# Patient Record
Sex: Female | Born: 1950 | Race: White | Hispanic: No | Marital: Married | Smoking: Current every day smoker
Health system: Southern US, Community
[De-identification: ages and names within clinical notes are randomized; demographics above are authoritative.]

## PROBLEM LIST (undated history)

## (undated) DIAGNOSIS — K219 Gastro-esophageal reflux disease without esophagitis: Secondary | ICD-10-CM

## (undated) DIAGNOSIS — J45909 Unspecified asthma, uncomplicated: Secondary | ICD-10-CM

## (undated) DIAGNOSIS — IMO0001 Reserved for inherently not codable concepts without codable children: Secondary | ICD-10-CM

## (undated) DIAGNOSIS — F32A Depression, unspecified: Secondary | ICD-10-CM

## (undated) DIAGNOSIS — Z9981 Dependence on supplemental oxygen: Secondary | ICD-10-CM

## (undated) DIAGNOSIS — I509 Heart failure, unspecified: Secondary | ICD-10-CM

## (undated) DIAGNOSIS — G35 Multiple sclerosis: Secondary | ICD-10-CM

## (undated) DIAGNOSIS — I10 Essential (primary) hypertension: Secondary | ICD-10-CM

## (undated) DIAGNOSIS — M549 Dorsalgia, unspecified: Secondary | ICD-10-CM

## (undated) DIAGNOSIS — M542 Cervicalgia: Secondary | ICD-10-CM

## (undated) DIAGNOSIS — F329 Major depressive disorder, single episode, unspecified: Secondary | ICD-10-CM

## (undated) DIAGNOSIS — J449 Chronic obstructive pulmonary disease, unspecified: Secondary | ICD-10-CM

## (undated) DIAGNOSIS — G4730 Sleep apnea, unspecified: Secondary | ICD-10-CM

## (undated) DIAGNOSIS — G8929 Other chronic pain: Secondary | ICD-10-CM

## (undated) DIAGNOSIS — F0390 Unspecified dementia without behavioral disturbance: Secondary | ICD-10-CM

## (undated) DIAGNOSIS — F419 Anxiety disorder, unspecified: Secondary | ICD-10-CM

## (undated) HISTORY — PX: BLADDER SURGERY: SHX569

## (undated) HISTORY — PX: SHOULDER SURGERY: SHX246

## (undated) HISTORY — PX: HEMORRHOID SURGERY: SHX153

## (undated) HISTORY — PX: CARPAL TUNNEL RELEASE: SHX101

## (undated) HISTORY — DX: Unspecified dementia, unspecified severity, without behavioral disturbance, psychotic disturbance, mood disturbance, and anxiety: F03.90

## (undated) HISTORY — PX: BREAST SURGERY: SHX581

## (undated) HISTORY — DX: Multiple sclerosis: G35

## (undated) HISTORY — PX: CHOLECYSTECTOMY: SHX55

## (undated) HISTORY — PX: ABDOMINAL HYSTERECTOMY: SHX81

## (undated) HISTORY — PX: NECK SURGERY: SHX720

---

## 1999-03-23 ENCOUNTER — Ambulatory Visit: Admission: RE | Admit: 1999-03-23 | Discharge: 1999-03-23 | Payer: Self-pay | Admitting: Pulmonary Disease

## 2000-02-29 ENCOUNTER — Ambulatory Visit (HOSPITAL_BASED_OUTPATIENT_CLINIC_OR_DEPARTMENT_OTHER): Admission: RE | Admit: 2000-02-29 | Discharge: 2000-02-29 | Payer: Self-pay | Admitting: Unknown Physician Specialty

## 2000-05-02 ENCOUNTER — Ambulatory Visit (HOSPITAL_BASED_OUTPATIENT_CLINIC_OR_DEPARTMENT_OTHER): Admission: RE | Admit: 2000-05-02 | Discharge: 2000-05-02 | Payer: Self-pay | Admitting: Unknown Physician Specialty

## 2000-05-09 ENCOUNTER — Encounter: Admission: RE | Admit: 2000-05-09 | Discharge: 2000-05-09 | Payer: Self-pay | Admitting: Orthopedic Surgery

## 2000-05-11 ENCOUNTER — Ambulatory Visit (HOSPITAL_BASED_OUTPATIENT_CLINIC_OR_DEPARTMENT_OTHER): Admission: RE | Admit: 2000-05-11 | Discharge: 2000-05-11 | Payer: Self-pay | Admitting: Orthopedic Surgery

## 2000-05-31 ENCOUNTER — Encounter: Admission: RE | Admit: 2000-05-31 | Discharge: 2000-06-08 | Payer: Self-pay | Admitting: Orthopedic Surgery

## 2006-09-21 ENCOUNTER — Ambulatory Visit (HOSPITAL_COMMUNITY): Admission: RE | Admit: 2006-09-21 | Discharge: 2006-09-21 | Payer: Self-pay | Admitting: General Surgery

## 2007-01-25 ENCOUNTER — Ambulatory Visit (HOSPITAL_COMMUNITY): Admission: RE | Admit: 2007-01-25 | Discharge: 2007-01-25 | Payer: Self-pay | Admitting: Family Medicine

## 2007-09-14 HISTORY — PX: ESOPHAGOGASTRODUODENOSCOPY: SHX1529

## 2007-10-04 ENCOUNTER — Ambulatory Visit (HOSPITAL_COMMUNITY): Admission: RE | Admit: 2007-10-04 | Discharge: 2007-10-04 | Payer: Self-pay | Admitting: Family Medicine

## 2008-02-02 ENCOUNTER — Encounter: Admission: RE | Admit: 2008-02-02 | Discharge: 2008-02-02 | Payer: Self-pay | Admitting: Neurosurgery

## 2008-03-12 ENCOUNTER — Ambulatory Visit (HOSPITAL_COMMUNITY): Admission: RE | Admit: 2008-03-12 | Discharge: 2008-03-12 | Payer: Self-pay | Admitting: Orthopedic Surgery

## 2008-04-01 ENCOUNTER — Encounter: Admission: RE | Admit: 2008-04-01 | Discharge: 2008-04-25 | Payer: Self-pay | Admitting: Orthopedic Surgery

## 2008-11-27 ENCOUNTER — Inpatient Hospital Stay (HOSPITAL_COMMUNITY): Admission: RE | Admit: 2008-11-27 | Discharge: 2008-11-27 | Payer: Self-pay | Admitting: Neurosurgery

## 2009-09-16 ENCOUNTER — Emergency Department (HOSPITAL_COMMUNITY): Admission: EM | Admit: 2009-09-16 | Discharge: 2009-09-16 | Payer: Self-pay | Admitting: Emergency Medicine

## 2010-10-04 ENCOUNTER — Encounter: Payer: Self-pay | Admitting: General Surgery

## 2010-11-29 LAB — BASIC METABOLIC PANEL
Chloride: 100 mEq/L (ref 96–112)
Creatinine, Ser: 0.78 mg/dL (ref 0.4–1.2)
GFR calc non Af Amer: >60 mL/min (ref >60)
Potassium: 3.8 mEq/L (ref 3.5–5.1)

## 2010-11-29 LAB — CBC
HCT: 41.7 % (ref 36.0–46.0)
Platelets: 201 10*3/uL (ref 150–400)
RDW: 14.2 % (ref 11.5–15.5)
WBC: 10.2 10*3/uL (ref 4.0–10.5)

## 2010-11-29 LAB — POCT CARDIAC MARKERS
CKMB, poc: <1 ng/mL — ABNORMAL LOW (ref 1.0–8.0)
Myoglobin, poc: 76.1 ng/mL (ref 12–200)
Troponin i, poc: <0.05 ng/mL (ref 0.00–0.09)

## 2010-11-29 LAB — DIFFERENTIAL
Basophils Absolute: 0 10*3/uL (ref 0.0–0.1)
Eosinophils Relative: 0 % (ref 0–5)
Lymphocytes Relative: 11 % — ABNORMAL LOW (ref 12–46)
Lymphs Abs: 1.1 10*3/uL (ref 0.7–4.0)
Neutro Abs: 8.4 10*3/uL — ABNORMAL HIGH (ref 1.7–7.7)

## 2010-12-24 LAB — BASIC METABOLIC PANEL
CO2: 29 mEq/L (ref 19–32)
Calcium: 9.5 mg/dL (ref 8.4–10.5)
Creatinine, Ser: 0.89 mg/dL (ref 0.4–1.2)
GFR calc non Af Amer: >60 mL/min (ref >60)
Sodium: 141 mEq/L (ref 135–145)

## 2010-12-24 LAB — CBC
MCV: 88.6 fL (ref 78.0–100.0)
Platelets: 184 10*3/uL (ref 150–400)
RBC: 4.85 MIL/uL (ref 3.87–5.11)
WBC: 5.3 10*3/uL (ref 4.0–10.5)

## 2011-01-26 NOTE — Op Note (Signed)
Olivia Randall, Olivia Randall                ACCOUNT NO.:  [id number]   MEDICAL RECORD NO.:  [id number]          PATIENT TYPE:  INP   LOCATION:  3533                         FACILITY:  [facility]   PHYSICIAN:  Coletta Memos, M.D.     DATE OF BIRTH:  1951/06/21   DATE OF PROCEDURE:  11/27/2008  DATE OF DISCHARGE:  11/27/2008                               OPERATIVE REPORT   PREOPERATIVE DIAGNOSES:  1. Cervical spondylosis, C5-6 and C6-7.  2. Cervical stenosis, C5-6 and C6-7.  3. Neck pain.   POSTOPERATIVE DIAGNOSES:  1. Cervical spondylosis, C5-6 and C6-7.  2. Cervical stenosis, C5-6 and C6-7.  3. Neck pain.   PROCEDURES:  1. Anterior cervical decompression, C5-6 and C6-7.  2. Arthrodesis, C5-6 and C6-7, 6-mm structural allograft placed at      both levels.  3. Anterior instrumentation, 30-mm Vectra plate placed at C5-C7, 2      screws at C5, 2 screws at C6, 2 screws at C7.   COMPLICATIONS:  None.   SURGEON:  Coletta Memos, MD   ASSISTANT:  Dr. Lovell Sheehan   INDICATIONS:  Olivia Randall presented with significant neck pain.  She  also had shoulder pain.  She has undergone a shoulder surgery and does  have some improvement.  She still has marked degenerative change at the  disk space at 5-6 and 6-7, and I have offered and she has agreed to  undergo operative decompression.   OPERATIVE NOTE:  Olivia Randall was brought to the operating room,  intubated, and placed under a general anesthetic without difficulty.  She was positioned with her head essentially in neutral position on  horseshoe headrest.  Her neck was prepped, and she was draped in a  sterile fashion.  I infiltrated 5 mL of 0.5% lidocaine with 1:200,000  epinephrine into the subcutaneous tissue starting from the midline  extending to the medial border of the left sternocleidomastoid at the  cricothyroid membrane level.  I opened the skin with a #10 blade, and I  took this initial incision down to the platysma.  I dissected rostrally  and  caudally in a plane superior to the platysma.  I divided the  platysma sharply using Metzenbaum scissors.  I then was able to identify  the medial strap muscles and the sternocleidomastoid.  With sharp and  blunt dissection, I created an avascular corridor to the cervical spine.  I placed spinal needle and it was located at the disk space between C5  and C6.  I then reflected the longus colli muscles bilaterally and  placed self-retaining retractors.  I then proceeded by placing  distraction pins, 1 at C6, 1 at C5, and distracted the disk space.  I  then proceeded with my anterior cervical decompression.   I decompressed the spinal canal first by using high-speed drill to  remove what were large in significant osteophytes at both C5 and C6.  I  drilled for quite some time in addition using a hook.  The ligament was  partially calcified redundant and extraordinarily thick.  Once I was  able to get under that  using a Kerrison punch, I removed it to expose  the thecal sac, but I used a drill more to make sure I removed the  uncovertebral hypertrophy and bone overlying the C6 nerve roots  bilaterally.  Once that was done bilaterally, I felt that I had very  good decompression.  Dr. Lovell Sheehan and I then prepared for arthrodesis.  I  used a drill to even the surfaces at C5 and C6.  I sized the space and  felt a 6-mm structural allograft would be appropriate.  I placed the  graft and then proceeded to decompress the C6-7 level.   I removed the distraction pin from C5 and placed it into the vertebral  body of C7.  I had already opened the disk space.  I then distracted the  disk space.  I again using Kerrison punches, pituitary rongeurs, and a  high-speed drill removed osteophytes at C6 and C7 overlying the spinal  canal and nerve roots.  The left side of C6-7 uncovertebral joint was  significantly hypertrophied, and I was able used to drill and completely  remove that freeing up the nerve root  and its egress from the spinal  canal.  I irrigated the wound.  I then prepared for arthrodesis by using  a high-speed drill at C6 and at C7.  I sized the space and felt again a  6-mm graft would be appropriate.  I placed a graft, and then turned to  instrumentation.   A 30-mm Vectra plate was placed, and this was done by the first drilling  holes and using self-tapping screws at C5, C6, and C7.  Postoperative x-  ray showed the plate, plugs, and screws to be at the correct levels and  in apparent good position.  I then irrigated the wound.  I then closed  the wound in layered fashion using Vicryl sutures to reapproximate the  platysma with subcuticular layers.  I used Dermabond for sterile  dressing.  The patient was extubated.           ______________________________  Coletta Memos, M.D.     KC/MEDQ  D:  11/27/2008  T:  11/28/2008  Job:  [id number]

## 2011-01-26 NOTE — Op Note (Signed)
NAMEMIRHA, BRUCATO                ACCOUNT NO.:  [id number]   MEDICAL RECORD NO.:  [id number]          PATIENT TYPE:  AMB   LOCATION:  SDS                          FACILITY:  [facility]   PHYSICIAN:  Mila Homer. Sherlean Foot, M.D. DATE OF BIRTH:  1951/03/20   DATE OF PROCEDURE:  03/12/2008  DATE OF DISCHARGE:  03/12/2008                               OPERATIVE REPORT   SURGEON:  Mila Homer. Sherlean Foot, MD   ASSISTANT:  None.   ANESTHESIA:  General.   PREOPERATIVE DIAGNOSIS:  Right shoulder impingement syndrome.   POSTOPERATIVE DIAGNOSIS:  Right shoulder impingement syndrome.   PROCEDURE:  Right shoulder arthroscopy, subacromial decompression,  distal clavicle resection.   INDICATIONS FOR PROCEDURE:  The patient is 61 year old white female with  failure of conservative measures from impingement of the right shoulder.  Informed consent was obtained.   DESCRIPTION OF PROCEDURE:  The patient was laid supine.  Under general  anesthesia, placed in beach-chair position.  The right shoulder was  prepped and draped in usual sterile fashion.  Anterior and posterior  direct lateral portals were created with #11 blade, blunt trocar, and  cannula.  Diagnostic arthroscopy revealed no chondromalacia in the  shoulder joint, Beaufort complex anteriorly, but no pathologic labral  tearing.  We then went to the subacromial space.  I then performed  anterolateral acromioplasty with a 4.0-mm cylindrical bur.  We performed  distal clavicle resection with a 4.0-mm cylindrical bur.  We debrided  the rotator cuff.  There was some evidence of partial-thickness tearing  only.  CA ligament was released with the ArthroCare debridement wand.  Then, evacuated the subacromial space, closed the portals with 4-0 nylon  sutures.  Dressed with Xeroform dressing, sponges, Hypafix tape, and  simple sling.   COMPLICATIONS:  None.   DRAINS:  None.           ______________________________  Mila Homer. Sherlean Foot, M.D.     SDL/MEDQ  D:  03/12/2008  T:  03/12/2008  Job:  [id number]

## 2011-06-10 LAB — COMPREHENSIVE METABOLIC PANEL
ALT: 17
AST: 19
CO2: 32
Calcium: 9.4
Chloride: 105
Creatinine, Ser: 0.97
GFR calc non Af Amer: 59 — ABNORMAL LOW
Glucose, Bld: 103 — ABNORMAL HIGH
Total Bilirubin: 0.5

## 2011-06-10 LAB — DIFFERENTIAL
Basophils Absolute: 0
Basophils Relative: 0
Eosinophils Relative: 2
Monocytes Absolute: 0.5
Neutro Abs: 5.4

## 2011-06-10 LAB — URINALYSIS, ROUTINE W REFLEX MICROSCOPIC
Bilirubin Urine: NEGATIVE
Glucose, UA: NEGATIVE
Ketones, ur: NEGATIVE
Protein, ur: NEGATIVE
pH: 6

## 2011-06-10 LAB — CBC
HCT: 42.9
MCV: 91.6
Platelets: 198
RBC: 4.68
WBC: 8.2

## 2011-06-10 LAB — PROTIME-INR: Prothrombin Time: 12.1

## 2011-09-10 ENCOUNTER — Other Ambulatory Visit: Payer: Self-pay | Admitting: Neurosurgery

## 2011-09-10 DIAGNOSIS — M5126 Other intervertebral disc displacement, lumbar region: Secondary | ICD-10-CM

## 2011-09-10 DIAGNOSIS — M5410 Radiculopathy, site unspecified: Secondary | ICD-10-CM

## 2011-09-13 ENCOUNTER — Ambulatory Visit
Admission: RE | Admit: 2011-09-13 | Discharge: 2011-09-13 | Disposition: A | Discharge status: Home / Self Care | Payer: Medicare Other | Source: Ambulatory Visit | Attending: Neurosurgery | Admitting: Neurosurgery

## 2011-09-13 DIAGNOSIS — M5126 Other intervertebral disc displacement, lumbar region: Secondary | ICD-10-CM

## 2011-09-13 DIAGNOSIS — M5410 Radiculopathy, site unspecified: Secondary | ICD-10-CM

## 2011-09-13 MED ORDER — DIAZEPAM 5 MG PO TABS
10.0000 mg | ORAL_TABLET | Freq: Once | ORAL | Status: AC
  Administered 2011-09-13: 10 mg via ORAL

## 2011-09-13 MED ORDER — ONDANSETRON HCL 4 MG/2ML IJ SOLN
4.0000 mg | Freq: Once | INTRAMUSCULAR | Status: AC
  Administered 2011-09-13: 4 mg via INTRAMUSCULAR

## 2011-09-13 MED ORDER — MORPHINE SULFATE 4 MG/ML IJ SOLN
4.0000 mg | Freq: Once | INTRAMUSCULAR | Status: AC
  Administered 2011-09-13: 4 mg via INTRAMUSCULAR

## 2011-09-13 MED ORDER — IOHEXOL 180 MG/ML  SOLN
18.0000 mL | Freq: Once | INTRAMUSCULAR | Status: AC | PRN
  Administered 2011-09-13: 18 mL via INTRATHECAL

## 2011-09-13 NOTE — Patient Instructions (Signed)

## 2011-09-13 NOTE — Progress Notes (Signed)
Husband and grand-daughter at bedside after patient medicated for pain.  Patient resting comfortably at present.  jkl

## 2012-02-28 ENCOUNTER — Emergency Department (HOSPITAL_COMMUNITY)
Admission: EM | Admit: 2012-02-28 | Discharge: 2012-02-28 | Disposition: A | Discharge status: Home / Self Care | Payer: Medicare Other | Attending: Emergency Medicine | Admitting: Emergency Medicine

## 2012-02-28 ENCOUNTER — Emergency Department (HOSPITAL_COMMUNITY): Payer: Medicare Other

## 2012-02-28 ENCOUNTER — Encounter (HOSPITAL_COMMUNITY): Payer: Self-pay | Admitting: *Deleted

## 2012-02-28 DIAGNOSIS — F17200 Nicotine dependence, unspecified, uncomplicated: Secondary | ICD-10-CM | POA: Insufficient documentation

## 2012-02-28 DIAGNOSIS — I10 Essential (primary) hypertension: Secondary | ICD-10-CM | POA: Insufficient documentation

## 2012-02-28 DIAGNOSIS — R0602 Shortness of breath: Secondary | ICD-10-CM | POA: Insufficient documentation

## 2012-02-28 DIAGNOSIS — G4730 Sleep apnea, unspecified: Secondary | ICD-10-CM | POA: Insufficient documentation

## 2012-02-28 DIAGNOSIS — J4489 Other specified chronic obstructive pulmonary disease: Secondary | ICD-10-CM | POA: Insufficient documentation

## 2012-02-28 DIAGNOSIS — J449 Chronic obstructive pulmonary disease, unspecified: Secondary | ICD-10-CM

## 2012-02-28 HISTORY — DX: Depression, unspecified: F32.A

## 2012-02-28 HISTORY — DX: Chronic obstructive pulmonary disease, unspecified: J44.9

## 2012-02-28 HISTORY — DX: Sleep apnea, unspecified: G47.30

## 2012-02-28 HISTORY — DX: Major depressive disorder, single episode, unspecified: F32.9

## 2012-02-28 HISTORY — DX: Essential (primary) hypertension: I10

## 2012-02-28 MED ORDER — PREDNISONE 50 MG PO TABS: 50.0000 mg | ORAL_TABLET | Freq: Every day | ORAL | Status: AC

## 2012-02-28 MED ORDER — PREDNISONE 20 MG PO TABS
60.0000 mg | ORAL_TABLET | Freq: Once | ORAL | Status: AC
  Administered 2012-02-28: 60 mg via ORAL
  Filled 2012-02-28: qty 3

## 2012-02-28 MED ORDER — ALBUTEROL SULFATE (5 MG/ML) 0.5% IN NEBU
5.0000 mg | INHALATION_SOLUTION | Freq: Once | RESPIRATORY_TRACT | Status: AC
  Administered 2012-02-28: 5 mg via RESPIRATORY_TRACT
  Filled 2012-02-28: qty 1

## 2012-02-28 MED ORDER — IPRATROPIUM BROMIDE 0.02 % IN SOLN
0.5000 mg | Freq: Once | RESPIRATORY_TRACT | Status: AC
  Administered 2012-02-28: 0.5 mg via RESPIRATORY_TRACT
  Filled 2012-02-28: qty 2.5

## 2012-02-28 NOTE — ED Notes (Signed)
Sob for 3-4 days, worse this am.  0n 02. Cough, ? Fever.

## 2012-02-28 NOTE — Discharge Instructions (Signed)
Chronic Obstructive Pulmonary Disease Chronic obstructive pulmonary disease (COPD) is a lung disease. The lungs become damaged, making it hard to get air in and out of your lungs. The damage to your lungs cannot be changed.  HOME CARE  Stop smoking if you smoke. Avoid secondhand smoke.   Only take medicine as told by your doctor.   Talk to your doctor about using cough syrup or over-the-counter medicines.   Drink enough fluids to keep your pee (urine) clear or pale yellow.   Use a humidifier or vaporizer. This may help loosen the thick spit (mucus).   Talk to your doctor about vaccines that help prevent other lung problems (pneumonia and flu vaccines).   Use home oxygen as told by your doctor.   Stay active and exercise.   Eat healthy foods.  GET HELP RIGHT AWAY IF:   Your heart is beating fast.   You become disturbed, confused, shake, or are dazed.   You have trouble breathing.   You have chest pain.   You have a fever.   You cough up thick spit that is yellowish-white or green.   Your breathing becomes worse when you exercise.   You are running out of the medicine you take for your breathing.  MAKE SURE YOU:   Understand these instructions.   Will watch your condition.   Will get help right away if you are not doing well or get worse.  Document Released: 02/16/2008 Document Revised: 08/19/2011 Document Reviewed: 10/30/2010 [facility] Patient Information 2012 [place], [place]   Stop smoking.  Chest x-ray shows no pneumonia. Continue breathing treatments. Add prednisone for one week.  Followup your primary care doctor

## 2012-02-28 NOTE — ED Notes (Signed)
Lt foot pain

## 2012-02-28 NOTE — ED Provider Notes (Signed)
History   This chart was scribed for Donnetta Hutching, MD by Toya Smothers. The patient was seen in room APA18/APA18. Patient's care was started at 1516.  CSN: [id number]  Arrival date & time 02/28/12  1516   First MD Initiated Contact with Patient 02/28/12 1602      Chief Complaint  Patient presents with  . Shortness of Breath   HPI  Olivia Randall is a 61 y.o. female who presents to the Emergency Department complaining of sudden onset moderate severe constant SOB. Pt states that she has a h/o COPD, sleep apnea, and is a current everyday smoker, although in the last 3 days she has been experiencing increased SOB and cough, even with the use of 2 liter home oxygen and breathing treatment once every 4 hours. Pt denies having fever.  Pt litst PCP as Dr. Prudy Feeler at [facility]  Past Medical History  Diagnosis Date  . COPD (chronic obstructive pulmonary disease)   . Hypertension   . Sleep apnea   . Depression     Past Surgical History  Procedure Date  . Abdominal hysterectomy   . Bladder surgery   . Breast surgery   . Hemorrhoid surgery   . Carpal tunnel release     History reviewed. No pertinent family history.  History  Substance Use Topics  . Smoking status: Current Everyday Smoker  . Smokeless tobacco: Not on file  . Alcohol Use: No   Review of Systems  Constitutional: Negative for fever.  HENT: Negative for rhinorrhea.   Eyes: Negative for pain.  Respiratory: Positive for cough and shortness of breath.   Cardiovascular: Negative for chest pain.  Gastrointestinal: Negative for nausea, vomiting, abdominal pain and diarrhea.  Genitourinary: Negative for dysuria.  Musculoskeletal: Negative for back pain.  Skin: Negative for rash.  Neurological: Negative for weakness and headaches.    Allergies  Review of patient's allergies indicates no known allergies.  Home Medications   Current Outpatient Rx  Name Route Sig Dispense Refill  . ALBUTEROL SULFATE HFA 108 (90  BASE) MCG/ACT IN AERS Inhalation Inhale 2 puffs into the lungs every 6 (six) hours as needed.    . ALBUTEROL SULFATE (2.5 MG/3ML) 0.083% IN NEBU Nebulization Take 2.5 mg by nebulization every 6 (six) hours as needed.    . ALPRAZOLAM 0.5 MG PO TABS Oral Take 0.5 mg by mouth 3 (three) times daily as needed.    . CYCLOBENZAPRINE HCL 10 MG PO TABS Oral Take 10 mg by mouth 3 (three) times daily as needed.    . DONEPEZIL HCL 10 MG PO TABS Oral Take 10 mg by mouth at bedtime.    . FUROSEMIDE 20 MG PO TABS Oral Take 20 mg by mouth daily.    [name] HYDROCODONE-ACETAMINOPHEN 10-325 MG PO TABS Oral Take 1 tablet by mouth every 6 (six) hours as needed.    [name] HYDROXYZINE PAMOATE PO Oral Take 1 tablet by mouth 3 (three) times daily as needed.    [name] LORATADINE 10 MG PO TABS Oral Take 10 mg by mouth daily.    [name] OMEPRAZOLE 20 MG PO CPDR Oral Take 20 mg by mouth daily.    [name] PAROXETINE HCL 40 MG PO TABS Oral Take 40 mg by mouth every morning.    [name] PSEUDOEPH-DOXYLAMINE-DM-APAP 60-7.02-09-999 MG/30ML PO LIQD Oral Take 15 mLs by mouth as needed. For coughing      BP 142/102  Pulse 90  Temp 98.3 F (36.8 C) (Oral)  Resp 24  Ht  5\' 4"  (1.626 m)  Wt 156 lb (70.761 kg)  BMI 26.78 kg/m2  SpO2 98%  Physical Exam  Nursing note and vitals reviewed. Constitutional: She is oriented to person, place, and time. She appears well-developed and well-nourished. No distress.  HENT:  Head: Normocephalic and atraumatic.  Eyes: EOM are normal. Pupils are equal, round, and reactive to light.  Neck: Neck supple. No tracheal deviation present.  Cardiovascular: Normal rate.   Pulmonary/Chest: Effort normal. No respiratory distress. She has no wheezes.  Abdominal: Soft. She exhibits no distension.       Bilateral lower wall tenderness.  Musculoskeletal: Normal range of motion. She exhibits no edema.       Slightly dyskinetic. Mild tenderness of L Lower Left Foot.  Neurological: She is alert and oriented to person, place, and time. No  sensory deficit.  Skin: Skin is warm and dry.  Psychiatric: She has a normal mood and affect. Her behavior is normal.    ED Course  Procedures (including critical care time) DIAGNOSTIC STUDIES: Oxygen Saturation is 98% on [place], normal by my interpretation.    COORDINATION OF CARE: 1627- Ordered L foot x-ray. Ordered lung x-ray.  Labs Reviewed - No data to display Dg Chest 2 View  02/28/2012  *RADIOLOGY REPORT*  Clinical Data: Hypertension and shortness of breath.  COPD.  CHEST - 2 VIEW  Comparison: PA and lateral chest 07/20/2011.  Findings: The lungs are emphysematous with unchanged right basilar scarring.  No pneumothorax or pleural effusion.  Heart size normal.  IMPRESSION: Emphysema and right basilar scar.  No acute abnormality.  Original Report Authenticated By: Bernadene Bell. D'ALESSIO, M.D.   Dg Foot Complete Left  02/28/2012  *RADIOLOGY REPORT*  Clinical Data: Left foot pain.  LEFT FOOT - COMPLETE 3+ VIEW  Comparison: None  Findings: The joint spaces are maintained.  No acute fracture.  No significant degenerative changes.  Moderate pes cavus deformity.  IMPRESSION: No acute bony findings.  Original Report Authenticated By: P. Loralie Champagne, M.D.     No diagnosis found.    MDM  Feeling better after breathing treatment. We'll start prednisone. Patient has COPD and continues to smoke.  She is hemodynamically stable at discharge    I personally performed the services described in this documentation, which was scribed in my presence. The recorded information has been reviewed and considered.    Donnetta Hutching, MD 02/28/12 [phone]

## 2012-12-03 ENCOUNTER — Emergency Department (HOSPITAL_COMMUNITY): Payer: Medicare Other

## 2012-12-03 ENCOUNTER — Encounter (HOSPITAL_COMMUNITY): Payer: Self-pay

## 2012-12-03 ENCOUNTER — Inpatient Hospital Stay (HOSPITAL_COMMUNITY)
Admission: EM | Admit: 2012-12-03 | Discharge: 2012-12-05 | DRG: 191 | Disposition: A | Discharge status: Home / Self Care | Payer: Medicare Other | Attending: Family Medicine | Admitting: Family Medicine

## 2012-12-03 DIAGNOSIS — R42 Dizziness and giddiness: Secondary | ICD-10-CM

## 2012-12-03 DIAGNOSIS — M549 Dorsalgia, unspecified: Secondary | ICD-10-CM | POA: Diagnosis present

## 2012-12-03 DIAGNOSIS — F329 Major depressive disorder, single episode, unspecified: Secondary | ICD-10-CM | POA: Diagnosis present

## 2012-12-03 DIAGNOSIS — G4730 Sleep apnea, unspecified: Secondary | ICD-10-CM | POA: Diagnosis present

## 2012-12-03 DIAGNOSIS — F3289 Other specified depressive episodes: Secondary | ICD-10-CM | POA: Diagnosis present

## 2012-12-03 DIAGNOSIS — E876 Hypokalemia: Secondary | ICD-10-CM | POA: Diagnosis present

## 2012-12-03 DIAGNOSIS — J441 Chronic obstructive pulmonary disease with (acute) exacerbation: Principal | ICD-10-CM | POA: Diagnosis present

## 2012-12-03 DIAGNOSIS — F411 Generalized anxiety disorder: Secondary | ICD-10-CM | POA: Diagnosis present

## 2012-12-03 DIAGNOSIS — Z9981 Dependence on supplemental oxygen: Secondary | ICD-10-CM

## 2012-12-03 DIAGNOSIS — K219 Gastro-esophageal reflux disease without esophagitis: Secondary | ICD-10-CM

## 2012-12-03 DIAGNOSIS — J9610 Chronic respiratory failure, unspecified whether with hypoxia or hypercapnia: Secondary | ICD-10-CM | POA: Diagnosis present

## 2012-12-03 DIAGNOSIS — Z833 Family history of diabetes mellitus: Secondary | ICD-10-CM

## 2012-12-03 DIAGNOSIS — F17200 Nicotine dependence, unspecified, uncomplicated: Secondary | ICD-10-CM | POA: Diagnosis present

## 2012-12-03 DIAGNOSIS — I10 Essential (primary) hypertension: Secondary | ICD-10-CM | POA: Diagnosis present

## 2012-12-03 DIAGNOSIS — G8929 Other chronic pain: Secondary | ICD-10-CM | POA: Diagnosis present

## 2012-12-03 DIAGNOSIS — Z79899 Other long term (current) drug therapy: Secondary | ICD-10-CM

## 2012-12-03 HISTORY — DX: Cervicalgia: M54.2

## 2012-12-03 HISTORY — DX: Anxiety disorder, unspecified: F41.9

## 2012-12-03 HISTORY — DX: Dependence on supplemental oxygen: Z99.81

## 2012-12-03 HISTORY — DX: Gastro-esophageal reflux disease without esophagitis: K21.9

## 2012-12-03 HISTORY — DX: Major depressive disorder, single episode, unspecified: F32.9

## 2012-12-03 HISTORY — DX: Other chronic pain: G89.29

## 2012-12-03 HISTORY — DX: Dorsalgia, unspecified: M54.9

## 2012-12-03 HISTORY — DX: Depression, unspecified: F32.A

## 2012-12-03 LAB — CBC WITH DIFFERENTIAL/PLATELET
Basophils Absolute: 0 10*3/uL (ref 0.0–0.1)
Lymphocytes Relative: 20 % (ref 12–46)
Neutro Abs: 4.7 10*3/uL (ref 1.7–7.7)
Platelets: 232 10*3/uL (ref 150–400)
RBC: 4.45 MIL/uL (ref 3.87–5.11)
RDW: 13.9 % (ref 11.5–15.5)
WBC: 7 10*3/uL (ref 4.0–10.5)

## 2012-12-03 LAB — BASIC METABOLIC PANEL
Calcium: 9.7 mg/dL (ref 8.4–10.5)
Chloride: 98 mEq/L (ref 96–112)
Creatinine, Ser: 0.82 mg/dL (ref 0.50–1.10)
GFR calc Af Amer: 86 mL/min — ABNORMAL LOW (ref >90)
Sodium: 141 mEq/L (ref 135–145)

## 2012-12-03 LAB — TROPONIN I: Troponin I: <0.3 ng/mL (ref <0.30)

## 2012-12-03 MED ORDER — SODIUM CHLORIDE 0.9 % IJ SOLN: 3.0000 mL | INTRAMUSCULAR | Status: DC | PRN

## 2012-12-03 MED ORDER — IPRATROPIUM BROMIDE 0.02 % IN SOLN
1.0000 mg | Freq: Once | RESPIRATORY_TRACT | Status: AC
Start: 2012-12-03 — End: 2012-12-03
  Administered 2012-12-03: 1 mg via RESPIRATORY_TRACT
  Filled 2012-12-03: qty 5

## 2012-12-03 MED ORDER — ONDANSETRON HCL 4 MG/2ML IJ SOLN: 4.0000 mg | Freq: Four times a day (QID) | INTRAMUSCULAR | Status: DC | PRN

## 2012-12-03 MED ORDER — FUROSEMIDE 20 MG PO TABS
20.0000 mg | ORAL_TABLET | Freq: Every day | ORAL | Status: DC
  Administered 2012-12-04 – 2012-12-05 (×2): 20 mg via ORAL
  Filled 2012-12-03 (×2): qty 1

## 2012-12-03 MED ORDER — ALBUTEROL SULFATE (5 MG/ML) 0.5% IN NEBU
2.5000 mg | INHALATION_SOLUTION | RESPIRATORY_TRACT | Status: DC
  Administered 2012-12-03 – 2012-12-04 (×5): 2.5 mg via RESPIRATORY_TRACT
  Filled 2012-12-03 (×5): qty 0.5

## 2012-12-03 MED ORDER — SODIUM CHLORIDE 0.9 % IJ SOLN
3.0000 mL | Freq: Two times a day (BID) | INTRAMUSCULAR | Status: DC
  Administered 2012-12-04 – 2012-12-05 (×2): 3 mL via INTRAVENOUS

## 2012-12-03 MED ORDER — LORATADINE 10 MG PO TABS
10.0000 mg | ORAL_TABLET | Freq: Every morning | ORAL | Status: DC
  Administered 2012-12-04 – 2012-12-05 (×2): 10 mg via ORAL
  Filled 2012-12-03 (×2): qty 1

## 2012-12-03 MED ORDER — ALBUTEROL SULFATE (5 MG/ML) 0.5% IN NEBU
15.0000 mg | INHALATION_SOLUTION | Freq: Once | RESPIRATORY_TRACT | Status: AC
  Administered 2012-12-03: 15 mg via RESPIRATORY_TRACT
  Filled 2012-12-03 (×2): qty 3

## 2012-12-03 MED ORDER — ENOXAPARIN SODIUM 40 MG/0.4ML ~~LOC~~ SOLN
40.0000 mg | SUBCUTANEOUS | Status: DC
  Administered 2012-12-03 – 2012-12-05 (×2): 40 mg via SUBCUTANEOUS
  Filled 2012-12-03 (×2): qty 0.4

## 2012-12-03 MED ORDER — LEVOFLOXACIN IN D5W 750 MG/150ML IV SOLN
750.0000 mg | INTRAVENOUS | Status: DC
  Administered 2012-12-03 – 2012-12-04 (×2): 750 mg via INTRAVENOUS
  Filled 2012-12-03 (×2): qty 150

## 2012-12-03 MED ORDER — ACETAMINOPHEN 325 MG PO TABS
650.0000 mg | ORAL_TABLET | Freq: Four times a day (QID) | ORAL | Status: DC | PRN
  Administered 2012-12-04: 650 mg via ORAL
  Filled 2012-12-03: qty 2

## 2012-12-03 MED ORDER — ALUM & MAG HYDROXIDE-SIMETH 200-200-20 MG/5ML PO SUSP: 30.0000 mL | Freq: Four times a day (QID) | ORAL | Status: DC | PRN

## 2012-12-03 MED ORDER — METHYLPREDNISOLONE SODIUM SUCC 40 MG IJ SOLR
40.0000 mg | Freq: Four times a day (QID) | INTRAMUSCULAR | Status: DC
  Administered 2012-12-03 – 2012-12-05 (×7): 40 mg via INTRAVENOUS
  Filled 2012-12-03 (×7): qty 1

## 2012-12-03 MED ORDER — PAROXETINE HCL 20 MG PO TABS
40.0000 mg | ORAL_TABLET | Freq: Every day | ORAL | Status: DC
Start: 2012-12-04 — End: 2012-12-05
  Administered 2012-12-04 – 2012-12-05 (×2): 40 mg via ORAL
  Filled 2012-12-03 (×2): qty 2

## 2012-12-03 MED ORDER — METHYLPREDNISOLONE SODIUM SUCC 125 MG IJ SOLR
125.0000 mg | Freq: Once | INTRAMUSCULAR | Status: AC
  Administered 2012-12-03: 125 mg via INTRAVENOUS
  Filled 2012-12-03: qty 2

## 2012-12-03 MED ORDER — PANTOPRAZOLE SODIUM 40 MG PO TBEC
40.0000 mg | DELAYED_RELEASE_TABLET | Freq: Every day | ORAL | Status: DC
  Administered 2012-12-04 – 2012-12-05 (×2): 40 mg via ORAL
  Filled 2012-12-03 (×2): qty 1

## 2012-12-03 MED ORDER — POTASSIUM CHLORIDE CRYS ER 20 MEQ PO TBCR
40.0000 meq | EXTENDED_RELEASE_TABLET | Freq: Once | ORAL | Status: AC
  Administered 2012-12-03: 40 meq via ORAL
  Filled 2012-12-03: qty 2

## 2012-12-03 MED ORDER — SODIUM CHLORIDE 0.9 % IV SOLN
INTRAVENOUS | Status: DC
  Administered 2012-12-03: 15:00:00 via INTRAVENOUS

## 2012-12-03 MED ORDER — HYDROCODONE-ACETAMINOPHEN 10-325 MG PO TABS
1.0000 | ORAL_TABLET | Freq: Four times a day (QID) | ORAL | Status: DC | PRN
Start: 2012-12-03 — End: 2012-12-05
  Administered 2012-12-03 – 2012-12-04 (×2): 1 via ORAL
  Filled 2012-12-03 (×2): qty 1

## 2012-12-03 MED ORDER — CLONAZEPAM 0.5 MG PO TABS
0.5000 mg | ORAL_TABLET | Freq: Every day | ORAL | Status: DC
  Administered 2012-12-03 – 2012-12-04 (×2): 0.5 mg via ORAL
  Filled 2012-12-03 (×2): qty 1

## 2012-12-03 MED ORDER — HYDROXYZINE HCL 25 MG PO TABS
25.0000 mg | ORAL_TABLET | Freq: Two times a day (BID) | ORAL | Status: DC
  Administered 2012-12-03 – 2012-12-05 (×4): 25 mg via ORAL
  Filled 2012-12-03 (×4): qty 1

## 2012-12-03 MED ORDER — CYCLOBENZAPRINE HCL 10 MG PO TABS: 10.0000 mg | ORAL_TABLET | Freq: Three times a day (TID) | ORAL | Status: DC | PRN

## 2012-12-03 MED ORDER — IPRATROPIUM BROMIDE 0.02 % IN SOLN
0.5000 mg | RESPIRATORY_TRACT | Status: DC
  Administered 2012-12-03 – 2012-12-04 (×5): 0.5 mg via RESPIRATORY_TRACT
  Filled 2012-12-03 (×5): qty 2.5

## 2012-12-03 MED ORDER — ONDANSETRON HCL 4 MG PO TABS: 4.0000 mg | ORAL_TABLET | Freq: Four times a day (QID) | ORAL | Status: DC | PRN

## 2012-12-03 MED ORDER — SODIUM CHLORIDE 0.9 % IV SOLN: 250.0000 mL | INTRAVENOUS | Status: DC | PRN

## 2012-12-03 MED ORDER — ACETAMINOPHEN 650 MG RE SUPP: 650.0000 mg | Freq: Four times a day (QID) | RECTAL | Status: DC | PRN

## 2012-12-03 NOTE — ED Notes (Signed)
Patient given water to drink per RN approval.

## 2012-12-03 NOTE — H&P (Signed)
History and Physical  Olivia Randall:[id number] DOB: Feb 19, 1951 DOA: 12/03/2012  Referring physician: Samuel Jester, MD PCP: Remus Loffler, PA-C   Chief Complaint: Short of breath  HPI:  62 year old woman presented to the emergency department with increasing shortness of breath. Initial evaluation in the emergency department suggested COPD exacerbation.  Patient with PMH chronic respiratory failure on 2 L per minute nasal cannula 24/7 secondary to COPD. Over the last 8 days she has had increasing cough and shortness of breath. She saw her primary care provider in the office 3/18 and was given a steroid injection, moxifloxacin. Despite this treatment her condition is continued to worsen with increasing shortness of breath. Cough continues. Nebulizers have not helped.  In ED  Afebrile, VSS  BMP, CBC unremarkable  CXR no acute disease  EKG NSR, no acute changes  Treated with Solu-Medrol, nebulizers, Levaquin.  Admitted for further treatment for COPD exacerbation.  Review of Systems:  Negative for fever, visual changes, rash, new muscle aches, chest pain, dysuria, bleeding, n/v/abdominal pain.   Positive for fever at home, sore throat from coughing, acute on chronic bilateral hip pain and l back pain.    Past Medical History  Diagnosis Date  . COPD (chronic obstructive pulmonary disease)   . Hypertension   . Sleep apnea   . Depression   . On home O2     2L N/C  . Chronic back pain   . Anxiety and depression   . GERD (gastroesophageal reflux disease)   . Chronic neck pain     Past Surgical History  Procedure Laterality Date  . Abdominal hysterectomy    . Bladder surgery    . Breast surgery    . Hemorrhoid surgery    . Carpal tunnel release    . Neck surgery    . Shoulder surgery Right     Social History:  reports that she has been smoking.  She does not have any smokeless tobacco history on file. She reports that she does not drink alcohol or use illicit  drugs.  No Known Allergies  Family History  Problem Relation Age of Onset  . Diabetes Mother   . Diabetes Father   . Diabetes Brother      Prior to Admission medications   Medication Sig Start Date End Date Taking? Authorizing Provider  albuterol (PROAIR HFA) 108 (90 BASE) MCG/ACT inhaler Inhale 2 puffs into the lungs every 6 (six) hours as needed.   Yes Historical Provider, MD  albuterol (PROVENTIL) (2.5 MG/3ML) 0.083% nebulizer solution Take 2.5 mg by nebulization every 6 (six) hours as needed.   Yes Historical Provider, MD  ALPRAZolam Prudy Feeler) 0.5 MG tablet Take 0.5 mg by mouth 3 (three) times daily as needed for sleep or anxiety.    Yes Historical Provider, MD  clonazePAM (KLONOPIN) 0.5 MG tablet Take 0.5 mg by mouth at bedtime.   Yes Historical Provider, MD  cyclobenzaprine (FLEXERIL) 10 MG tablet Take 10 mg by mouth 3 (three) times daily as needed for muscle spasms.    Yes Historical Provider, MD  furosemide (LASIX) 20 MG tablet Take 20 mg by mouth daily.   Yes Historical Provider, MD  HYDROcodone-acetaminophen (NORCO) 10-325 MG per tablet Take 1 tablet by mouth every 6 (six) hours as needed for pain.    Yes Historical Provider, MD  hydrOXYzine (ATARAX/VISTARIL) 25 MG tablet Take 25 mg by mouth 2 (two) times daily.   Yes Historical Provider, MD  ipratropium (ATROVENT) 0.02 % nebulizer solution  Take 500 mcg by nebulization every 6 (six) hours as needed for wheezing (*To be mixed with Albuterol for treatments via nebulization*).   Yes Historical Provider, MD  loratadine (CLARITIN) 10 MG tablet Take 10 mg by mouth every morning.    Yes Historical Provider, MD  omeprazole (PRILOSEC) 20 MG capsule Take 20 mg by mouth 2 (two) times daily.    Yes Historical Provider, MD  PARoxetine (PAXIL) 40 MG tablet Take 40 mg by mouth every morning.   Yes Historical Provider, MD   Physical Exam: Filed Vitals:   12/03/12 1419 12/03/12 1426 12/03/12 1500 12/03/12 1600  BP:  116/66 132/58 129/64  Pulse:   92 88 90  Temp:      TempSrc:      Resp:  20 22 28   Height:      Weight:      SpO2: 94% 100% 100% 95%     General:  Examined in the emergency department. Appears calm and comfortable Eyes: PERRL, normal lids, irises  ENT: grossly normal hearing, lips Neck: no LAD, masses or thyromegaly Cardiovascular: RRR, no m/r/g. No LE edema. Respiratory: Decreased breath sounds bilaterally, poor air movement, few wheezes. No rhonchi or rales. Moderate increased respiratory effort. Abdomen: soft, ntnd Skin: no rash or induration seen  Musculoskeletal: grossly normal tone BUE/BLE Psychiatric: grossly normal mood and affect, speech fluent and appropriate Neurologic: grossly non-focal.  Wt Readings from Last 3 Encounters:  12/03/12 72.122 kg (159 lb)  02/28/12 70.761 kg (156 lb)  09/13/11 65.772 kg (145 lb)    Labs on Admission:  Basic Metabolic Panel:  Recent Labs Lab 12/03/12 1350  NA 141  K 3.3*  CL 98  CO2 35*  GLUCOSE 104*  BUN 6  CREATININE 0.82  CALCIUM 9.7   CBC:  Recent Labs Lab 12/03/12 1350  WBC 7.0  NEUTROABS 4.7  HGB 13.0  HCT 40.1  MCV 90.1  PLT 232    Cardiac Enzymes:  Recent Labs Lab 12/03/12 1350  TROPONINI <0.30    Radiological Exams on Admission: Dg Chest 2 View  12/03/2012  *RADIOLOGY REPORT*  Clinical Data: Cough, dizziness.  CHEST - 2 VIEW  Comparison: 02/28/2012  Findings: Lungs are hyperinflated with somewhat attenuated bronchovascular markings.  No focal infiltrate or overt edema. Chronic blunting of the right lateral costophrenic angle.  Heart size normal.  Atheromatous aorta.  Cervical fixation hardware is noted.  No effusion.  Spondylitic changes in the mid thoracic spine.  IMPRESSION:  1.  Hyperinflation without acute or superimposed abnormality.   Original Report Authenticated By: D. Andria Rhein, MD     EKG: Independently reviewed. As above.    Principal Problem:   COPD with acute exacerbation Active Problems:   Chronic  respiratory failure   Hypokalemia   Chronic back pain   GERD (gastroesophageal reflux disease)   Assessment/Plan 1. Acute COPD exacerbation: Appears stable to admit to medical floor. Steroids, nebulizers, oxygen, antibiotics. 2. Chronic respiratory failure: Oxygenation stable on 2 L per minute (home use) 3. Hypokalemia: Replete. 4. HTN: Stable. 5. Chronic back pain: Stable. Continue Lortab. 6. GERD: Stable. Continue PPI.   Code Status: Full code Family Communication: Discussed with husband at bedside Disposition Plan/Anticipated LOS: Admit, 2-3 days.  Time spent:  50 minutes  Brendia Sacks, MD  [facility] Pager [phone] 12/03/2012, 4:37 PM

## 2012-12-03 NOTE — ED Notes (Signed)
Pt reports woke up yesterday feeling "swimmy headed", achy, and coughing more than usual.  Reports cough is nonproductive.  Pt on continuous home 02 at 2liters.  Also reports is more SOB than usual.

## 2012-12-03 NOTE — Progress Notes (Signed)
ANTIBIOTIC CONSULT NOTE - INITIAL  Pharmacy Consult for Levaquin  Indication: Exacerbation COPD  No Known Allergies  Patient Measurements: Height: 5\' 6"  (167.6 cm) Weight: 159 lb (72.122 kg) IBW/kg (Calculated) : 59.3   Vital Signs: Temp: 98.1 F (36.7 C) (03/23 1322) Temp src: Oral (03/23 1322) BP: 129/64 mmHg (03/23 1600) Pulse Rate: 90 (03/23 1600) Intake/Output from previous day:   Intake/Output from this shift:    Labs:  Recent Labs  12/03/12 1350  WBC 7.0  HGB 13.0  PLT 232  CREATININE 0.82   Estimated Creatinine Clearance: 71.4 ml/min (by C-G formula based on Cr of 0.82). No results found for this basename: VANCOTROUGH, VANCOPEAK, VANCORANDOM, GENTTROUGH, GENTPEAK, GENTRANDOM, TOBRATROUGH, TOBRAPEAK, TOBRARND, AMIKACINPEAK, AMIKACINTROU, AMIKACIN,  in the last 72 hours   Microbiology: No results found for this or any previous visit (from the past 720 hour(s)).  Medical History: Past Medical History  Diagnosis Date  . COPD (chronic obstructive pulmonary disease)   . Hypertension   . Sleep apnea   . Depression   . On home O2     2L N/C  . Chronic back pain   . Anxiety and depression   . GERD (gastroesophageal reflux disease)   . Chronic neck pain     Medications:  Scheduled:  . [COMPLETED] albuterol  15 mg Nebulization Once  . [COMPLETED] ipratropium  1 mg Nebulization Once  . [COMPLETED] methylPREDNISolone (SOLU-MEDROL) injection  125 mg Intravenous Once   Assessment: CrCL 71.4 ml/min Patient weight 72.1 kg  Goal of Therapy:  Eradicate infection  Plan:  Levaquin 750 mg IV every 24 hours Monitor renal function Labs per protocol  Olivia Randall, Olivia Randall 12/03/2012,4:51 PM

## 2012-12-03 NOTE — ED Provider Notes (Signed)
History     CSN: [id number]  Arrival date & time 12/03/12  1317   First MD Initiated Contact with Patient 12/03/12 1332      Chief Complaint  Patient presents with  . Dizziness  . Cough  . generalized body aches      HPI Pt was seen at 1335.   Per pt, c/o gradual onset and worsening of persistent cough, wheezing and SOB for the past 1 week, worse over the past 2 to 3 days.  Describes her symptoms as "my COPD is acting up."  Has been using home MDI and nebs without relief. Describes the cough as non-productive. Has been associated with feeling generalized weakness/fatigue, "chills," and lightheadedness that occurs when changing body positions (lay to stand). Denies CP/palpitations, no back pain, no abd pain, no N/V/D, no rash.  Pt states the coughing as caused gradual onset and persistence of constant acute flair of her chronic right sided low back "pain" for the past several days.  Denies any change in her usual chronic pain pattern.  Pain worsens with palpation of the area and body position changes. Denies incont/retention of bowel or bladder, no saddle anesthesia, no focal motor weakness, no tingling/numbness in extremities, no injury, no abd pain.       Past Medical History  Diagnosis Date  . COPD (chronic obstructive pulmonary disease)   . Hypertension   . Sleep apnea   . Depression   . On home O2     2L N/C  . Chronic back pain   . Anxiety and depression   . GERD (gastroesophageal reflux disease)   . Chronic neck pain     Past Surgical History  Procedure Laterality Date  . Abdominal hysterectomy    . Bladder surgery    . Breast surgery    . Hemorrhoid surgery    . Carpal tunnel release    . Neck surgery    . Shoulder surgery Right     History  Substance Use Topics  . Smoking status: Current Every Day Smoker  . Smokeless tobacco: Not on file  . Alcohol Use: No    Review of Systems ROS: Statement: All systems negative except as marked or noted in the HPI;  Constitutional: Negative for fever and +chills, generalized weakness/fatigue. ; ; Eyes: Negative for eye pain, redness and discharge. ; ; ENMT: Negative for ear pain, hoarseness, nasal congestion, sinus pressure and sore throat. ; ; Cardiovascular: Negative for chest pain, palpitations, diaphoresis, and peripheral edema. ; ; Respiratory: +cough, wheezing, SOB. Negative for stridor. ; ; Gastrointestinal: Negative for nausea, vomiting, diarrhea, abdominal pain, blood in stool, hematemesis, jaundice and rectal bleeding. . ; ; Genitourinary: Negative for dysuria, flank pain and hematuria. ; ; Musculoskeletal: +LBP. Negative for neck pain. Negative for swelling and trauma.; ; Skin: Negative for pruritus, rash, abrasions, blisters, bruising and skin lesion.; ; Neuro: +lightheadedness. Negative for headache and neck stiffness. Negative for weakness, altered level of consciousness , altered mental status, extremity weakness, paresthesias, involuntary movement, seizure and syncope.     Allergies  Review of patient's allergies indicates no known allergies.  Home Medications   Current Outpatient Rx  Name  Route  Sig  Dispense  Refill  . albuterol (PROAIR HFA) 108 (90 BASE) MCG/ACT inhaler   Inhalation   Inhale 2 puffs into the lungs every 6 (six) hours as needed.         [name] albuterol (PROVENTIL) (2.5 MG/3ML) 0.083% nebulizer solution   Nebulization  Take 2.5 mg by nebulization every 6 (six) hours as needed.         . ALPRAZolam (XANAX) 0.5 MG tablet   Oral   Take 0.5 mg by mouth 3 (three) times daily as needed.         . cyclobenzaprine (FLEXERIL) 10 MG tablet   Oral   Take 10 mg by mouth 3 (three) times daily as needed.         . donepezil (ARICEPT) 10 MG tablet   Oral   Take 10 mg by mouth at bedtime.         . furosemide (LASIX) 20 MG tablet   Oral   Take 20 mg by mouth daily.         [name] HYDROcodone-acetaminophen (NORCO) 10-325 MG per tablet   Oral   Take 1 tablet by mouth  every 6 (six) hours as needed.         [name] HYDROXYZINE PAMOATE PO   Oral   Take 1 tablet by mouth 3 (three) times daily as needed.         . loratadine (CLARITIN) 10 MG tablet   Oral   Take 10 mg by mouth daily.         [name] omeprazole (PRILOSEC) 20 MG capsule   Oral   Take 20 mg by mouth daily.         [name] PARoxetine (PAXIL) 40 MG tablet   Oral   Take 40 mg by mouth every morning.         . Pseudoeph-Doxylamine-DM-APAP (NYQUIL) 60-7.02-09-999 MG/30ML LIQD   Oral   Take 15 mLs by mouth as needed. For coughing           BP 96/49  Pulse 96  Temp(Src) 98.1 F (36.7 C) (Oral)  Resp 16  Ht 5\' 6"  (1.676 m)  Wt 159 lb (72.122 kg)  BMI 25.68 kg/m2  SpO2 96%  Physical Exam 1340: Physical examination:  Nursing notes reviewed; Vital signs and O2 SAT reviewed;  Constitutional: Well developed, Well nourished, Uncomfortable appearing; Head:  Normocephalic, atraumatic; Eyes: EOMI, PERRL, No scleral icterus; ENMT: Mouth and pharynx normal, Mucous membranes dry; Neck: Supple, Full range of motion, No lymphadenopathy; Cardiovascular: Regular rate and rhythm, No gallop; Respiratory: Breath sounds diminished & equal bilaterally, insp/exp wheezes bilat with audible wheezing. Speaking short sentences, Sitting upright, +tachypneic, +access mm use; Chest: Nontender, Movement normal; Abdomen: Soft, Nontender, Nondistended, Normal bowel sounds; Genitourinary: No CVA tenderness; Spine:  No midline CS, TS, LS tenderness.  +TTP right lumbar paraspinal muscles.;; Extremities: Pulses normal, No tenderness, No edema, No calf edema or asymmetry.; Neuro: AA&Ox3, Major CN grossly intact. No facial droop. Speech clear. No gross focal motor or sensory deficits in extremities.; Skin: Color normal, Warm, Dry.   ED Course  Procedures   1345:  Pt with wheezing on arrival, appears SOB and uncomfortable.  Will dose IV steroid and start continuous neb.  +orthostatic on VS, will dose judicious IVF.   1600:   Continuous neb completed.  Pt continues to tachypneic, +access mm use, lungs diminished with exp wheezes bilat.  No further audible wheezing.  Sats remain 93-96% on her usual O2 2L N/C.  Dx and testing d/w pt and family.  Questions answered.  Verb understanding, agreeable to observation admit.  1640:  T/C to [facility]. Irene Limbo, case discussed, including:  HPI, pertinent PM/SHx, VS/PE, dx testing, ED course and treatment:  Agreeable to admit, requests to write temporary orders, obtain medical bed  to team 2.   MDM  MDM Reviewed: previous chart, nursing note and vitals Reviewed previous: labs and ECG Interpretation: labs, x-ray and ECG Total time providing critical care: 30-74 minutes. This excludes time spent performing separately reportable procedures and services. Consults: admitting MD   CRITICAL CARE Performed by: Laray Anger Total critical care time: 40 Critical care time was exclusive of separately billable procedures and treating other patients. Critical care was necessary to treat or prevent imminent or life-threatening deterioration. Critical care was time spent personally by me on the following activities: development of treatment plan with patient and/or surrogate as well as nursing, discussions with consultants, evaluation of patient's response to treatment, examination of patient, obtaining history from patient or surrogate, ordering and performing treatments and interventions, ordering and review of laboratory studies, ordering and review of radiographic studies, pulse oximetry and re-evaluation of patient's condition.    Date: 12/03/2012  Rate: 86  Rhythm: normal sinus rhythm  QRS Axis: normal  Intervals: normal  ST/T Wave abnormalities: normal  Conduction Disutrbances:none  Narrative Interpretation:   Old EKG Reviewed: unchanged; no significant changes from previous EKG dated 09/16/2009.  Results for orders placed during the hospital encounter of 12/03/12  BASIC  METABOLIC PANEL      Result Value Range   Sodium 141  135 - 145 mEq/L   Potassium 3.3 (*) 3.5 - 5.1 mEq/L   Chloride 98  96 - 112 mEq/L   CO2 35 (*) 19 - 32 mEq/L   Glucose, Bld 104 (*) 70 - 99 mg/dL   BUN 6  6 - 23 mg/dL   Creatinine, Ser 1.61  0.50 - 1.10 mg/dL   Calcium 9.7  8.4 - 09.6 mg/dL   GFR calc non Af Amer 75 (*) >90 mL/min   GFR calc Af Amer 86 (*) >90 mL/min  CBC WITH DIFFERENTIAL      Result Value Range   WBC 7.0  4.0 - 10.5 K/uL   RBC 4.45  3.87 - 5.11 MIL/uL   Hemoglobin 13.0  12.0 - 15.0 g/dL   HCT 04.5  40.9 - 81.1 %   MCV 90.1  78.0 - 100.0 fL   MCH 29.2  26.0 - 34.0 pg   MCHC 32.4  30.0 - 36.0 g/dL   RDW 91.4  78.2 - 95.6 %   Platelets 232  150 - 400 K/uL   Neutrophils Relative 68  43 - 77 %   Neutro Abs 4.7  1.7 - 7.7 K/uL   Lymphocytes Relative 20  12 - 46 %   Lymphs Abs 1.4  0.7 - 4.0 K/uL   Monocytes Relative 8  3 - 12 %   Monocytes Absolute 0.5  0.1 - 1.0 K/uL   Eosinophils Relative 4  0 - 5 %   Eosinophils Absolute 0.3  0.0 - 0.7 K/uL   Basophils Relative 0  0 - 1 %   Basophils Absolute 0.0  0.0 - 0.1 K/uL  TROPONIN I      Result Value Range   Troponin I <0.30  <0.30 ng/mL   Dg Chest 2 View 12/03/2012  *RADIOLOGY REPORT*  Clinical Data: Cough, dizziness.  CHEST - 2 VIEW  Comparison: 02/28/2012  Findings: Lungs are hyperinflated with somewhat attenuated bronchovascular markings.  No focal infiltrate or overt edema. Chronic blunting of the right lateral costophrenic angle.  Heart size normal.  Atheromatous aorta.  Cervical fixation hardware is noted.  No effusion.  Spondylitic changes in the mid thoracic spine.  IMPRESSION:  1.  Hyperinflation without acute or superimposed abnormality.   Original Report Authenticated By: D. Andria Rhein, MD               Laray Anger, DO 12/04/12 1346

## 2012-12-03 NOTE — ED Notes (Signed)
Patient complained of being dizzy and "feeling funny" with position changes

## 2012-12-04 DIAGNOSIS — R42 Dizziness and giddiness: Secondary | ICD-10-CM | POA: Diagnosis present

## 2012-12-04 DIAGNOSIS — J441 Chronic obstructive pulmonary disease with (acute) exacerbation: Secondary | ICD-10-CM

## 2012-12-04 LAB — BASIC METABOLIC PANEL
Calcium: 9.9 mg/dL (ref 8.4–10.5)
GFR calc Af Amer: >90 mL/min (ref >90)
GFR calc non Af Amer: 89 mL/min — ABNORMAL LOW (ref >90)
Sodium: 136 mEq/L (ref 135–145)

## 2012-12-04 MED ORDER — IPRATROPIUM BROMIDE 0.02 % IN SOLN
0.5000 mg | Freq: Four times a day (QID) | RESPIRATORY_TRACT | Status: DC
  Administered 2012-12-04 – 2012-12-05 (×3): 0.5 mg via RESPIRATORY_TRACT
  Filled 2012-12-04 (×3): qty 2.5

## 2012-12-04 MED ORDER — MECLIZINE HCL 12.5 MG PO TABS
25.0000 mg | ORAL_TABLET | Freq: Every day | ORAL | Status: DC
  Administered 2012-12-04 – 2012-12-05 (×2): 25 mg via ORAL
  Filled 2012-12-04 (×2): qty 2

## 2012-12-04 MED ORDER — IPRATROPIUM BROMIDE 0.02 % IN SOLN: 0.5000 mg | Freq: Four times a day (QID) | RESPIRATORY_TRACT | Status: DC

## 2012-12-04 MED ORDER — POTASSIUM CHLORIDE CRYS ER 20 MEQ PO TBCR
40.0000 meq | EXTENDED_RELEASE_TABLET | Freq: Once | ORAL | Status: AC
  Administered 2012-12-04: 40 meq via ORAL
  Filled 2012-12-04: qty 2

## 2012-12-04 MED ORDER — ALBUTEROL SULFATE (5 MG/ML) 0.5% IN NEBU
2.5000 mg | INHALATION_SOLUTION | Freq: Four times a day (QID) | RESPIRATORY_TRACT | Status: DC
  Administered 2012-12-04 – 2012-12-05 (×3): 2.5 mg via RESPIRATORY_TRACT
  Filled 2012-12-04 (×3): qty 0.5

## 2012-12-04 MED ORDER — ALBUTEROL SULFATE (5 MG/ML) 0.5% IN NEBU: 2.5000 mg | INHALATION_SOLUTION | RESPIRATORY_TRACT | Status: DC

## 2012-12-04 MED ORDER — POTASSIUM CHLORIDE CRYS ER 10 MEQ PO TBCR
10.0000 meq | EXTENDED_RELEASE_TABLET | Freq: Every day | ORAL | Status: DC
  Administered 2012-12-05: 10 meq via ORAL
  Filled 2012-12-04: qty 1

## 2012-12-04 MED ORDER — ALBUTEROL SULFATE (5 MG/ML) 0.5% IN NEBU: 2.5000 mg | INHALATION_SOLUTION | RESPIRATORY_TRACT | Status: DC | PRN

## 2012-12-04 NOTE — Progress Notes (Signed)
UR Chart Review Completed  

## 2012-12-04 NOTE — Progress Notes (Signed)
Patient ambulated in hall, o2 sats 93% on O2 at 2lpm via nasal cannula. Resident complained of mild shortness of breath.

## 2012-12-04 NOTE — Progress Notes (Signed)
Pt wore CPAP for awhile had off by 0340 am

## 2012-12-04 NOTE — Progress Notes (Signed)
Patient seen, independently examined and chart reviewed. I agree with exam, assessment and plan discussed with Toya Smothers, NP.  Subjective: Feeling much better. Still coughing.  Objective: Appears better today. Nontoxic. Improved air movement but still poor overall, decreased respiratory effort.  Acute issues:  COPD exacerbation improving.  Chronic respiratory failure stable.  Plan:  Continue present management. Hopeful discharge 3/25.  Brendia Sacks, MD [facility] [phone]

## 2012-12-04 NOTE — Progress Notes (Signed)
[facility] PROGRESS NOTE  Olivia Randall EAV:[id number] DOB: [birth year] DOA: 12/03/2012 PCP: Remus Loffler, PA-C  Assessment/Plan: 1. Acute COPD exacerbation: Improved. Wear oxygen at home. Faint wheezes on exam with decreased air flow. Will continue solumedrol for few more doses before switching to po. Continue nebulizers, oxygen, and levaquin day #2. 2. Chronic respiratory failure: Oxygenation stable on 2 L per minute (home use). See #1.  3. Hypokalemia: likely from lasix. Will replete and recheck.  4. HTN: controlled 5. Chronic back pain: Stable at baseline. Continue Lortab. 6. GERD: at baseline.  Continue PPI. 7. Dizziness: reports "head swimming". Hx of inner ear/vertigo in past. Will initiate meclizine. Will ambulate. Monitor. If no improvement consider PT evaluation  8. Sleep apnea: CPAP per respiratory     Code Status: full Family Communication: husband at bedside Disposition Plan: Home hopefully tomorrow.    Consultants:  none  Procedures:  none  Antibiotics:  Levaquin 12/03/12>>  HPI/Subjective: Reports breathing better but feels no better due to "dizziness"  Objective: Filed Vitals:   12/03/12 2347 12/04/12 0359 12/04/12 0500 12/04/12 0710  BP:   113/50   Pulse: 83  85   Temp:   97.8 F (36.6 C)   TempSrc:   Oral   Resp: 20  16   Height:      Weight:      SpO2: 96% 95% 100% 97%   No intake or output data in the 24 hours ending 12/04/12 1054 Filed Weights   12/03/12 1322 12/03/12 1745  Weight: 72.122 kg (159 lb) 72.122 kg (159 lb)    Exam:   General:  Well nourished, NAD  Cardiovascular: RRR No MGR No LEE PPP bilaterally  Respiratory: normal effort at rest.  BS diminished throughout, poor air flow, faint wheeze. No rhonchi no crackles.   Abdomen: obese soft +BS non-tender to palpation  Musculoskeletal: joints without swelling/erythema, full rom. No clubbing/cyanosis   Data Reviewed: Basic Metabolic Panel:  Recent Labs Lab  12/03/12 1350  NA 141  K 3.3*  CL 98  CO2 35*  GLUCOSE 104*  BUN 6  CREATININE 0.82  CALCIUM 9.7   Liver Function Tests: No results found for this basename: AST, ALT, ALKPHOS, BILITOT, PROT, ALBUMIN,  in the last 168 hours No results found for this basename: LIPASE, AMYLASE,  in the last 168 hours No results found for this basename: AMMONIA,  in the last 168 hours CBC:  Recent Labs Lab 12/03/12 1350  WBC 7.0  NEUTROABS 4.7  HGB 13.0  HCT 40.1  MCV 90.1  PLT 232   Cardiac Enzymes:  Recent Labs Lab 12/03/12 1350  TROPONINI <0.30   BNP (last 3 results) No results found for this basename: PROBNP,  in the last 8760 hours CBG: No results found for this basename: GLUCAP,  in the last 168 hours  No results found for this or any previous visit (from the past 240 hour(s)).   Studies: Dg Chest 2 View  12/03/2012  *RADIOLOGY REPORT*  Clinical Data: Cough, dizziness.  CHEST - 2 VIEW  Comparison: 02/28/2012  Findings: Lungs are hyperinflated with somewhat attenuated bronchovascular markings.  No focal infiltrate or overt edema. Chronic blunting of the right lateral costophrenic angle.  Heart size normal.  Atheromatous aorta.  Cervical fixation hardware is noted.  No effusion.  Spondylitic changes in the mid thoracic spine.  IMPRESSION:  1.  Hyperinflation without acute or superimposed abnormality.   Original Report Authenticated By: D. Andria Rhein, MD  Scheduled Meds: . ipratropium  0.5 mg Nebulization Q4H   And  . albuterol  2.5 mg Nebulization Q4H  . clonazePAM  0.5 mg Oral QHS  . enoxaparin (LOVENOX) injection  40 mg Subcutaneous Q24H  . furosemide  20 mg Oral Daily  . hydrOXYzine  25 mg Oral BID  . levofloxacin (LEVAQUIN) IV  750 mg Intravenous Q24H  . loratadine  10 mg Oral q morning - 10a  . methylPREDNISolone (SOLU-MEDROL) injection  40 mg Intravenous Q6H  . pantoprazole  40 mg Oral Daily  . PARoxetine  40 mg Oral Daily  . sodium chloride  3 mL Intravenous Q12H    Continuous Infusions:   Principal Problem:   COPD with acute exacerbation Active Problems:   Chronic respiratory failure   Hypokalemia   Chronic back pain   GERD (gastroesophageal reflux disease)   Dizzy    Time spent: 40 minutes    [facility] M  [facility]  If 7PM-7AM, please contact night-coverage at [email or web address], password [facility] 12/04/2012, 10:54 AM  LOS: 1 day

## 2012-12-04 NOTE — Care Management Note (Signed)
    Page 1 of 1   12/05/2012     2:51:09 PM   CARE MANAGEMENT NOTE 12/05/2012  Patient:  Olivia Randall, Olivia Randall   Account Number:  [id number]  Date Initiated:  12/04/2012  Documentation initiated by:  Rosemary Holms  Subjective/Objective Assessment:   Pt admitted from home with COPD exascerbation. On O2 at home on 2L. States she has an appt on Weds at [facility] to have a MS workup. Pt receptive to [facility] RN at DC.     Action/Plan:   Anticipated DC Date:  12/05/2012   Anticipated DC Plan:  HOME/SELF CARE      DC Planning Services  CM consult      Choice offered to / List presented to:             Status of service:  Completed, signed off Medicare Important Message given?  NA - LOS <3 / Initial given by admissions (If response is "NO", the following Medicare IM given date fields will be blank) Date Medicare IM given:   Date Additional Medicare IM given:    Discharge Disposition:  HOME/SELF CARE  Per UR Regulation:    If discussed at Long Length of Stay Meetings, dates discussed:    Comments:  12/05/12 Rosemary Holms RN BSN CM Pt declined HH at Discharge.,  12/04/12 Rosemary Holms RN BSN CM

## 2012-12-05 MED ORDER — MOMETASONE FURO-FORMOTEROL FUM 100-5 MCG/ACT IN AERO
2.0000 | INHALATION_SPRAY | Freq: Two times a day (BID) | RESPIRATORY_TRACT | Status: DC
  Administered 2012-12-05: 2 via RESPIRATORY_TRACT
  Filled 2012-12-05: qty 8.8

## 2012-12-05 MED ORDER — ALBUTEROL SULFATE HFA 108 (90 BASE) MCG/ACT IN AERS
2.0000 | INHALATION_SPRAY | Freq: Four times a day (QID) | RESPIRATORY_TRACT | Status: DC | PRN
  Administered 2012-12-05: 2 via RESPIRATORY_TRACT
  Filled 2012-12-05: qty 6.7

## 2012-12-05 MED ORDER — MOMETASONE FURO-FORMOTEROL FUM 100-5 MCG/ACT IN AERO: 2.0000 | INHALATION_SPRAY | Freq: Two times a day (BID) | RESPIRATORY_TRACT | Status: DC

## 2012-12-05 MED ORDER — PREDNISONE 10 MG PO TABS: ORAL_TABLET | ORAL | Status: DC

## 2012-12-05 MED ORDER — LEVOFLOXACIN 750 MG PO TABS
750.0000 mg | ORAL_TABLET | Freq: Every day | ORAL | Status: DC
  Administered 2012-12-05: 750 mg via ORAL
  Filled 2012-12-05: qty 1

## 2012-12-05 MED ORDER — ALUM & MAG HYDROXIDE-SIMETH 200-200-20 MG/5ML PO SUSP: 30.0000 mL | Freq: Four times a day (QID) | ORAL | Status: DC | PRN

## 2012-12-05 MED ORDER — MECLIZINE HCL 25 MG PO TABS: 25.0000 mg | ORAL_TABLET | Freq: Every day | ORAL | Status: DC

## 2012-12-05 MED ORDER — POTASSIUM CHLORIDE CRYS ER 10 MEQ PO TBCR: 10.0000 meq | EXTENDED_RELEASE_TABLET | Freq: Every day | ORAL | Status: DC

## 2012-12-05 NOTE — Discharge Summary (Signed)
Patient seen, independently examined and chart reviewed. I agree with exam, assessment, plan and discharge. I  discussed with Toya Smothers, NP.   Subjective: Able to ambulate. Overall feeling better. Breathing better. Would like to go home.  Objective:  Appears better. Decreased breath sounds but can speak in full sentences. Nontoxic.  Acute issues:  COPD exacerbation resolved. Chronic respiratory failure stable.  She was treated with antibiotics prior to admission and therefore has completed a short course while in inpatient.  Plan:  Home today.   Brendia Sacks, MD  [facility]  [phone]

## 2012-12-05 NOTE — Progress Notes (Signed)
ANTIBIOTIC CONSULT NOTE  Pharmacy Consult for Levaquin  Indication: Exacerbation COPD  No Known Allergies  Patient Measurements: Height:  (5'7 ') Weight: 159 lb (72.122 kg) IBW/kg (Calculated) : 59.3  Vital Signs: Temp: 98.3 F (36.8 C) (03/25 0515) Temp src: Oral (03/25 0515) BP: 110/66 mmHg (03/25 0515) Pulse Rate: 76 (03/25 0515) Intake/Output from previous day: 03/24 0701 - 03/25 0700 In: 720 [P.O.:720] Out: -  Intake/Output from this shift:    Labs:  Recent Labs  12/03/12 1350 12/04/12 1129  WBC 7.0  --   HGB 13.0  --   PLT 232  --   CREATININE 0.82 0.73   Estimated Creatinine Clearance: 73.2 ml/min (by C-G formula based on Cr of 0.73). No results found for this basename: VANCOTROUGH, VANCOPEAK, VANCORANDOM, GENTTROUGH, GENTPEAK, GENTRANDOM, TOBRATROUGH, TOBRAPEAK, TOBRARND, AMIKACINPEAK, AMIKACINTROU, AMIKACIN,  in the last 72 hours   Microbiology: No results found for this or any previous visit (from the past 720 hour(s)).  Medical History: Past Medical History  Diagnosis Date  . COPD (chronic obstructive pulmonary disease)   . Hypertension   . Sleep apnea   . Depression   . On home O2     2L N/C  . Chronic back pain   . Anxiety and depression   . GERD (gastroesophageal reflux disease)   . Chronic neck pain    Medications:  Scheduled:  . albuterol  2.5 mg Nebulization QID   And  . ipratropium  0.5 mg Nebulization QID  . clonazePAM  0.5 mg Oral QHS  . enoxaparin (LOVENOX) injection  40 mg Subcutaneous Q24H  . furosemide  20 mg Oral Daily  . hydrOXYzine  25 mg Oral BID  . levofloxacin (LEVAQUIN) IV  750 mg Intravenous Q24H  . loratadine  10 mg Oral q morning - 10a  . meclizine  25 mg Oral Daily  . methylPREDNISolone (SOLU-MEDROL) injection  40 mg Intravenous Q6H  . pantoprazole  40 mg Oral Daily  . PARoxetine  40 mg Oral Daily  . potassium chloride  10 mEq Oral Daily  . [COMPLETED] potassium chloride  40 mEq Oral Once  . sodium chloride   3 mL Intravenous Q12H  . [DISCONTINUED] albuterol  2.5 mg Nebulization Q4H  . [DISCONTINUED] albuterol  2.5 mg Nebulization Q4H  . [DISCONTINUED] ipratropium  0.5 mg Nebulization Q4H  . [DISCONTINUED] ipratropium  0.5 mg Nebulization QID   Assessment: Afebrile.  Clinically improving.  Estimated Creatinine Clearance: 73.2 ml/min (by C-G formula based on Cr of 0.73).  Goal of Therapy:  Eradicate infection  Plan:  Levaquin 750 mg PO every 24 hours  PHARMACIST - PHYSICIAN COMMUNICATION  CONCERNING: Antibiotic IV to Oral Route Change Policy  RECOMMENDATION: This patient is receiving Levaquin by the intravenous route.  Based on criteria approved by the Pharmacy and Therapeutics Committee, the antibiotic(s) is/are being converted to the equivalent oral dose form(s).  DESCRIPTION: These criteria include:  Patient being treated for a respiratory tract infection, urinary tract infection, or cellulitis  The patient is not neutropenic and does not exhibit a GI malabsorption state  The patient is eating (either orally or via tube) and/or has been taking other orally administered medications for a least 24 hours  The patient is improving clinically and has a Tmax < 100.5  If you have questions about this conversion, please contact the Pharmacy Department  [x]   [phone] )  [facility] []   [phone] )  [facility]  []   [phone] )  [facility] []   [phone] )  [facility], Maurio Baize A 12/05/2012,8:50 AM

## 2012-12-05 NOTE — Plan of Care (Signed)
Problem: Discharge Progression Outcomes Goal: Able to self administer respiratory meds Outcome: Adequate for Discharge Respiratory therapist educated pt on inhaler w/ spacer.  Pt demonstrated use and verbalized understanding of instructions.

## 2012-12-05 NOTE — Plan of Care (Signed)
Problem: Discharge Progression Outcomes Goal: Activity appropriate for discharge plan Outcome: Adequate for Discharge D/c instructions given to pt and husband, both verbalized understanding of instructions

## 2012-12-05 NOTE — Progress Notes (Signed)
Talked with patient early part of night about trying to wear cpap again tonight pt refuse to try and wear.

## 2012-12-05 NOTE — Discharge Summary (Signed)
Physician Discharge Summary  Olivia Randall ZOX:[id number] DOB: [birth year] DOA: 12/03/2012  PCP: Remus Loffler, PA-C  Admit date: 12/03/2012 Discharge date: 12/05/2012  Time spent: 40 minutes  Recommendations for Outpatient Follow-up:  1. Follow up with PCP 1 week. Recommend bmet to trend potassium level.   Discharge Diagnoses:  Principal Problem:   COPD with acute exacerbation Active Problems:   Chronic respiratory failure   Hypokalemia   Chronic back pain   GERD (gastroesophageal reflux disease)   Dizzy   Discharge Condition: stable  Diet recommendation: regular  Filed Weights   12/03/12 1322 12/03/12 1745  Weight: 72.122 kg (159 lb) 72.122 kg (159 lb)    History of present illness:   62 year old woman presented to the emergency department on 12/03/12  with increasing shortness of breath. Initial evaluation in the emergency department suggested COPD exacerbation. Patient with PMH chronic respiratory failure on 2 L per minute nasal cannula 24/7 secondary to COPD. Over the previous 8 days she had increasing cough and shortness of breath. She saw her primary care provider in the office 3/18 and was given a steroid injection as well as  moxifloxacin. Despite this treatment her condition continued to worsen with increasing shortness of breath. Cough continued. Nebulizers did not help. [facility] asked to admit.        Hospital Course:  Acute COPD exacerbation: pt admitted to floor. Chest xray hyperinflation without acute or superimposed abnormality. Given solumedrol, nebulizers and levaquin IV . Pt quickly improved. Levaquin changed to po 12/04/12. On day of discharge pt completed antibiotic therapy of 3 days. At discharge pt at baseline. Maintaining sats on 2L with exertion.  Discharged with inhalers and prednisone taper. Recommend follow up with PCP 1 week.   Chronic respiratory failure: Oxygenation remained stable on 2 L per minute (home use). See #1.   Hypokalemia: likely from  lasix. Was repleted. At discharge potassium 4.5.    HTN: controlled. Continued on home regimen  Chronic back pain: remained stable at baseline. Continued home pain regimen  GERD:  Remained at baseline during hospitalization.   Dizziness: reported "head swimming". Hx of inner ear/vertigo in past. Initiated meclizine. Improved at discharge.   Sleep apnea: CPAP per respiratory. Pt willing to wear only intermittently.  Procedures:  None  Consultations:  none  Discharge Exam: Filed Vitals:   12/04/12 1934 12/04/12 2157 12/05/12 0515 12/05/12 0708  BP:  112/57 110/66   Pulse:  104 76   Temp:  98.4 F (36.9 C) 98.3 F (36.8 C)   TempSrc:  Oral Oral   Resp:  18 18   Height:      Weight:      SpO2: 94% 95% 96% 94%    General: awake alert  Cardiovascular: RRR No MGR No LE edema Respiratory: normal effort. BS somewhat distant but good air flow. Bilateral wheeze improved. No crackles  Discharge Instructions  Discharge Orders   Future Orders Complete By Expires     Call MD for:  difficulty breathing, headache or visual disturbances  As directed     Call MD for:  persistant dizziness or light-headedness  As directed     Diet - low sodium heart healthy  As directed     Increase activity slowly  As directed         Medication List    TAKE these medications       ALPRAZolam 0.5 MG tablet  Commonly known as:  XANAX  Take 0.5 mg by mouth 3 (three)  times daily as needed for sleep or anxiety.     alum & mag hydroxide-simeth 200-200-20 MG/5ML suspension  Commonly known as:  MAALOX/MYLANTA  Take 30 mLs by mouth every 6 (six) hours as needed.     clonazePAM 0.5 MG tablet  Commonly known as:  KLONOPIN  Take 0.5 mg by mouth at bedtime.     cyclobenzaprine 10 MG tablet  Commonly known as:  FLEXERIL  Take 10 mg by mouth 3 (three) times daily as needed for muscle spasms.     furosemide 20 MG tablet  Commonly known as:  LASIX  Take 20 mg by mouth daily.      HYDROcodone-acetaminophen 10-325 MG per tablet  Commonly known as:  NORCO  Take 1 tablet by mouth every 6 (six) hours as needed for pain.     hydrOXYzine 25 MG tablet  Commonly known as:  ATARAX/VISTARIL  Take 25 mg by mouth 2 (two) times daily.     ipratropium 0.02 % nebulizer solution  Commonly known as:  ATROVENT  Take 500 mcg by nebulization every 6 (six) hours as needed for wheezing (*To be mixed with Albuterol for treatments via nebulization*).     loratadine 10 MG tablet  Commonly known as:  CLARITIN  Take 10 mg by mouth every morning.     meclizine 25 MG tablet  Commonly known as:  ANTIVERT  Take 1 tablet (25 mg total) by mouth daily.     mometasone-formoterol 100-5 MCG/ACT Aero  Commonly known as:  DULERA  Inhale 2 puffs into the lungs 2 (two) times daily.     omeprazole 20 MG capsule  Commonly known as:  PRILOSEC  Take 20 mg by mouth 2 (two) times daily.     PARoxetine 40 MG tablet  Commonly known as:  PAXIL  Take 40 mg by mouth every morning.     potassium chloride 10 MEQ tablet  Commonly known as:  K-DUR,KLOR-CON  Take 1 tablet (10 mEq total) by mouth daily.     predniSONE 10 MG tablet  Commonly known as:  DELTASONE  Take 4 tabs for 3 days, take 2 tabs for 3 days, take 1 tab for 3 days then stop.     PROAIR HFA 108 (90 BASE) MCG/ACT inhaler  Generic drug:  albuterol  Inhale 2 puffs into the lungs every 6 (six) hours as needed.     albuterol (2.5 MG/3ML) 0.083% nebulizer solution  Commonly known as:  PROVENTIL  Take 2.5 mg by nebulization every 6 (six) hours as needed.           Follow-up Information   Follow up with Remus Loffler, PA-C. Schedule an appointment as soon as possible for a visit in 1 week. (recommend bmet to trend potassium level.)    Contact information:   [address] [place] [address] [phone]        The results of significant diagnostics from this hospitalization (including imaging, microbiology, ancillary and  laboratory) are listed below for reference.    Significant Diagnostic Studies: Dg Chest 2 View  12/03/2012  *RADIOLOGY REPORT*  Clinical Data: Cough, dizziness.  CHEST - 2 VIEW  Comparison: 02/28/2012  Findings: Lungs are hyperinflated with somewhat attenuated bronchovascular markings.  No focal infiltrate or overt edema. Chronic blunting of the right lateral costophrenic angle.  Heart size normal.  Atheromatous aorta.  Cervical fixation hardware is noted.  No effusion.  Spondylitic changes in the mid thoracic spine.  IMPRESSION:  1.  Hyperinflation without  acute or superimposed abnormality.   Original Report Authenticated By: D. Andria Rhein, MD     Microbiology: No results found for this or any previous visit (from the past 240 hour(s)).   Labs: Basic Metabolic Panel:  Recent Labs Lab 12/03/12 1350 12/04/12 1129  NA 141 136  K 3.3* 4.5  CL 98 97  CO2 35* 33*  GLUCOSE 104* 132*  BUN 6 9  CREATININE 0.82 0.73  CALCIUM 9.7 9.9   Liver Function Tests: No results found for this basename: AST, ALT, ALKPHOS, BILITOT, PROT, ALBUMIN,  in the last 168 hours No results found for this basename: LIPASE, AMYLASE,  in the last 168 hours No results found for this basename: AMMONIA,  in the last 168 hours CBC:  Recent Labs Lab 12/03/12 1350  WBC 7.0  NEUTROABS 4.7  HGB 13.0  HCT 40.1  MCV 90.1  PLT 232   Cardiac Enzymes:  Recent Labs Lab 12/03/12 1350  TROPONINI <0.30   BNP: BNP (last 3 results) No results found for this basename: PROBNP,  in the last 8760 hours CBG: No results found for this basename: GLUCAP,  in the last 168 hours     Signed:  Gwenyth Bender  [facility] 12/05/2012, 2:23 PM

## 2013-01-17 ENCOUNTER — Emergency Department (HOSPITAL_COMMUNITY)
Admission: EM | Admit: 2013-01-17 | Discharge: 2013-01-18 | Disposition: A | Discharge status: Home / Self Care | Payer: Medicare Other | Attending: Emergency Medicine | Admitting: Emergency Medicine

## 2013-01-17 ENCOUNTER — Encounter (HOSPITAL_COMMUNITY): Payer: Self-pay | Admitting: Emergency Medicine

## 2013-01-17 ENCOUNTER — Emergency Department (HOSPITAL_COMMUNITY): Payer: Medicare Other

## 2013-01-17 DIAGNOSIS — J441 Chronic obstructive pulmonary disease with (acute) exacerbation: Secondary | ICD-10-CM | POA: Insufficient documentation

## 2013-01-17 DIAGNOSIS — Z87442 Personal history of urinary calculi: Secondary | ICD-10-CM | POA: Insufficient documentation

## 2013-01-17 DIAGNOSIS — G8929 Other chronic pain: Secondary | ICD-10-CM | POA: Insufficient documentation

## 2013-01-17 DIAGNOSIS — R109 Unspecified abdominal pain: Secondary | ICD-10-CM

## 2013-01-17 DIAGNOSIS — Z79899 Other long term (current) drug therapy: Secondary | ICD-10-CM | POA: Insufficient documentation

## 2013-01-17 DIAGNOSIS — Z90710 Acquired absence of both cervix and uterus: Secondary | ICD-10-CM | POA: Insufficient documentation

## 2013-01-17 DIAGNOSIS — F17200 Nicotine dependence, unspecified, uncomplicated: Secondary | ICD-10-CM | POA: Insufficient documentation

## 2013-01-17 DIAGNOSIS — M549 Dorsalgia, unspecified: Secondary | ICD-10-CM | POA: Insufficient documentation

## 2013-01-17 DIAGNOSIS — F341 Dysthymic disorder: Secondary | ICD-10-CM | POA: Insufficient documentation

## 2013-01-17 DIAGNOSIS — K219 Gastro-esophageal reflux disease without esophagitis: Secondary | ICD-10-CM | POA: Insufficient documentation

## 2013-01-17 DIAGNOSIS — M542 Cervicalgia: Secondary | ICD-10-CM | POA: Insufficient documentation

## 2013-01-17 DIAGNOSIS — Z9981 Dependence on supplemental oxygen: Secondary | ICD-10-CM | POA: Insufficient documentation

## 2013-01-17 DIAGNOSIS — I10 Essential (primary) hypertension: Secondary | ICD-10-CM | POA: Insufficient documentation

## 2013-01-17 DIAGNOSIS — G4730 Sleep apnea, unspecified: Secondary | ICD-10-CM | POA: Insufficient documentation

## 2013-01-17 LAB — CBC WITH DIFFERENTIAL/PLATELET
Basophils Relative: 0 % (ref 0–1)
Eosinophils Absolute: 0.1 10*3/uL (ref 0.0–0.7)
Hemoglobin: 12.8 g/dL (ref 12.0–15.0)
Lymphs Abs: 0.9 10*3/uL (ref 0.7–4.0)
Monocytes Relative: 4 % (ref 3–12)
Neutro Abs: 5.6 10*3/uL (ref 1.7–7.7)
Neutrophils Relative %: 81 % — ABNORMAL HIGH (ref 43–77)
Platelets: 222 10*3/uL (ref 150–400)
RBC: 4.48 MIL/uL (ref 3.87–5.11)

## 2013-01-17 LAB — BASIC METABOLIC PANEL
BUN: 5 mg/dL — ABNORMAL LOW (ref 6–23)
Chloride: 92 mEq/L — ABNORMAL LOW (ref 96–112)
GFR calc Af Amer: 83 mL/min — ABNORMAL LOW (ref >90)
GFR calc non Af Amer: 71 mL/min — ABNORMAL LOW (ref >90)
Glucose, Bld: 130 mg/dL — ABNORMAL HIGH (ref 70–99)
Potassium: 3.5 mEq/L (ref 3.5–5.1)
Sodium: 134 mEq/L — ABNORMAL LOW (ref 135–145)

## 2013-01-17 MED ORDER — ONDANSETRON HCL 4 MG/2ML IJ SOLN
4.0000 mg | Freq: Once | INTRAMUSCULAR | Status: AC
  Administered 2013-01-17: 4 mg via INTRAVENOUS
  Filled 2013-01-17: qty 2

## 2013-01-17 MED ORDER — HYDROMORPHONE HCL PF 1 MG/ML IJ SOLN
0.5000 mg | Freq: Once | INTRAMUSCULAR | Status: AC
  Administered 2013-01-17: 22:00:00 via INTRAVENOUS
  Filled 2013-01-17: qty 1

## 2013-01-17 NOTE — ED Provider Notes (Signed)
History  This chart was scribed for Olivia Lennert, MD by Bennett Scrape, ED Scribe. This patient was seen in room [facility]14[facility]14 and the patient's care was started at 9:27 PM.  CSN: [id number]  Arrival date & time 01/17/13  2016   First MD Initiated Contact with Patient 01/17/13 2119      Chief Complaint  Patient presents with  . Flank Pain  . Back Pain    Patient is a 62 y.o. female presenting with flank pain. The history is provided by the patient. No language interpreter was used.  Flank Pain This is a new problem. The current episode started 12 to 24 hours ago. The problem occurs constantly. The problem has been gradually worsening. Pertinent negatives include no chest pain and no headaches. Nothing aggravates the symptoms. Nothing relieves the symptoms. She has tried nothing for the symptoms.    HPI Comments: Olivia Randall is a 62 y.o. female who presents to the Emergency Department complaining of gradual onset, gradually worsening, constant right flank pain that radiates around to the RLQ that started this morning. Pt denies having any modifying factors. She denies having a h/o kidney stones or prior episodes of the same. She has a h/o COPD, HTN and GERD. She is on 2L O2 at home continuously. She is a current everyday smoker but denies alcohol use.  Past Medical History  Diagnosis Date  . COPD (chronic obstructive pulmonary disease)   . Hypertension   . Sleep apnea   . Depression   . On home O2     2L N/C  . Chronic back pain   . Anxiety and depression   . GERD (gastroesophageal reflux disease)   . Chronic neck pain     Past Surgical History  Procedure Laterality Date  . Abdominal hysterectomy    . Bladder surgery    . Breast surgery    . Hemorrhoid surgery    . Carpal tunnel release    . Neck surgery    . Shoulder surgery Right     Family History  Problem Relation Age of Onset  . Diabetes Mother   . Diabetes Father   . Diabetes Brother     History   Substance Use Topics  . Smoking status: Current Every Day Smoker    Types: Cigarettes  . Smokeless tobacco: Not on file  . Alcohol Use: No    No OB history provided.  Review of Systems  Constitutional: Negative for appetite change and fatigue.  HENT: Negative for congestion, sinus pressure and ear discharge.   Eyes: Negative for discharge.  Respiratory: Negative for cough.   Cardiovascular: Negative for chest pain.  Gastrointestinal: Negative for nausea, vomiting and diarrhea.  Genitourinary: Positive for flank pain. Negative for frequency and hematuria.  Musculoskeletal: Negative for back pain.  Skin: Negative for rash.  Neurological: Negative for seizures and headaches.  Psychiatric/Behavioral: Negative for hallucinations.    Allergies  Review of patient's allergies indicates no known allergies.  Home Medications   Current Outpatient Rx  Name  Route  Sig  Dispense  Refill  . albuterol (PROAIR HFA) 108 (90 BASE) MCG/ACT inhaler   Inhalation   Inhale 2 puffs into the lungs every 6 (six) hours as needed.         [name] albuterol (PROVENTIL) (2.5 MG/3ML) 0.083% nebulizer solution   Nebulization   Take 2.5 mg by nebulization every 6 (six) hours as needed.         . ALPRAZolam (XANAX) 0.5  MG tablet   Oral   Take 0.5 mg by mouth 3 (three) times daily as needed for sleep or anxiety.          [name] alum & mag hydroxide-simeth (MAALOX/MYLANTA) 200-200-20 MG/5ML suspension   Oral   Take 30 mLs by mouth every 6 (six) hours as needed.   355 mL      . clonazePAM (KLONOPIN) 0.5 MG tablet   Oral   Take 0.5 mg by mouth at bedtime.         . cyclobenzaprine (FLEXERIL) 10 MG tablet   Oral   Take 10 mg by mouth 3 (three) times daily as needed for muscle spasms.          . furosemide (LASIX) 20 MG tablet   Oral   Take 20 mg by mouth daily.         [name] HYDROcodone-acetaminophen (NORCO) 10-325 MG per tablet   Oral   Take 1 tablet by mouth every 6 (six) hours as needed for  pain.          . hydrOXYzine (ATARAX/VISTARIL) 25 MG tablet   Oral   Take 25 mg by mouth 2 (two) times daily.         [name] ipratropium (ATROVENT) 0.02 % nebulizer solution   Nebulization   Take 500 mcg by nebulization every 6 (six) hours as needed for wheezing (*To be mixed with Albuterol for treatments via nebulization*).         [name] loratadine (CLARITIN) 10 MG tablet   Oral   Take 10 mg by mouth every morning.          . meclizine (ANTIVERT) 25 MG tablet   Oral   Take 1 tablet (25 mg total) by mouth daily.   30 tablet   0   . mometasone-formoterol (DULERA) 100-5 MCG/ACT AERO   Inhalation   Inhale 2 puffs into the lungs 2 (two) times daily.   1 Inhaler   0   . omeprazole (PRILOSEC) 20 MG capsule   Oral   Take 20 mg by mouth 2 (two) times daily.          [name] PARoxetine (PAXIL) 40 MG tablet   Oral   Take 40 mg by mouth every morning.         . potassium chloride (K-DUR,KLOR-CON) 10 MEQ tablet   Oral   Take 1 tablet (10 mEq total) by mouth daily.   30 tablet   0   . predniSONE (DELTASONE) 10 MG tablet      Take 4 tabs for 3 days, take 2 tabs for 3 days, take 1 tab for 3 days then stop.   21 tablet   0     Triage Vitals: BP 150/75  Pulse 84  Temp(Src) 97.8 F (36.6 C) (Oral)  Resp 22  Ht 5\' 4"  (1.626 m)  Wt 156 lb (70.761 kg)  BMI 26.76 kg/m2  SpO2 100%  Physical Exam  Nursing note and vitals reviewed. Constitutional: She is oriented to person, place, and time. She appears well-developed and well-nourished.  HENT:  Head: Normocephalic and atraumatic.  Eyes: Conjunctivae and EOM are normal. No scleral icterus.  Neck: Neck supple. No thyromegaly present.  Cardiovascular: Normal rate and regular rhythm.  Exam reveals no gallop and no friction rub.   No murmur heard. Pulmonary/Chest: Effort normal. No stridor. She has wheezes (minimal wheezing bilaterally). She has no rales. She exhibits no tenderness.  Abdominal: She exhibits no distension. There is  tenderness (minimal RLQ tenderness). There is no rebound.  Musculoskeletal: Normal range of motion. She exhibits no edema.  Moderate right flank tenderness  Lymphadenopathy:    She has no cervical adenopathy.  Neurological: She is alert and oriented to person, place, and time. Coordination normal.  Skin: No rash noted. No erythema.  Psychiatric: She has a normal mood and affect. Her behavior is normal.    ED Course  Procedures (including critical care time)  Medications  ondansetron (ZOFRAN) injection 4 mg (not administered)  HYDROmorphone (DILAUDID) injection 0.5 mg (not administered)   DIAGNOSTIC STUDIES: Oxygen Saturation is 100% on room air, normal by my interpretation.    COORDINATION OF CARE: 9:33 PM-Discussed treatment plan which includes medications, CT of abdomen, CBC panel and BMP with pt at bedside and pt agreed to plan.   Labs Reviewed - No data to display No results found.   No diagnosis found.    MDM      The chart was scribed for me under my direct supervision.  I personally performed the history, physical, and medical decision making and all procedures in the evaluation of this patient.Olivia Lennert, MD 01/19/13 [phone]

## 2013-01-17 NOTE — ED Notes (Signed)
Pt c/o rt low abd pain and back pain since this am.

## 2013-01-18 LAB — URINE MICROSCOPIC-ADD ON

## 2013-01-18 LAB — URINALYSIS, ROUTINE W REFLEX MICROSCOPIC
Bilirubin Urine: NEGATIVE
Leukocytes, UA: NEGATIVE
Nitrite: NEGATIVE
Specific Gravity, Urine: 1.01 (ref 1.005–1.030)
Urobilinogen, UA: 0.2 mg/dL (ref 0.0–1.0)
pH: 5.5 (ref 5.0–8.0)

## 2013-01-18 MED ORDER — HYDROCODONE-ACETAMINOPHEN 5-325 MG PO TABS: 1.0000 | ORAL_TABLET | Freq: Four times a day (QID) | ORAL | Status: DC | PRN

## 2013-01-18 MED ORDER — ONDANSETRON HCL 4 MG/2ML IJ SOLN
4.0000 mg | Freq: Once | INTRAMUSCULAR | Status: AC
  Administered 2013-01-18: 4 mg via INTRAVENOUS
  Filled 2013-01-18: qty 2

## 2013-01-18 MED ORDER — HYDROMORPHONE HCL PF 1 MG/ML IJ SOLN
0.5000 mg | Freq: Once | INTRAMUSCULAR | Status: AC
  Administered 2013-01-18: via INTRAVENOUS
  Filled 2013-01-18: qty 1

## 2013-01-18 NOTE — Discharge Instructions (Signed)
Follow up with your md as needed °

## 2013-05-20 ENCOUNTER — Encounter (HOSPITAL_COMMUNITY): Payer: Self-pay

## 2013-05-20 ENCOUNTER — Inpatient Hospital Stay (HOSPITAL_COMMUNITY)
Admission: EM | Admit: 2013-05-20 | Discharge: 2013-05-22 | DRG: 190 | Disposition: A | Discharge status: Home / Self Care | Payer: Medicare Other | Attending: Internal Medicine | Admitting: Internal Medicine

## 2013-05-20 ENCOUNTER — Emergency Department (HOSPITAL_COMMUNITY): Payer: Medicare Other

## 2013-05-20 DIAGNOSIS — F3289 Other specified depressive episodes: Secondary | ICD-10-CM | POA: Diagnosis present

## 2013-05-20 DIAGNOSIS — F17200 Nicotine dependence, unspecified, uncomplicated: Secondary | ICD-10-CM

## 2013-05-20 DIAGNOSIS — G8929 Other chronic pain: Secondary | ICD-10-CM

## 2013-05-20 DIAGNOSIS — Z9981 Dependence on supplemental oxygen: Secondary | ICD-10-CM

## 2013-05-20 DIAGNOSIS — J9610 Chronic respiratory failure, unspecified whether with hypoxia or hypercapnia: Secondary | ICD-10-CM

## 2013-05-20 DIAGNOSIS — F411 Generalized anxiety disorder: Secondary | ICD-10-CM | POA: Diagnosis present

## 2013-05-20 DIAGNOSIS — Z79899 Other long term (current) drug therapy: Secondary | ICD-10-CM

## 2013-05-20 DIAGNOSIS — J441 Chronic obstructive pulmonary disease with (acute) exacerbation: Principal | ICD-10-CM

## 2013-05-20 DIAGNOSIS — I10 Essential (primary) hypertension: Secondary | ICD-10-CM | POA: Diagnosis present

## 2013-05-20 DIAGNOSIS — E669 Obesity, unspecified: Secondary | ICD-10-CM | POA: Diagnosis present

## 2013-05-20 DIAGNOSIS — E876 Hypokalemia: Secondary | ICD-10-CM

## 2013-05-20 DIAGNOSIS — G4733 Obstructive sleep apnea (adult) (pediatric): Secondary | ICD-10-CM | POA: Diagnosis present

## 2013-05-20 DIAGNOSIS — Z6825 Body mass index (BMI) 25.0-25.9, adult: Secondary | ICD-10-CM

## 2013-05-20 DIAGNOSIS — F329 Major depressive disorder, single episode, unspecified: Secondary | ICD-10-CM | POA: Diagnosis present

## 2013-05-20 DIAGNOSIS — Z7902 Long term (current) use of antithrombotics/antiplatelets: Secondary | ICD-10-CM

## 2013-05-20 DIAGNOSIS — J9620 Acute and chronic respiratory failure, unspecified whether with hypoxia or hypercapnia: Secondary | ICD-10-CM | POA: Diagnosis present

## 2013-05-20 DIAGNOSIS — M549 Dorsalgia, unspecified: Secondary | ICD-10-CM | POA: Diagnosis present

## 2013-05-20 DIAGNOSIS — M542 Cervicalgia: Secondary | ICD-10-CM | POA: Diagnosis present

## 2013-05-20 DIAGNOSIS — K219 Gastro-esophageal reflux disease without esophagitis: Secondary | ICD-10-CM

## 2013-05-20 DIAGNOSIS — R42 Dizziness and giddiness: Secondary | ICD-10-CM

## 2013-05-20 LAB — CBC WITH DIFFERENTIAL/PLATELET
Basophils Absolute: 0 10*3/uL (ref 0.0–0.1)
Basophils Relative: 0 % (ref 0–1)
Eosinophils Absolute: 0 10*3/uL (ref 0.0–0.7)
Eosinophils Relative: 0 % (ref 0–5)
HCT: 37.5 % (ref 36.0–46.0)
Lymphocytes Relative: 6 % — ABNORMAL LOW (ref 12–46)
MCH: 28.2 pg (ref 26.0–34.0)
MCHC: 32 g/dL (ref 30.0–36.0)
MCV: 88.2 fL (ref 78.0–100.0)
Monocytes Absolute: 0.7 10*3/uL (ref 0.1–1.0)
Platelets: 210 10*3/uL (ref 150–400)
RDW: 14 % (ref 11.5–15.5)

## 2013-05-20 LAB — BASIC METABOLIC PANEL
CO2: 34 mEq/L — ABNORMAL HIGH (ref 19–32)
Calcium: 10 mg/dL (ref 8.4–10.5)
Creatinine, Ser: 0.74 mg/dL (ref 0.50–1.10)
GFR calc non Af Amer: 89 mL/min — ABNORMAL LOW (ref >90)
Sodium: 138 mEq/L (ref 135–145)

## 2013-05-20 LAB — LACTIC ACID, PLASMA: Lactic Acid, Venous: 0.9 mmol/L (ref 0.5–2.2)

## 2013-05-20 LAB — TROPONIN I: Troponin I: <0.3 ng/mL (ref <0.30)

## 2013-05-20 MED ORDER — LEVOFLOXACIN IN D5W 500 MG/100ML IV SOLN
INTRAVENOUS | Status: AC
  Filled 2013-05-20: qty 100

## 2013-05-20 MED ORDER — IPRATROPIUM BROMIDE 0.02 % IN SOLN
0.5000 mg | RESPIRATORY_TRACT | Status: DC
  Administered 2013-05-21 – 2013-05-22 (×9): 0.5 mg via RESPIRATORY_TRACT
  Filled 2013-05-20 (×9): qty 2.5

## 2013-05-20 MED ORDER — MELOXICAM 7.5 MG PO TABS
7.5000 mg | ORAL_TABLET | Freq: Every morning | ORAL | Status: DC
  Administered 2013-05-21 – 2013-05-22 (×2): 7.5 mg via ORAL
  Filled 2013-05-20 (×4): qty 1

## 2013-05-20 MED ORDER — IPRATROPIUM BROMIDE 0.02 % IN SOLN
RESPIRATORY_TRACT | Status: AC
  Administered 2013-05-20: 0.5 mg
  Filled 2013-05-20: qty 2.5

## 2013-05-20 MED ORDER — ALBUTEROL SULFATE (5 MG/ML) 0.5% IN NEBU: 2.5000 mg | INHALATION_SOLUTION | RESPIRATORY_TRACT | Status: DC | PRN

## 2013-05-20 MED ORDER — LEVOFLOXACIN IN D5W 500 MG/100ML IV SOLN
500.0000 mg | INTRAVENOUS | Status: DC
  Administered 2013-05-20 – 2013-05-21 (×2): 500 mg via INTRAVENOUS
  Filled 2013-05-20 (×2): qty 100

## 2013-05-20 MED ORDER — CYCLOBENZAPRINE HCL 10 MG PO TABS
10.0000 mg | ORAL_TABLET | Freq: Three times a day (TID) | ORAL | Status: DC | PRN
  Filled 2013-05-20: qty 1

## 2013-05-20 MED ORDER — ALBUTEROL SULFATE (5 MG/ML) 0.5% IN NEBU: 2.5000 mg | INHALATION_SOLUTION | RESPIRATORY_TRACT | Status: DC

## 2013-05-20 MED ORDER — SODIUM CHLORIDE 0.9 % IV SOLN: INTRAVENOUS | Status: DC

## 2013-05-20 MED ORDER — DONEPEZIL HCL 5 MG PO TABS
5.0000 mg | ORAL_TABLET | Freq: Every morning | ORAL | Status: DC
  Administered 2013-05-21 – 2013-05-22 (×2): 5 mg via ORAL
  Filled 2013-05-20 (×2): qty 1

## 2013-05-20 MED ORDER — PAROXETINE HCL 20 MG PO TABS
40.0000 mg | ORAL_TABLET | Freq: Every day | ORAL | Status: DC
  Administered 2013-05-21 – 2013-05-22 (×2): 40 mg via ORAL
  Filled 2013-05-20 (×2): qty 2

## 2013-05-20 MED ORDER — LISINOPRIL 10 MG PO TABS
10.0000 mg | ORAL_TABLET | Freq: Every day | ORAL | Status: DC
Start: 2013-05-21 — End: 2013-05-22
  Administered 2013-05-21 – 2013-05-22 (×2): 10 mg via ORAL
  Filled 2013-05-20 (×2): qty 1

## 2013-05-20 MED ORDER — ENOXAPARIN SODIUM 40 MG/0.4ML ~~LOC~~ SOLN
40.0000 mg | SUBCUTANEOUS | Status: DC
  Administered 2013-05-20 – 2013-05-21 (×2): 40 mg via SUBCUTANEOUS
  Filled 2013-05-20 (×2): qty 0.4

## 2013-05-20 MED ORDER — OXYCODONE HCL 5 MG PO TABS
5.0000 mg | ORAL_TABLET | ORAL | Status: DC | PRN
  Administered 2013-05-21 – 2013-05-22 (×3): 5 mg via ORAL
  Filled 2013-05-20 (×3): qty 1

## 2013-05-20 MED ORDER — ONDANSETRON HCL 4 MG/2ML IJ SOLN: 4.0000 mg | INTRAMUSCULAR | Status: DC | PRN

## 2013-05-20 MED ORDER — ALBUTEROL SULFATE (5 MG/ML) 0.5% IN NEBU
2.5000 mg | INHALATION_SOLUTION | RESPIRATORY_TRACT | Status: DC
  Administered 2013-05-21 – 2013-05-22 (×9): 2.5 mg via RESPIRATORY_TRACT
  Filled 2013-05-20 (×9): qty 0.5

## 2013-05-20 MED ORDER — IPRATROPIUM BROMIDE 0.02 % IN SOLN
1.0000 mg | Freq: Once | RESPIRATORY_TRACT | Status: AC
  Administered 2013-05-20: 1 mg via RESPIRATORY_TRACT
  Filled 2013-05-20: qty 2.5

## 2013-05-20 MED ORDER — TRAZODONE HCL 50 MG PO TABS
50.0000 mg | ORAL_TABLET | Freq: Every evening | ORAL | Status: DC | PRN
  Administered 2013-05-21: 50 mg via ORAL
  Filled 2013-05-20: qty 1

## 2013-05-20 MED ORDER — NICOTINE 21 MG/24HR TD PT24
21.0000 mg | MEDICATED_PATCH | Freq: Every day | TRANSDERMAL | Status: DC
  Administered 2013-05-20 – 2013-05-22 (×3): 21 mg via TRANSDERMAL
  Filled 2013-05-20 (×3): qty 1

## 2013-05-20 MED ORDER — LORATADINE 10 MG PO TABS
10.0000 mg | ORAL_TABLET | Freq: Every morning | ORAL | Status: DC
  Administered 2013-05-21 – 2013-05-22 (×2): 10 mg via ORAL
  Filled 2013-05-20 (×2): qty 1

## 2013-05-20 MED ORDER — CLONAZEPAM 0.5 MG PO TABS
0.5000 mg | ORAL_TABLET | Freq: Three times a day (TID) | ORAL | Status: DC | PRN
  Administered 2013-05-21: 0.5 mg via ORAL
  Filled 2013-05-20 (×2): qty 1

## 2013-05-20 MED ORDER — IPRATROPIUM BROMIDE 0.02 % IN SOLN: 0.5000 mg | RESPIRATORY_TRACT | Status: DC

## 2013-05-20 MED ORDER — METHYLPREDNISOLONE SODIUM SUCC 125 MG IJ SOLR
125.0000 mg | Freq: Once | INTRAMUSCULAR | Status: AC
  Administered 2013-05-20: 125 mg via INTRAVENOUS
  Filled 2013-05-20: qty 2

## 2013-05-20 MED ORDER — METHYLPREDNISOLONE SODIUM SUCC 125 MG IJ SOLR
125.0000 mg | Freq: Four times a day (QID) | INTRAMUSCULAR | Status: DC
  Administered 2013-05-21 – 2013-05-22 (×6): 125 mg via INTRAVENOUS
  Filled 2013-05-20 (×6): qty 2

## 2013-05-20 MED ORDER — POTASSIUM CHLORIDE IN NACL 20-0.9 MEQ/L-% IV SOLN
INTRAVENOUS | Status: DC
  Administered 2013-05-20 – 2013-05-21 (×3): via INTRAVENOUS

## 2013-05-20 MED ORDER — ACETAMINOPHEN 325 MG PO TABS
650.0000 mg | ORAL_TABLET | ORAL | Status: DC | PRN
  Administered 2013-05-21 (×2): 650 mg via ORAL
  Filled 2013-05-20 (×2): qty 2

## 2013-05-20 MED ORDER — CLOPIDOGREL BISULFATE 75 MG PO TABS
75.0000 mg | ORAL_TABLET | Freq: Every day | ORAL | Status: DC
  Administered 2013-05-20 – 2013-05-21 (×2): 75 mg via ORAL
  Filled 2013-05-20 (×2): qty 1

## 2013-05-20 MED ORDER — PANTOPRAZOLE SODIUM 40 MG PO TBEC
40.0000 mg | DELAYED_RELEASE_TABLET | Freq: Two times a day (BID) | ORAL | Status: DC
  Administered 2013-05-20 – 2013-05-22 (×4): 40 mg via ORAL
  Filled 2013-05-20 (×4): qty 1

## 2013-05-20 MED ORDER — ALBUTEROL (5 MG/ML) CONTINUOUS INHALATION SOLN
10.0000 mg/h | INHALATION_SOLUTION | Freq: Once | RESPIRATORY_TRACT | Status: AC
  Administered 2013-05-20: 10 mg/h via RESPIRATORY_TRACT
  Filled 2013-05-20: qty 20

## 2013-05-20 MED ORDER — ASPIRIN 325 MG PO TABS
325.0000 mg | ORAL_TABLET | Freq: Every morning | ORAL | Status: DC
  Administered 2013-05-21 – 2013-05-22 (×2): 325 mg via ORAL
  Filled 2013-05-20 (×2): qty 1

## 2013-05-20 MED ORDER — ALBUTEROL SULFATE (5 MG/ML) 0.5% IN NEBU
2.5000 mg | INHALATION_SOLUTION | RESPIRATORY_TRACT | Status: DC
  Administered 2013-05-20: 2.5 mg via RESPIRATORY_TRACT
  Filled 2013-05-20: qty 0.5

## 2013-05-20 MED ORDER — SODIUM CHLORIDE 0.9 % IV SOLN
INTRAVENOUS | Status: DC
  Administered 2013-05-20: 17:00:00 via INTRAVENOUS

## 2013-05-20 MED ORDER — GUAIFENESIN ER 600 MG PO TB12
1200.0000 mg | ORAL_TABLET | Freq: Two times a day (BID) | ORAL | Status: DC
Start: 2013-05-20 — End: 2013-05-22
  Administered 2013-05-20 – 2013-05-22 (×4): 1200 mg via ORAL
  Filled 2013-05-20: qty 1
  Filled 2013-05-20 (×3): qty 2
  Filled 2013-05-20: qty 1
  Filled 2013-05-20: qty 2

## 2013-05-20 NOTE — ED Notes (Signed)
Pt presents via EMS secondary to SOB x 24 hrs. Pt reports taking 2 albuterol treatments at home, without success. EMS administered another albuterol treatment and 125 mg solumedrol while in route. Pt reports having increased cough past 2 days with green sputum. None seen at this time. Pt sitting up straight breathing with pursed lips.  Pt also reports fever at home or feeling hot. RTT called to bedside.

## 2013-05-20 NOTE — ED Provider Notes (Signed)
CSN: [id number]     Arrival date & time 05/20/13  1639 History   First MD Initiated Contact with Patient 05/20/13 1640     Chief Complaint  Patient presents with  . Shortness of Breath    HPI Pt was seen at 1640.  Per pt, c/o gradual onset and worsening of persistent cough, wheezing and SOB for the past 2 days, worse today. Has been associated with subjective home fevers/chills and generalized chest "tightness." Describes her symptoms as "my COPD is acting up."  Has been using home MDI and nebs without relief. Describes the cough as productive of "green" sputum. Pt has significant hx of COPD, on home O2 N/C, and continues to smoke cigarettes. Denies CP/palpitations, no back pain, no abd pain, no N/V/D, no fevers, no rash.     Past Medical History  Diagnosis Date  . COPD (chronic obstructive pulmonary disease)   . Hypertension   . Sleep apnea   . Depression   . On home O2     2L N/C  . Chronic back pain   . Anxiety and depression   . GERD (gastroesophageal reflux disease)   . Chronic neck pain    Past Surgical History  Procedure Laterality Date  . Abdominal hysterectomy    . Bladder surgery    . Breast surgery    . Hemorrhoid surgery    . Carpal tunnel release    . Neck surgery    . Shoulder surgery Right    Family History  Problem Relation Age of Onset  . Diabetes Mother   . Diabetes Father   . Diabetes Brother    History  Substance Use Topics  . Smoking status: Current Every Day Smoker    Types: Cigarettes  . Smokeless tobacco: Not on file  . Alcohol Use: No    Review of Systems ROS: Statement: All systems negative except as marked or noted in the HPI; Constitutional: +subjective home fevers and chills. ; ; Eyes: Negative for eye pain, redness and discharge. ; ; ENMT: Negative for ear pain, hoarseness, nasal congestion, sinus pressure and sore throat. ; ; Cardiovascular: +CP. Negative for palpitations, diaphoresis, peripheral edema. ; ; Respiratory: +SOB, wheezing,  cough. Negative for stridor. ; ; Gastrointestinal: Negative for nausea, vomiting, diarrhea, abdominal pain, blood in stool, hematemesis, jaundice and rectal bleeding. . ; ; Genitourinary: Negative for dysuria, flank pain and hematuria. ; ; Musculoskeletal: Negative for back pain and neck pain. Negative for swelling and trauma.; ; Skin: Negative for pruritus, rash, abrasions, blisters, bruising and skin lesion.; ; Neuro: Negative for headache, lightheadedness and neck stiffness. Negative for weakness, altered level of consciousness , altered mental status, extremity weakness, paresthesias, involuntary movement, seizure and syncope.       Allergies  Review of patient's allergies indicates no known allergies.  Home Medications   Current Outpatient Rx  Name  Route  Sig  Dispense  Refill  . albuterol (PROAIR HFA) 108 (90 BASE) MCG/ACT inhaler   Inhalation   Inhale 2 puffs into the lungs every 6 (six) hours as needed for shortness of breath.          [name] albuterol (PROVENTIL) (2.5 MG/3ML) 0.083% nebulizer solution   Nebulization   Take 2.5 mg by nebulization every 6 (six) hours as needed for shortness of breath.          . ALPRAZolam (XANAX) 0.5 MG tablet   Oral   Take 0.5 mg by mouth 3 (three) times daily as needed for  sleep or anxiety.          . clonazePAM (KLONOPIN) 0.5 MG tablet   Oral   Take 0.5 mg by mouth at bedtime.         . cyclobenzaprine (FLEXERIL) 10 MG tablet   Oral   Take 10 mg by mouth 3 (three) times daily as needed for muscle spasms.          . furosemide (LASIX) 20 MG tablet   Oral   Take 20 mg by mouth daily.         [name] HYDROcodone-acetaminophen (NORCO) 10-325 MG per tablet   Oral   Take 1 tablet by mouth every 6 (six) hours as needed for pain.          [name] HYDROcodone-acetaminophen (NORCO/VICODIN) 5-325 MG per tablet   Oral   Take 1 tablet by mouth every 6 (six) hours as needed for pain.   30 tablet   0   . hydrOXYzine (ATARAX/VISTARIL) 25 MG  tablet   Oral   Take 25 mg by mouth 2 (two) times daily.         [name] ipratropium (ATROVENT) 0.02 % nebulizer solution   Nebulization   Take 500 mcg by nebulization every 6 (six) hours as needed for wheezing (*To be mixed with Albuterol for treatments via nebulization*).         [name] loratadine (CLARITIN) 10 MG tablet   Oral   Take 10 mg by mouth every morning.          . meclizine (ANTIVERT) 25 MG tablet   Oral   Take 1 tablet (25 mg total) by mouth daily.   30 tablet   0   . omeprazole (PRILOSEC) 20 MG capsule   Oral   Take 20 mg by mouth 2 (two) times daily.          [name] PARoxetine (PAXIL) 40 MG tablet   Oral   Take 40 mg by mouth every morning.          SpO2 89% Physical Exam 1645: Physical examination:  Nursing notes reviewed; Vital signs and O2 SAT reviewed;  Constitutional: Well developed, Well nourished, Uncomfortable appearing; Head:  Normocephalic, atraumatic; Eyes: EOMI, PERRL, No scleral icterus; ENMT: Mouth and pharynx normal, Mucous membranes dry;; Neck: Supple, Full range of motion, No lymphadenopathy; Cardiovascular: Tachycardic rate and rhythm, No gallop; Respiratory: Breath sounds diminished & equal bilaterally, faint wheezes. No audible wheezing. Sitting upright. Speaking in phrases. Tachypneic; Chest: Nontender, Movement normal; Abdomen: Soft, Nontender, Nondistended, Normal bowel sounds; Genitourinary: No CVA tenderness; Extremities: Pulses normal, No tenderness, No edema, No calf edema or asymmetry.; Neuro: AA&Ox3, Major CN grossly intact.  Speech clear. No gross focal motor or sensory deficits in extremities.; Skin: Color normal, Warm, Dry.   ED Course  Procedures   1650:  Pt with Sats 89% R/A on arrival. Tachypneic, tachycardic, lungs diminished bilat. IV solumedrol and continuous neb ordered.  1735:  Sats 95% on neb. Lungs continue diminished bilat. Will admit for exacerbation COPD. Dx and testing d/w pt.  Questions answered.  Verb understanding,  agreeable to admit.  1810:  Sats 91-92% on O2 3L N/C. T/C to [facility]. Karilyn Cota, case discussed, including:  HPI, pertinent PM/SHx, VS/PE, dx testing, ED course and treatment:  Agreeable to admit, requests to write temporary orders, obtain medical bed to team 2.    MDM  MDM Reviewed: previous chart, nursing note and vitals Reviewed previous: labs and ECG Interpretation: labs, ECG and x-ray Total  time providing critical care: 30-74 minutes. This excludes time spent performing separately reportable procedures and services. Consults: admitting MD   CRITICAL CARE Performed by: Laray Anger Total critical care time: 40 Critical care time was exclusive of separately billable procedures and treating other patients. Critical care was necessary to treat or prevent imminent or life-threatening deterioration. Critical care was time spent personally by me on the following activities: development of treatment plan with patient and/or surrogate as well as nursing, discussions with consultants, evaluation of patient's response to treatment, examination of patient, obtaining history from patient or surrogate, ordering and performing treatments and interventions, ordering and review of laboratory studies, ordering and review of radiographic studies, pulse oximetry and re-evaluation of patient's condition.    Date: 05/20/2013  Rate: 103  Rhythm: sinus tachycardia  QRS Axis: normal  Intervals: normal  ST/T Wave abnormalities: nonspecific T wave changes, artifact  Conduction Disutrbances:left posterior fascicular block  Narrative Interpretation:   Old EKG Reviewed: changes noted; new LPFB new since previous EKG dated 12/03/2012.  Results for orders placed during the hospital encounter of 05/20/13  CBC WITH DIFFERENTIAL      Result Value Range   WBC 11.4 (*) 4.0 - 10.5 K/uL   RBC 4.25  3.87 - 5.11 MIL/uL   Hemoglobin 12.0  12.0 - 15.0 g/dL   HCT 40.9  81.1 - 91.4 %   MCV 88.2  78.0 - 100.0 fL    MCH 28.2  26.0 - 34.0 pg   MCHC 32.0  30.0 - 36.0 g/dL   RDW 78.2  95.6 - 21.3 %   Platelets 210  150 - 400 K/uL   Neutrophils Relative % 87 (*) 43 - 77 %   Neutro Abs 10.0 (*) 1.7 - 7.7 K/uL   Lymphocytes Relative 6 (*) 12 - 46 %   Lymphs Abs 0.7  0.7 - 4.0 K/uL   Monocytes Relative 6  3 - 12 %   Monocytes Absolute 0.7  0.1 - 1.0 K/uL   Eosinophils Relative 0  0 - 5 %   Eosinophils Absolute 0.0  0.0 - 0.7 K/uL   Basophils Relative 0  0 - 1 %   Basophils Absolute 0.0  0.0 - 0.1 K/uL  BASIC METABOLIC PANEL      Result Value Range   Sodium 138  135 - 145 mEq/L   Potassium 3.4 (*) 3.5 - 5.1 mEq/L   Chloride 97  96 - 112 mEq/L   CO2 34 (*) 19 - 32 mEq/L   Glucose, Bld 124 (*) 70 - 99 mg/dL   BUN 6  6 - 23 mg/dL   Creatinine, Ser 0.86  0.50 - 1.10 mg/dL   Calcium 57.8  8.4 - 46.9 mg/dL   GFR calc non Af Amer 89 (*) >90 mL/min   GFR calc Af Amer >90  >90 mL/min  TROPONIN I      Result Value Range   Troponin I <0.30  <0.30 ng/mL  LACTIC ACID, PLASMA      Result Value Range   Lactic Acid, Venous 0.9  0.5 - 2.2 mmol/L  PRO B NATRIURETIC PEPTIDE      Result Value Range   Pro B Natriuretic peptide (BNP) 165.1 (*) 0 - 125 pg/mL   Dg Chest Portable 1 View 05/20/2013   *RADIOLOGY REPORT*  Clinical Data: Shortness of breath  PORTABLE CHEST - 1 VIEW  Comparison: 12/03/2012  Findings: Heart size is normal.  There are small pleural effusions identified.  Mild interstitial  edema is present.  No airspace consolidation.  There is flattening of the diaphragms and coarsened interstitial changes of COPD.  IMPRESSION:  1.  Findings compatible with CHF.   Original Report Authenticated By: Signa Kell, M.D.          Laray Anger, DO 05/22/13 2143

## 2013-05-20 NOTE — ED Notes (Signed)
Pt reports worsening sob since last night, normally is on 02 at 2l, green sputum and fever at home. Cp at times. Harder to breath today.

## 2013-05-20 NOTE — H&P (Signed)
[facility] History and Physical  Olivia Randall  AOZ:[id number]  DOB: 1950-11-17   DOA: 05/20/2013   PCP:   Remus Loffler, PA-C   Chief Complaint:  difficulty breathing since yesterday  HPI: Olivia Randall is a 62 y.o. female.   Caucasian lady with a history of COPD, continues to smoke half pack a day, has had persistent shortness of breath and cough productive of green sputum since yesterday despite continued use of her bronchodilators. Eventually she came to the emergency room but did not improve sufficiently with initial treatment on the hospitalist service was called.  She reports fever chills and upper respiratory symptoms, but no sick contacts  She reports progressive swelling of the lower extremities since for the past 2 weeks.  She has sleep apnea and is prescribed CPAP but the machine is broken so she hasn't been using Rewiew of Systems:   All systems negative except as marked bold or noted in the HPI;  Constitutional:    malaise, fever and chills. ;  Eyes:   eye pain, redness and discharge. ;  ENMT:   ear pain, hoarseness, nasal congestion, sinus pressure and sore throat. ;  Cardiovascular:    chest pain, palpitations, diaphoresis, dyspnea and peripheral edema.  Respiratory:   cough, hemoptysis, wheezing and stridor. ;  Gastrointestinal:  nausea, vomiting, diarrhea x 2 weeks, constipation, abdominal pain, melena, blood in stool, hematemesis, jaundice and rectal bleeding. unusual weight loss..   Genitourinary:    frequency, dysuria x 3 months, incontinence,flank pain and hematuria; Musculoskeletal:   back pain and neck pain, hip pain.  swelling and trauma.;  Skin: .  pruritus, rash, abrasions, bruising and skin lesion.; ulcerations Neuro:    headache, lightheadedness and neck stiffness.  weakness, altered level of consciousness, altered mental status, extremity weakness, burning feet, involuntary movement, seizure and syncope.  Psych:    anxiety, depression, insomnia,  tearfulness, panic attacks, hallucinations, paranoia, suicidal or homicidal ideation    Past Medical History  Diagnosis Date  . COPD (chronic obstructive pulmonary disease)   . Hypertension   . Sleep apnea   . Depression   . On home O2     2L N/C  . Chronic back pain   . Anxiety and depression   . GERD (gastroesophageal reflux disease)   . Chronic neck pain     Past Surgical History  Procedure Laterality Date  . Abdominal hysterectomy    . Bladder surgery    . Breast surgery    . Hemorrhoid surgery    . Carpal tunnel release    . Neck surgery    . Shoulder surgery Right     Medications:  HOME MEDS: Prior to Admission medications   Medication Sig Start Date End Date Taking? Authorizing Provider  albuterol (PROAIR HFA) 108 (90 BASE) MCG/ACT inhaler Inhale 2 puffs into the lungs every 6 (six) hours as needed for shortness of breath.    Yes Historical Provider, MD  albuterol (PROVENTIL) (2.5 MG/3ML) 0.083% nebulizer solution Take 2.5 mg by nebulization every 6 (six) hours as needed for shortness of breath (Mixed with Ipratropium).    Yes Historical Provider, MD  ALPRAZolam Prudy Feeler) 0.5 MG tablet Take 0.5 mg by mouth 3 (three) times daily as needed for sleep or anxiety.    Yes Historical Provider, MD  aspirin 325 MG tablet Take 325 mg by mouth every morning.   Yes Historical Provider, MD  clopidogrel (PLAVIX) 75 MG tablet Take 75 mg by mouth at bedtime.  Yes Historical Provider, MD  Cyanocobalamin (B-12) 50 MCG TABS Take 1 tablet by mouth daily.   Yes Historical Provider, MD  cyclobenzaprine (FLEXERIL) 10 MG tablet Take 10 mg by mouth 3 (three) times daily as needed for muscle spasms.    Yes Historical Provider, MD  donepezil (ARICEPT) 5 MG tablet Take 5 mg by mouth every morning.   Yes Historical Provider, MD  furosemide (LASIX) 20 MG tablet Take 20 mg by mouth daily.   Yes Historical Provider, MD  Chilton Si Tea, Camillia sinensis, (GREEN TEA PO) Take by mouth daily.   Yes  Historical Provider, MD  HYDROcodone-acetaminophen (NORCO) 10-325 MG per tablet Take 1 tablet by mouth 3 (three) times daily.    Yes Historical Provider, MD  hydrOXYzine (ATARAX/VISTARIL) 25 MG tablet Take 25 mg by mouth 2 (two) times daily.   Yes Historical Provider, MD  ipratropium (ATROVENT) 0.02 % nebulizer solution Take 500 mcg by nebulization every 6 (six) hours as needed for wheezing (*To be mixed with Albuterol for treatments via nebulization*).   Yes Historical Provider, MD  lisinopril (PRINIVIL,ZESTRIL) 10 MG tablet Take 10 mg by mouth daily.   Yes Historical Provider, MD  loperamide (ANTI-DIARRHEAL) 2 MG capsule Take 2 mg by mouth as needed for diarrhea or loose stools.   Yes Historical Provider, MD  loratadine (CLARITIN) 10 MG tablet Take 10 mg by mouth every morning.    Yes Historical Provider, MD  meloxicam (MOBIC) 7.5 MG tablet Take 7.5 mg by mouth every morning.   Yes Historical Provider, MD  omeprazole (PRILOSEC) 20 MG capsule Take 20 mg by mouth 2 (two) times daily.    Yes Historical Provider, MD  PARoxetine (PAXIL) 40 MG tablet Take 40 mg by mouth every morning.   Yes Historical Provider, MD     Allergies:  No Known Allergies  Social History:   reports that she has been smoking Cigarettes.  She has been smoking about 0.00 packs per day. She does not have any smokeless tobacco history on file. She reports that she does not drink alcohol or use illicit drugs.  Family History: Family History  Problem Relation Age of Onset  . Diabetes Mother   . Diabetes Father   . Diabetes Brother      Physical Exam: Filed Vitals:   05/20/13 1637 05/20/13 1700 05/20/13 1713 05/20/13 1900  BP:  141/95  116/61  Pulse:  106  102  Resp:  20  26  SpO2: 89% 95% 92% 94%   Blood pressure 116/61, pulse 102, resp. rate 26, SpO2 94.00%. There is no weight on file to calculate BMI.   GEN:  Pleasant middle-aged Caucasian lady lying bed in moderate respiratory  distress; cooperative with  exam PSYCH:  alert and oriented x4;  somewhat anxious; affect is appropriate. HEENT: Mucous membranes pink dry and anicteric; PERRLA; EOM intact; no cervical lymphadenopathy nor thyromegaly or carotid bruit;  Breasts:: Not examined CHEST WALL: No tenderness CHEST: Tachypneic; diffuse bilateral rhonchi; fine right basilar crackles HEART: Regular rate and rhythm; 1/6 systolic murmur BACK: No kyphosis no scoliosis; no CVA tenderness ABDOMEN: Obese, soft non-tender; no masses, no organomegaly, normal abdominal bowel sounds; Rectal Exam: Not done EXTREMITIES:  age-appropriate arthropathy of the hands and knees; no edema; no ulcerations. Genitalia: not examined PULSES: 2+ and symmetric SKIN: Normal hydration no rash or ulceration CNS: Cranial nerves 2-12 grossly intact no focal lateralizing neurologic deficit   Labs on Admission:  Basic Metabolic Panel:  Recent Labs Lab 05/20/13 1709  NA 138  K 3.4*  CL 97  CO2 34*  GLUCOSE 124*  BUN 6  CREATININE 0.74  CALCIUM 10.0   Liver Function Tests: No results found for this basename: AST, ALT, ALKPHOS, BILITOT, PROT, ALBUMIN,  in the last 168 hours No results found for this basename: LIPASE, AMYLASE,  in the last 168 hours No results found for this basename: AMMONIA,  in the last 168 hours CBC:  Recent Labs Lab 05/20/13 1709  WBC 11.4*  NEUTROABS 10.0*  HGB 12.0  HCT 37.5  MCV 88.2  PLT 210   Cardiac Enzymes:  Recent Labs Lab 05/20/13 1709  TROPONINI <0.30   BNP: No components found with this basename: POCBNP,  D-dimer: No components found with this basename: D-DIMER,  CBG: No results found for this basename: GLUCAP,  in the last 168 hours  Radiological Exams on Admission: Dg Chest Portable 1 View  05/20/2013   *RADIOLOGY REPORT*  Clinical Data: Shortness of breath  PORTABLE CHEST - 1 VIEW  Comparison: 12/03/2012  Findings: Heart size is normal.  There are small pleural effusions identified.  Mild interstitial edema is  present.  No airspace consolidation.  There is flattening of the diaphragms and coarsened interstitial changes of COPD.  IMPRESSION:  1.  Findings compatible with CHF.   Original Report Authenticated By: Signa Kell, M.D.     Assessment/Plan   Active Problems:   COPD with acute exacerbation   Chronic respiratory failure   Hypokalemia   Chronic back pain   GERD (gastroesophageal reflux disease)   Dizzy   OSA (obstructive sleep apnea)   Tobacco use disorder    PLAN: Admit for continued nebulization, intravenous steroid, and antibiotics Cautious IV fluid hydration Potassium replacement Proton pump inhibitor Nicotine cessation counseling and nicotine replacement Continue antianxiety and antidepressant medication Consult case manager for assistance with repairing or getting a new CPAP  Other plans as per orders.  Code Status: Full code Family Communication:  Plans discuss with patient and husband at bedside Disposition Plan: Home in a few days unless shows dramatic improvement   Raider Valbuena Nocturnist [facility] Pager [phone]  05/20/2013, 8:43 PM

## 2013-05-21 DIAGNOSIS — F17200 Nicotine dependence, unspecified, uncomplicated: Secondary | ICD-10-CM

## 2013-05-21 LAB — BASIC METABOLIC PANEL
CO2: 34 mEq/L — ABNORMAL HIGH (ref 19–32)
Calcium: 9.9 mg/dL (ref 8.4–10.5)
Chloride: 103 mEq/L (ref 96–112)
Creatinine, Ser: 0.7 mg/dL (ref 0.50–1.10)
GFR calc Af Amer: >90 mL/min (ref >90)
Sodium: 143 mEq/L (ref 135–145)

## 2013-05-21 LAB — HEMOGLOBIN A1C
Hgb A1c MFr Bld: 5.2 % (ref <5.7)
Mean Plasma Glucose: 103 mg/dL (ref <117)

## 2013-05-21 LAB — CBC
Platelets: 203 10*3/uL (ref 150–400)
RBC: 4.07 MIL/uL (ref 3.87–5.11)
RDW: 14.2 % (ref 11.5–15.5)
WBC: 11.5 10*3/uL — ABNORMAL HIGH (ref 4.0–10.5)

## 2013-05-21 MED ORDER — FUROSEMIDE 20 MG PO TABS
20.0000 mg | ORAL_TABLET | Freq: Every day | ORAL | Status: DC
  Administered 2013-05-21 – 2013-05-22 (×2): 20 mg via ORAL
  Filled 2013-05-21 (×3): qty 1

## 2013-05-21 NOTE — Progress Notes (Signed)
[facility] PROGRESS NOTE  SABRYNA LAHM WJX:[id number] DOB: 08-01-1951 DOA: 05/20/2013 PCP: Remus Loffler, PA-C  Assessment/Plan: COPD with acute exacerbation: minimal improvement this am. Will continue nebs scheduled and prn and with IV steroids. levaquin day #2. sats >90% on 2 L. Monitor closely    Acute on chronic respiratory failure: see #1.   Hypokalemia: likely related to daily lasix. Resolved this am. Will monitor.    Chronic back pain: at baseline. Continue home med.    GERD (gastroesophageal reflux disease): at baseline will continue home meds.    Dizzy: likely related to #1. Will check orthostatic vital signs.    OSA (obstructive sleep apnea): will request respiratory to provide. Will request case management to evaluate for resource for fixing home cpap machine.    Tobacco use disorder: counseled to stop.    Code Status: full Family Communication: husband at bedside Disposition Plan: home when ready hopefully tomorrow.   Consultants:  none  Procedures:  none  Antibiotics:  levaquin 05/20/13>>>  HPI/Subjective: Sitting up in bed. Denies pain discomfort  Objective: Filed Vitals:   05/21/13 1004  BP: 126/76  Pulse:   Temp:   Resp:     Intake/Output Summary (Last 24 hours) at 05/21/13 1005 Last data filed at 05/21/13 0100  Gross per 24 hour  Intake    280 ml  Output      0 ml  Net    280 ml   Filed Weights   05/20/13 2219 05/21/13 0727  Weight: 69.3 kg (152 lb 12.5 oz) 70.3 kg (154 lb 15.7 oz)    Exam:   General:  Well nourished NAD  Cardiovascular: RRR distant HS trace LE edema  Respiratory: moderate to severe increased work of breathing. BS very diminished particularly bilateral bases. Inspiratory and expiratory wheeze diffusely.   Abdomen: obese soft +BS non-tender to palpation  Musculoskeletal: no clubbing no cyanosis.    Data Reviewed: Basic Metabolic Panel:  Recent Labs Lab 05/20/13 1709 05/21/13 0538  NA 138 143  K  3.4* 4.2  CL 97 103  CO2 34* 34*  GLUCOSE 124* 156*  BUN 6 8  CREATININE 0.74 0.70  CALCIUM 10.0 9.9   Liver Function Tests: No results found for this basename: AST, ALT, ALKPHOS, BILITOT, PROT, ALBUMIN,  in the last 168 hours No results found for this basename: LIPASE, AMYLASE,  in the last 168 hours No results found for this basename: AMMONIA,  in the last 168 hours CBC:  Recent Labs Lab 05/20/13 1709 05/21/13 0538  WBC 11.4* 11.5*  NEUTROABS 10.0*  --   HGB 12.0 11.3*  HCT 37.5 36.4  MCV 88.2 89.4  PLT 210 203   Cardiac Enzymes:  Recent Labs Lab 05/20/13 1709  TROPONINI <0.30   BNP (last 3 results)  Recent Labs  05/20/13 1709  PROBNP 165.1*   CBG: No results found for this basename: GLUCAP,  in the last 168 hours  No results found for this or any previous visit (from the past 240 hour(s)).   Studies: Dg Chest Portable 1 View  05/20/2013   *RADIOLOGY REPORT*  Clinical Data: Shortness of breath  PORTABLE CHEST - 1 VIEW  Comparison: 12/03/2012  Findings: Heart size is normal.  There are small pleural effusions identified.  Mild interstitial edema is present.  No airspace consolidation.  There is flattening of the diaphragms and coarsened interstitial changes of COPD.  IMPRESSION:  1.  Findings compatible with CHF.   Original Report Authenticated By: Ladona Ridgel  Bradly Chris, M.D.    Scheduled Meds: . albuterol  2.5 mg Nebulization Q4H WA  . aspirin  325 mg Oral q morning - 10a  . clopidogrel  75 mg Oral QHS  . donepezil  5 mg Oral q morning - 10a  . enoxaparin (LOVENOX) injection  40 mg Subcutaneous Q24H  . furosemide  20 mg Oral Daily  . guaiFENesin  1,200 mg Oral BID  . ipratropium  0.5 mg Nebulization Q4H WA  . levofloxacin (LEVAQUIN) IV  500 mg Intravenous Q24H  . lisinopril  10 mg Oral Daily  . loratadine  10 mg Oral q morning - 10a  . meloxicam  7.5 mg Oral q morning - 10a  . methylPREDNISolone (SOLU-MEDROL) injection  125 mg Intravenous Q6H  . nicotine  21 mg  Transdermal Daily  . pantoprazole  40 mg Oral BID  . PARoxetine  40 mg Oral Daily   Continuous Infusions: . 0.9 % NaCl with KCl 20 mEq / L 20 mL/hr at 05/21/13 1003    Principal Problem:   COPD with acute exacerbation Active Problems:   Chronic respiratory failure   Hypokalemia   Chronic back pain   GERD (gastroesophageal reflux disease)   Dizzy   OSA (obstructive sleep apnea)   Tobacco use disorder    Time spent: 30 minutes    [facility] M  [facility] Pager [phone]. If 7PM-7AM, please contact night-coverage at [email or web address], password [facility] 05/21/2013, 10:05 AM  LOS: 1 day   Attending: Patient seen and examined. Agree with above management. Continue with IV steroids and antibiotics. She does not clinically have congestive heart failure as reported on her chest x-ray. I have counseled her regarding her tobacco abuse.

## 2013-05-21 NOTE — Progress Notes (Signed)
Utilization Review Complete  

## 2013-05-22 LAB — BASIC METABOLIC PANEL
CO2: 32 mEq/L (ref 19–32)
Chloride: 103 mEq/L (ref 96–112)
GFR calc Af Amer: >90 mL/min (ref >90)
Potassium: 4.4 mEq/L (ref 3.5–5.1)
Sodium: 141 mEq/L (ref 135–145)

## 2013-05-22 MED ORDER — GUAIFENESIN ER 600 MG PO TB12: 1200.0000 mg | ORAL_TABLET | Freq: Two times a day (BID) | ORAL | Status: DC

## 2013-05-22 MED ORDER — NICOTINE 21 MG/24HR TD PT24: 1.0000 | MEDICATED_PATCH | Freq: Every day | TRANSDERMAL | Status: DC

## 2013-05-22 MED ORDER — PREDNISONE 10 MG PO TABS: ORAL_TABLET | ORAL | Status: DC

## 2013-05-22 MED ORDER — LEVOFLOXACIN 500 MG PO TABS: 500.0000 mg | ORAL_TABLET | Freq: Every day | ORAL | Status: DC

## 2013-05-22 MED ORDER — OXYCODONE HCL 5 MG PO TABS: 5.0000 mg | ORAL_TABLET | Freq: Four times a day (QID) | ORAL | Status: DC | PRN

## 2013-05-22 MED ORDER — DOCUSATE SODIUM 100 MG PO CAPS
100.0000 mg | ORAL_CAPSULE | Freq: Two times a day (BID) | ORAL | Status: DC
  Administered 2013-05-22: 100 mg via ORAL
  Filled 2013-05-22: qty 1

## 2013-05-22 MED ORDER — LEVOFLOXACIN 500 MG PO TABS
500.0000 mg | ORAL_TABLET | Freq: Every day | ORAL | Status: DC
  Administered 2013-05-22: 500 mg via ORAL
  Filled 2013-05-22: qty 1

## 2013-05-22 MED ORDER — PREDNISONE 20 MG PO TABS: 60.0000 mg | ORAL_TABLET | Freq: Every day | ORAL | Status: DC

## 2013-05-22 MED ORDER — DSS 100 MG PO CAPS: 100.0000 mg | ORAL_CAPSULE | Freq: Two times a day (BID) | ORAL | Status: DC

## 2013-05-22 NOTE — Care Management Note (Signed)
    Page 1 of 1   05/22/2013     2:29:44 PM   CARE MANAGEMENT NOTE 05/22/2013  Patient:  Olivia Randall, Olivia Randall   Account Number:  [id number]  Date Initiated:  05/22/2013  Documentation initiated by:  Rosemary Holms  Subjective/Objective Assessment:   Pt lives at home with spouse. Declines HH RN stating her spouse assists her. States her CPAP is broken, Battery on her motorized WC is dead and she wants to change O2 vendors.     Action/Plan:   Emma with [facility] spoke with pt/spouse. She will obtain the sleep study from previous CPAP qualification and supply pt with a new one. Pt instructed to go back to original supplier of [facility] for a new battery. [facility] is current O2 supplier and has...   Anticipated DC Date:  05/22/2013   Anticipated DC Plan:        DC Planning Services  CM consult      Choice offered to / List presented to:             Status of service:  Completed, signed off Medicare Important Message given?   (If response is "NO", the following Medicare IM given date fields will be blank) Date Medicare IM given:   Date Additional Medicare IM given:    Discharge Disposition:  HOME/SELF CARE  Per UR Regulation:    If discussed at Long Length of Stay Meetings, dates discussed:    Comments:  05/22/13 Rosemary Holms RN BSN CM cont...since 06/27/12 per [facility]. Emma with [facility] working with pt to tranfer service from [place] to [facility]. [facility] to deliver O2 canister for DC home.

## 2013-05-22 NOTE — Progress Notes (Signed)
Patient seen and examined. Agree with management.

## 2013-05-22 NOTE — Progress Notes (Signed)
[facility] PROGRESS NOTE  Olivia Randall ZOX:[id number] DOB: [birth year] DOA: 05/20/2013 PCP: Remus Loffler, PA-C  Assessment/Plan: COPD with acute exacerbation: Much improved this am.  Will continue nebs scheduled and prn. Will transition steroids to po. Continue Levaquin day #3. sats >90% on 2 L. Will ambulate and document sats.  Monitor closely   Acute on chronic respiratory failure: see #1.   Hypokalemia: likely related to daily lasix. Resolved this am. Will monitor.   Chronic back pain: at baseline. Continue home med.   GERD (gastroesophageal reflux disease): at baseline will continue home meds.   Dizzy: likely related to #1. Will check orthostatic vital signs.   OSA (obstructive sleep apnea): cpap provided. Tolerated well last night.    Tobacco use disorder: counseled to stop   Code Status: full Family Communication: husband at bedside Disposition Plan: home likely tomorrow   Consultants:  none  Procedures:  none  Antibiotics:  Levaquin 05/20/13>>>  HPI/Subjective: Sitting up eating breakfast. Smiling. Reports feeling much better this am.  Objective: Filed Vitals:   05/22/13 0614  BP: 147/84  Pulse: 87  Temp: 98.3 F (36.8 C)  Resp: 19    Intake/Output Summary (Last 24 hours) at 05/22/13 0903 Last data filed at 05/22/13 0600  Gross per 24 hour  Intake 1732.75 ml  Output      0 ml  Net 1732.75 ml   Filed Weights   05/20/13 2219 05/21/13 0727 05/22/13 0500  Weight: 69.3 kg (152 lb 12.5 oz) 70.3 kg (154 lb 15.7 oz) 70.5 kg (155 lb 6.8 oz)    Exam:   General: well nourished NAD  Cardiovascular: RRR no MGR No LE edema  Respiratory: less increased work of breathing, air flow remains diminished but somewhat improved. Continues with diffuse wheezes  Abdomen: soft +BS non-tender to palpation  Musculoskeletal: no clubbing no cyanosis   Data Reviewed: Basic Metabolic Panel:  Recent Labs Lab 05/20/13 1709 05/21/13 0538 05/22/13 0505   NA 138 143 141  K 3.4* 4.2 4.4  CL 97 103 103  CO2 34* 34* 32  GLUCOSE 124* 156* 140*  BUN 6 8 13   CREATININE 0.74 0.70 0.78  CALCIUM 10.0 9.9 9.8   Liver Function Tests: No results found for this basename: AST, ALT, ALKPHOS, BILITOT, PROT, ALBUMIN,  in the last 168 hours No results found for this basename: LIPASE, AMYLASE,  in the last 168 hours No results found for this basename: AMMONIA,  in the last 168 hours CBC:  Recent Labs Lab 05/20/13 1709 05/21/13 0538  WBC 11.4* 11.5*  NEUTROABS 10.0*  --   HGB 12.0 11.3*  HCT 37.5 36.4  MCV 88.2 89.4  PLT 210 203   Cardiac Enzymes:  Recent Labs Lab 05/20/13 1709  TROPONINI <0.30   BNP (last 3 results)  Recent Labs  05/20/13 1709  PROBNP 165.1*   CBG: No results found for this basename: GLUCAP,  in the last 168 hours  No results found for this or any previous visit (from the past 240 hour(s)).   Studies: Dg Chest Portable 1 View  05/20/2013   *RADIOLOGY REPORT*  Clinical Data: Shortness of breath  PORTABLE CHEST - 1 VIEW  Comparison: 12/03/2012  Findings: Heart size is normal.  There are small pleural effusions identified.  Mild interstitial edema is present.  No airspace consolidation.  There is flattening of the diaphragms and coarsened interstitial changes of COPD.  IMPRESSION:  1.  Findings compatible with CHF.   Original Report Authenticated  By: Signa Kell, M.D.    Scheduled Meds: . albuterol  2.5 mg Nebulization Q4H WA  . aspirin  325 mg Oral q morning - 10a  . clopidogrel  75 mg Oral QHS  . donepezil  5 mg Oral q morning - 10a  . enoxaparin (LOVENOX) injection  40 mg Subcutaneous Q24H  . furosemide  20 mg Oral Daily  . guaiFENesin  1,200 mg Oral BID  . ipratropium  0.5 mg Nebulization Q4H WA  . levofloxacin (LEVAQUIN) IV  500 mg Intravenous Q24H  . lisinopril  10 mg Oral Daily  . loratadine  10 mg Oral q morning - 10a  . meloxicam  7.5 mg Oral q morning - 10a  . methylPREDNISolone (SOLU-MEDROL)  injection  125 mg Intravenous Q6H  . nicotine  21 mg Transdermal Daily  . pantoprazole  40 mg Oral BID  . PARoxetine  40 mg Oral Daily   Continuous Infusions: . 0.9 % NaCl with KCl 20 mEq / L 20 mL/hr at 05/21/13 2049    Principal Problem:   COPD with acute exacerbation Active Problems:   Chronic respiratory failure   Hypokalemia   Chronic back pain   GERD (gastroesophageal reflux disease)   Dizzy   OSA (obstructive sleep apnea)   Tobacco use disorder    Time spent: 35 minutes    [facility]  [facility] Pager [phone]. If 7PM-7AM, please contact night-coverage at [email or web address], password [facility] 05/22/2013, 9:03 AM  LOS: 2 days

## 2013-05-22 NOTE — Plan of Care (Signed)
Problem: Phase I Progression Outcomes Goal: O2 sats > or equal 90% or at baseline Outcome: Completed/Met Date Met:  05/22/13 05/22/13 1624 Patient has home O2 at 2 lpm. [facility] notified per case management of need for O2 tank for discharge home. O2 delivered for home use prior to discharge. Earnstine Regal, RN  Problem: Discharge Progression Outcomes Goal: Flu vaccine received if indicated Outcome: Not Applicable Date Met:  05/22/13 05/22/13 1626 Patient declined flu vaccine. Earnstine Regal ,RN

## 2013-05-22 NOTE — Discharge Summary (Signed)
Physician Discharge Summary  Olivia Randall [id number] DOB: 1950-11-03 DOA: 05/20/2013  PCP: Remus Loffler, PA-C  Admit date: 05/20/2013 Discharge date: 05/22/2013  Time spent: 40 minutes  Recommendations for Outpatient Follow-up:  1. Follow up with PCP in 1 week for evaluation of symptoms  Discharge Diagnoses:  Principal Problem:   COPD with acute exacerbation Active Problems:   Chronic respiratory failure   Hypokalemia   Chronic back pain   GERD (gastroesophageal reflux disease)   Dizzy   OSA (obstructive sleep apnea)   Tobacco use disorder   Discharge Condition: stable  Diet recommendation: heart healthy  Filed Weights   05/20/13 2219 05/21/13 0727 05/22/13 0500  Weight: 69.3 kg (152 lb 12.5 oz) 70.3 kg (154 lb 15.7 oz) 70.5 kg (155 lb 6.8 oz)    History of present illness:  Olivia Randall is a 62 y.o. female. Caucasian lady with a history of COPD, continues to smoke half pack a day, had persistent shortness of breath and cough productive of green sputum since for 2 days despite continued use of her bronchodilators. Eventually she came to the emergency on 05/20/13 but did not improve sufficiently with initial treatment on the hospitalist service was called. She reported fever chills and upper respiratory symptoms, but no sick contacts. She reported progressive swelling of the lower extremities since for the past 2 weeks. She has sleep apnea and is prescribed CPAP but the machine is broken so she hasn't been using. Chest xray consistent with CHF per report.    Hospital Course:  COPD with acute exacerbation: admitted to hospital. Provided with nebs, solumedrol and levaquin. She slowly improved. At discharge her work of breathing much improved. Able to complete sentences and no use of accessory muscles. sats >90% on 2L. Will discharge with prednisone taper and 7 more days of levaquin.    Acute on chronic respiratory failure: see #1.   Hypokalemia: likely related to daily lasix.  Repleted and resolved. Recommend OP follow up 1 week to track potassium level.   Chronic back pain:  Remained at baseline.    GERD (gastroesophageal reflux disease): remained at baseline.   Dizzy: likely related to #1. No orthostasis   OSA (obstructive sleep apnea): continue cpap   Tobacco use disorder: counseled to stop. Provided with patch   Procedures:  none  Consultations:  none  Discharge Exam: Filed Vitals:   05/22/13 0614  BP: 147/84  Pulse: 87  Temp: 98.3 F (36.8 C)  Resp: 19    General: well nourished NAD Cardiovascular: RRR No MGR No LE edema Respiratory: normal effort BS somewhat diminished with fair air flow. Diffuse wheeze no crackles  Discharge Instructions     Medication List         ALPRAZolam 0.5 MG tablet  Commonly known as:  XANAX  Take 0.5 mg by mouth 3 (three) times daily as needed for sleep or anxiety.     ANTI-DIARRHEAL 2 MG capsule  Generic drug:  loperamide  Take 2 mg by mouth as needed for diarrhea or loose stools.     aspirin 325 MG tablet  Take 325 mg by mouth every morning.     B-12 50 MCG Tabs  Take 1 tablet by mouth daily.     clopidogrel 75 MG tablet  Commonly known as:  PLAVIX  Take 75 mg by mouth at bedtime.     cyclobenzaprine 10 MG tablet  Commonly known as:  FLEXERIL  Take 10 mg by mouth 3 (three) times  daily as needed for muscle spasms.     donepezil 5 MG tablet  Commonly known as:  ARICEPT  Take 5 mg by mouth every morning.     DSS 100 MG Caps  Take 100 mg by mouth 2 (two) times daily.     furosemide 20 MG tablet  Commonly known as:  LASIX  Take 20 mg by mouth daily.     GREEN TEA PO  Take by mouth daily.     guaiFENesin 600 MG 12 hr tablet  Commonly known as:  MUCINEX  Take 2 tablets (1,200 mg total) by mouth 2 (two) times daily.     HYDROcodone-acetaminophen 10-325 MG per tablet  Commonly known as:  NORCO  Take 1 tablet by mouth 3 (three) times daily.     hydrOXYzine 25 MG tablet  Commonly  known as:  ATARAX/VISTARIL  Take 25 mg by mouth 2 (two) times daily.     ipratropium 0.02 % nebulizer solution  Commonly known as:  ATROVENT  Take 500 mcg by nebulization every 6 (six) hours as needed for wheezing (*To be mixed with Albuterol for treatments via nebulization*).     levofloxacin 500 MG tablet  Commonly known as:  LEVAQUIN  Take 1 tablet (500 mg total) by mouth daily.     lisinopril 10 MG tablet  Commonly known as:  PRINIVIL,ZESTRIL  Take 10 mg by mouth daily.     loratadine 10 MG tablet  Commonly known as:  CLARITIN  Take 10 mg by mouth every morning.     meloxicam 7.5 MG tablet  Commonly known as:  MOBIC  Take 7.5 mg by mouth every morning.     nicotine 21 mg/24hr patch  Commonly known as:  NICODERM CQ - dosed in mg/24 hours  Place 1 patch onto the skin daily.     omeprazole 20 MG capsule  Commonly known as:  PRILOSEC  Take 20 mg by mouth 2 (two) times daily.     oxyCODONE 5 MG immediate release tablet  Commonly known as:  Oxy IR/ROXICODONE  Take 1 tablet (5 mg total) by mouth every 6 (six) hours as needed.     PARoxetine 40 MG tablet  Commonly known as:  PAXIL  Take 40 mg by mouth every morning.     predniSONE 10 MG tablet  Commonly known as:  DELTASONE  Take 4 tabs 9/10, 9/11 and 9/12, take 2 tabs 9/13, 9/14 and 9/15, take 1 tab 9/16, 9/17 and 9/18 then stop.     PROAIR HFA 108 (90 BASE) MCG/ACT inhaler  Generic drug:  albuterol  Inhale 2 puffs into the lungs every 6 (six) hours as needed for shortness of breath.     albuterol (2.5 MG/3ML) 0.083% nebulizer solution  Commonly known as:  PROVENTIL  Take 2.5 mg by nebulization every 6 (six) hours as needed for shortness of breath (Mixed with Ipratropium).       No Known Allergies     Follow-up Information   Follow up with Remus Loffler, PA-C. Schedule an appointment as soon as possible for a visit in 1 week. (evaluate symptoms)    Specialty:  General Practice   Contact information:   [address] [place] [address] [phone]        The results of significant diagnostics from this hospitalization (including imaging, microbiology, ancillary and laboratory) are listed below for reference.    Significant Diagnostic Studies: Dg Chest Portable 1 View  05/20/2013   *RADIOLOGY REPORT*  Clinical Data: Shortness of breath  PORTABLE CHEST - 1 VIEW  Comparison: 12/03/2012  Findings: Heart size is normal.  There are small pleural effusions identified.  Mild interstitial edema is present.  No airspace consolidation.  There is flattening of the diaphragms and coarsened interstitial changes of COPD.  IMPRESSION:  1.  Findings compatible with CHF.   Original Report Authenticated By: Signa Kell, M.D.    Microbiology: No results found for this or any previous visit (from the past 240 hour(s)).   Labs: Basic Metabolic Panel:  Recent Labs Lab 05/20/13 1709 05/21/13 0538 05/22/13 0505  NA 138 143 141  K 3.4* 4.2 4.4  CL 97 103 103  CO2 34* 34* 32  GLUCOSE 124* 156* 140*  BUN 6 8 13   CREATININE 0.74 0.70 0.78  CALCIUM 10.0 9.9 9.8   Liver Function Tests: No results found for this basename: AST, ALT, ALKPHOS, BILITOT, PROT, ALBUMIN,  in the last 168 hours No results found for this basename: LIPASE, AMYLASE,  in the last 168 hours No results found for this basename: AMMONIA,  in the last 168 hours CBC:  Recent Labs Lab 05/20/13 1709 05/21/13 0538  WBC 11.4* 11.5*  NEUTROABS 10.0*  --   HGB 12.0 11.3*  HCT 37.5 36.4  MCV 88.2 89.4  PLT 210 203   Cardiac Enzymes:  Recent Labs Lab 05/20/13 1709  TROPONINI <0.30   BNP: BNP (last 3 results)  Recent Labs  05/20/13 1709  PROBNP 165.1*   CBG: No results found for this basename: GLUCAP,  in the last 168 hours     Signed:  Gwenyth Bender  [facility] 05/22/2013, 11:56 AM Attending: Patient seen and examined. She is medically stable for discharge. She will have close followup with her primary  care physician. She will need tapering course of steroids and antibiotics.

## 2013-12-02 ENCOUNTER — Encounter (HOSPITAL_COMMUNITY): Payer: Self-pay | Admitting: Emergency Medicine

## 2013-12-02 ENCOUNTER — Emergency Department (HOSPITAL_COMMUNITY)
Admission: EM | Admit: 2013-12-02 | Discharge: 2013-12-02 | Disposition: A | Discharge status: Home / Self Care | Payer: Medicare HMO | Attending: Emergency Medicine | Admitting: Emergency Medicine

## 2013-12-02 ENCOUNTER — Emergency Department (HOSPITAL_COMMUNITY): Payer: Medicare HMO

## 2013-12-02 DIAGNOSIS — J209 Acute bronchitis, unspecified: Secondary | ICD-10-CM | POA: Insufficient documentation

## 2013-12-02 DIAGNOSIS — E8729 Other acidosis: Secondary | ICD-10-CM

## 2013-12-02 DIAGNOSIS — J449 Chronic obstructive pulmonary disease, unspecified: Secondary | ICD-10-CM

## 2013-12-02 DIAGNOSIS — F411 Generalized anxiety disorder: Secondary | ICD-10-CM | POA: Insufficient documentation

## 2013-12-02 DIAGNOSIS — F329 Major depressive disorder, single episode, unspecified: Secondary | ICD-10-CM | POA: Insufficient documentation

## 2013-12-02 DIAGNOSIS — E872 Acidosis: Secondary | ICD-10-CM

## 2013-12-02 DIAGNOSIS — Z792 Long term (current) use of antibiotics: Secondary | ICD-10-CM | POA: Insufficient documentation

## 2013-12-02 DIAGNOSIS — Z7902 Long term (current) use of antithrombotics/antiplatelets: Secondary | ICD-10-CM | POA: Insufficient documentation

## 2013-12-02 DIAGNOSIS — R5383 Other fatigue: Secondary | ICD-10-CM

## 2013-12-02 DIAGNOSIS — G8929 Other chronic pain: Secondary | ICD-10-CM | POA: Insufficient documentation

## 2013-12-02 DIAGNOSIS — F3289 Other specified depressive episodes: Secondary | ICD-10-CM | POA: Insufficient documentation

## 2013-12-02 DIAGNOSIS — Z9981 Dependence on supplemental oxygen: Secondary | ICD-10-CM | POA: Insufficient documentation

## 2013-12-02 DIAGNOSIS — I10 Essential (primary) hypertension: Secondary | ICD-10-CM | POA: Insufficient documentation

## 2013-12-02 DIAGNOSIS — R5381 Other malaise: Secondary | ICD-10-CM | POA: Insufficient documentation

## 2013-12-02 DIAGNOSIS — J440 Chronic obstructive pulmonary disease with acute lower respiratory infection: Secondary | ICD-10-CM | POA: Insufficient documentation

## 2013-12-02 DIAGNOSIS — F17200 Nicotine dependence, unspecified, uncomplicated: Secondary | ICD-10-CM | POA: Insufficient documentation

## 2013-12-02 DIAGNOSIS — IMO0002 Reserved for concepts with insufficient information to code with codable children: Secondary | ICD-10-CM | POA: Insufficient documentation

## 2013-12-02 DIAGNOSIS — K219 Gastro-esophageal reflux disease without esophagitis: Secondary | ICD-10-CM | POA: Insufficient documentation

## 2013-12-02 DIAGNOSIS — Z79899 Other long term (current) drug therapy: Secondary | ICD-10-CM | POA: Insufficient documentation

## 2013-12-02 LAB — CBC WITH DIFFERENTIAL/PLATELET
BASOS ABS: 0 10*3/uL (ref 0.0–0.1)
Basophils Relative: 0 % (ref 0–1)
Eosinophils Absolute: 0.3 10*3/uL (ref 0.0–0.7)
Eosinophils Relative: 6 % — ABNORMAL HIGH (ref 0–5)
HEMATOCRIT: 38.5 % (ref 36.0–46.0)
Hemoglobin: 11.7 g/dL — ABNORMAL LOW (ref 12.0–15.0)
LYMPHS PCT: 24 % (ref 12–46)
Lymphs Abs: 1.4 10*3/uL (ref 0.7–4.0)
MCH: 26.9 pg (ref 26.0–34.0)
MCHC: 30.4 g/dL (ref 30.0–36.0)
MCV: 88.5 fL (ref 78.0–100.0)
Monocytes Absolute: 0.5 10*3/uL (ref 0.1–1.0)
Monocytes Relative: 8 % (ref 3–12)
NEUTROS ABS: 3.6 10*3/uL (ref 1.7–7.7)
Neutrophils Relative %: 62 % (ref 43–77)
PLATELETS: 173 10*3/uL (ref 150–400)
RBC: 4.35 MIL/uL (ref 3.87–5.11)
RDW: 14.3 % (ref 11.5–15.5)
WBC: 5.8 10*3/uL (ref 4.0–10.5)

## 2013-12-02 LAB — BLOOD GAS, ARTERIAL
Acid-Base Excess: 11.6 mmol/L — ABNORMAL HIGH (ref 0.0–2.0)
Bicarbonate: 36.8 mEq/L — ABNORMAL HIGH (ref 20.0–24.0)
O2 CONTENT: 2 L/min
O2 Saturation: 91.9 %
PCO2 ART: 59.5 mmHg — AB (ref 35.0–45.0)
PH ART: 7.407 (ref 7.350–7.450)
Patient temperature: 37
TCO2: 33.3 mmol/L (ref 0–100)
pO2, Arterial: 67.4 mmHg — ABNORMAL LOW (ref 80.0–100.0)

## 2013-12-02 LAB — BASIC METABOLIC PANEL
BUN: 11 mg/dL (ref 6–23)
CHLORIDE: 98 meq/L (ref 96–112)
CO2: 41 meq/L — AB (ref 19–32)
Calcium: 9.6 mg/dL (ref 8.4–10.5)
Creatinine, Ser: 0.75 mg/dL (ref 0.50–1.10)
GFR calc Af Amer: >90 mL/min (ref >90)
GFR calc non Af Amer: 88 mL/min — ABNORMAL LOW (ref >90)
Glucose, Bld: 116 mg/dL — ABNORMAL HIGH (ref 70–99)
Potassium: 4.1 mEq/L (ref 3.7–5.3)
SODIUM: 141 meq/L (ref 137–147)

## 2013-12-02 LAB — TROPONIN I

## 2013-12-02 LAB — TSH: TSH: 0.237 u[IU]/mL — AB (ref 0.350–4.500)

## 2013-12-02 MED ORDER — IPRATROPIUM-ALBUTEROL 0.5-2.5 (3) MG/3ML IN SOLN
3.0000 mL | Freq: Once | RESPIRATORY_TRACT | Status: AC
  Administered 2013-12-02: 3 mL via RESPIRATORY_TRACT
  Filled 2013-12-02: qty 3

## 2013-12-02 MED ORDER — PREDNISONE 20 MG PO TABS
60.0000 mg | ORAL_TABLET | Freq: Every day | ORAL | Status: DC
Start: 2013-12-02 — End: 2014-06-26

## 2013-12-02 MED ORDER — LEVOFLOXACIN 500 MG PO TABS: 500.0000 mg | ORAL_TABLET | Freq: Every day | ORAL | Status: DC

## 2013-12-02 MED ORDER — PREDNISONE 50 MG PO TABS
60.0000 mg | ORAL_TABLET | Freq: Once | ORAL | Status: AC
  Administered 2013-12-02: 60 mg via ORAL
  Filled 2013-12-02 (×2): qty 1

## 2013-12-02 MED ORDER — ASPIRIN 81 MG PO CHEW
324.0000 mg | CHEWABLE_TABLET | Freq: Once | ORAL | Status: AC
  Administered 2013-12-02: 324 mg via ORAL
  Filled 2013-12-02: qty 4

## 2013-12-02 NOTE — Discharge Instructions (Signed)
Chronic Obstructive Pulmonary Disease  Chronic obstructive pulmonary disease (COPD) is a common lung condition in which airflow from the lungs is limited. COPD is a general term that can be used to describe many different lung problems that limit airflow, including both chronic bronchitis and emphysema.  If you have COPD, your lung function will probably never return to normal, but there are measures you can take to improve lung function and make yourself feel better.   CAUSES   · Smoking (common).    · Exposure to secondhand smoke.    · Genetic problems.  · Chronic inflammatory lung diseases or recurrent infections.  SYMPTOMS   · Shortness of breath, especially with physical activity.    · Deep, persistent (chronic) cough with a large amount of thick mucus.    · Wheezing.    · Rapid breaths (tachypnea).    · Gray or bluish discoloration (cyanosis) of the skin, especially in fingers, toes, or lips.    · Fatigue.    · Weight loss.    · Frequent infections or episodes when breathing symptoms become much worse (exacerbations).    · Chest tightness.  DIAGNOSIS   Your healthcare provider will take a medical history and perform a physical examination to make the initial diagnosis.  Additional tests for COPD may include:   · Lung (pulmonary) function tests.  · Chest X-ray.  · CT scan.  · Blood tests.  TREATMENT   Treatment available to help you feel better when you have COPD include:   · Inhaler and nebulizer medicines. These help manage the symptoms of COPD and make your breathing more comfortable  · Supplemental oxygen. Supplemental oxygen is only helpful if you have a low oxygen level in your blood.    · Exercise and physical activity. These are beneficial for nearly all people with COPD. Some people may also benefit from a pulmonary rehabilitation program.  HOME CARE INSTRUCTIONS   · Take all medicines (inhaled or pills) as directed by your health care provider.  · Only take over-the-counter or prescription medicines  for pain, fever, or discomfort as directed by your health care provider.    · Avoid over-the-counter medicines or cough syrups that dry up your airway (such as antihistamines) and slow down the elimination of secretions unless instructed otherwise by your healthcare provider.    · If you are a smoker, the most important thing that you can do is stop smoking. Continuing to smoke will cause further lung damage and breathing trouble. Ask your health care provider for help with quitting smoking. He or she can direct you to community resources or hospitals that provide support.  · Avoid exposure to irritants such as smoke, chemicals, and fumes that aggravate your breathing.  · Use oxygen therapy and pulmonary rehabilitation if directed by your health care provider. If you require home oxygen therapy, ask your healthcare provider whether you should purchase a pulse oximeter to measure your oxygen level at home.    · Avoid contact with individuals who have a contagious illness.  · Avoid extreme temperature and humidity changes.  · Eat healthy foods. Eating smaller, more frequent meals and resting before meals may help you maintain your strength.  · Stay active, but balance activity with periods of rest. Exercise and physical activity will help you maintain your ability to do things you want to do.  · Preventing infection and hospitalization is very important when you have COPD. Make sure to receive all the vaccines your health care provider recommends, especially the pneumococcal and influenza vaccines. Ask your healthcare provider whether you   need a pneumonia vaccine.  · Learn and use relaxation techniques to manage stress.  · Learn and use controlled breathing techniques as directed by your health care provider. Controlled breathing techniques include:    · Pursed lip breathing. Start by breathing in (inhaling) through your nose for 1 second. Then, purse your lips as if you were going to whistle and breathe out (exhale)  through the pursed lips for 2 seconds.    · Diaphragmatic breathing. Start by putting one hand on your abdomen just above your waist. Inhale slowly through your nose. The hand on your abdomen should move out. Then purse your lips and exhale slowly. You should be able to feel the hand on your abdomen moving in as you exhale.    · Learn and use controlled coughing to clear mucus from your lungs. Controlled coughing is a series of short, progressive coughs. The steps of controlled coughing are:    1. Lean your head slightly forward.    2. Breathe in deeply using diaphragmatic breathing.    3. Try to hold your breath for 3 seconds.    4. Keep your mouth slightly open while coughing twice.    5. Spit any mucus out into a tissue.    6. Rest and repeat the steps once or twice as needed.  SEEK MEDICAL CARE IF:   · You are coughing up more mucus than usual.    · There is a change in the color or thickness of your mucus.    · Your breathing is more labored than usual.    · Your breathing is faster than usual.    SEEK IMMEDIATE MEDICAL CARE IF:   · You have shortness of breath while you are resting.    · You have shortness of breath that prevents you from:  · Being able to talk.    · Performing your usual physical activities.    · You have chest pain lasting longer than 5 minutes.    · Your skin color is more cyanotic than usual.  · You measure low oxygen saturations for longer than 5 minutes with a pulse oximeter.  MAKE SURE YOU:   · Understand these instructions.  · Will watch your condition.  · Will get help right away if you are not doing well or get worse.  Document Released: 06/09/2005 Document Revised: 06/20/2013 Document Reviewed: 04/26/2013  [employer]® Patient Information ©2014 [employer].

## 2013-12-02 NOTE — ED Notes (Signed)
resp therapy paged for breathing tx,

## 2013-12-02 NOTE — ED Notes (Signed)
Pt c/o sob and nonproductive cough that started a week ago, requires home oxygen but feels that the sob has become worse, Dr Dina Rich in prior to RN, see edp assessment for further,

## 2013-12-02 NOTE — ED Notes (Signed)
CRITICAL VALUE ALERT  Critical value received:  CO2 41  Date of notification:  12/02/13  Time of notification:  1708  Critical value read back:yes  Nurse who received alert:  GM  MD notified (1st page):  [place]  Time of first page:  1708  MD notified (2nd page):  Time of second page:  Responding MD:  [facility]  Time MD responded:  [phone]

## 2013-12-02 NOTE — ED Provider Notes (Signed)
CSN: [id number]     Arrival date & time 12/02/13  1515 History  This chart was scribed for Merryl Hacker, MD by Marcha Dutton, ED Scribe. This patient was seen in room APA19/APA19 and the patient's care was started at 3:44 PM.    Chief Complaint  Patient presents with  . Shortness of Breath      HPI HPI Comments: Olivia Randall is a 63 y.o. female  With a h/o HTN and COPD who presents to the Emergency Department complaining of SOB that began a week ago. Pt states she has been tired and sleeping most of the time feeling generally weak. She also reports associated cough and intermittent chest tightness that has worsened over the past 3 hours prompting her to come to the ED. Pt confirms she is ambulatory and eating. She denies weakness on a single side. Pt states she has had a 20 or so pound weight gain over the past 3 months. Pt denies h/o CAD, or MIs. She also states she saw a Doctor on the 5th of March who gave her a "shot" for her breathing.       H/o gout Past Medical History  Diagnosis Date  . COPD (chronic obstructive pulmonary disease)   . Hypertension   . Sleep apnea   . Depression   . On home O2     2L N/C  . Chronic back pain   . Anxiety and depression   . GERD (gastroesophageal reflux disease)   . Chronic neck pain     Past Surgical History  Procedure Laterality Date  . Abdominal hysterectomy    . Bladder surgery    . Breast surgery    . Hemorrhoid surgery    . Carpal tunnel release    . Neck surgery    . Shoulder surgery Right     Family History  Problem Relation Age of Onset  . Diabetes Mother   . Diabetes Father   . Diabetes Brother     History  Substance Use Topics  . Smoking status: Current Every Day Smoker    Types: Cigarettes  . Smokeless tobacco: Not on file  . Alcohol Use: No    OB History   Grav Para Term Preterm Abortions TAB SAB Ect Mult Living                  Review of Systems  Constitutional: Positive for fatigue.  Negative for fever and chills.  Respiratory: Positive for cough, chest tightness and shortness of breath.   Cardiovascular: Negative for chest pain and leg swelling.  Gastrointestinal: Negative for nausea, vomiting and abdominal pain.  Genitourinary: Negative for dysuria.  Musculoskeletal: Negative for back pain.  Skin: Negative for rash.  Neurological: Negative for headaches.  Psychiatric/Behavioral: Negative for confusion.  All other systems reviewed and are negative.      Allergies  Review of patient's allergies indicates no known allergies.  Home Medications   Current Outpatient Rx  Name  Route  Sig  Dispense  Refill  . albuterol (PROAIR HFA) 108 (90 BASE) MCG/ACT inhaler   Inhalation   Inhale 2 puffs into the lungs every 6 (six) hours as needed for shortness of breath.          [name] albuterol (PROVENTIL) (2.5 MG/3ML) 0.083% nebulizer solution   Nebulization   Take 2.5 mg by nebulization every 6 (six) hours as needed for shortness of breath (Mixed with Ipratropium).          [name]  ALPRAZolam (XANAX) 1 MG tablet   Oral   Take 1 mg by mouth 2 (two) times daily.         . clopidogrel (PLAVIX) 75 MG tablet   Oral   Take 75 mg by mouth at bedtime.         . cyclobenzaprine (FLEXERIL) 10 MG tablet   Oral   Take 10 mg by mouth 3 (three) times daily as needed for muscle spasms.          [name] donepezil (ARICEPT) 5 MG tablet   Oral   Take 5 mg by mouth every morning.         . furosemide (LASIX) 20 MG tablet   Oral   Take 20 mg by mouth every morning.          [name] guaiFENesin (MUCINEX) 600 MG 12 hr tablet   Oral   Take 600 mg by mouth 2 (two) times daily.         . hydrOXYzine (VISTARIL) 25 MG capsule   Oral   Take 25 mg by mouth 2 (two) times daily.         [name] ipratropium (ATROVENT) 0.02 % nebulizer solution   Nebulization   Take 500 mcg by nebulization every 6 (six) hours as needed for wheezing (*To be mixed with Albuterol for treatments via  nebulization*).         [name] lisinopril (PRINIVIL,ZESTRIL) 10 MG tablet   Oral   Take 10 mg by mouth daily.         [name] loratadine (CLARITIN) 10 MG tablet   Oral   Take 10 mg by mouth every morning.          . meloxicam (MOBIC) 7.5 MG tablet   Oral   Take 7.5 mg by mouth every morning.         [name] omeprazole (PRILOSEC) 20 MG capsule   Oral   Take 20 mg by mouth 2 (two) times daily.          [name] oxyCODONE (OXY IR/ROXICODONE) 5 MG immediate release tablet   Oral   Take 1 tablet (5 mg total) by mouth every 6 (six) hours as needed.   12 tablet   0   . PARoxetine (PAXIL) 40 MG tablet   Oral   Take 40 mg by mouth every morning.         [name] levofloxacin (LEVAQUIN) 500 MG tablet   Oral   Take 1 tablet (500 mg total) by mouth daily.   7 tablet   0   . predniSONE (DELTASONE) 20 MG tablet   Oral   Take 3 tablets (60 mg total) by mouth daily with breakfast.   12 tablet   0    Triage Vitals: BP 125/68  Pulse 88  Temp(Src) 98 F (36.7 C) (Oral)  Resp 28  Ht 5\' 6"  (1.676 m)  Wt 168 lb (76.204 kg)  BMI 27.13 kg/m2  SpO2 92%  Physical Exam  Nursing note and vitals reviewed. Constitutional: She is oriented to person, place, and time. No distress.  Elderly, chronically ill-appearing, nontoxic  HENT:  Head: Normocephalic and atraumatic.  Mouth/Throat: Oropharynx is clear and moist.  Eyes: Pupils are equal, round, and reactive to light.  Neck: Neck supple.  Cardiovascular: Normal rate, regular rhythm and normal heart sounds.   No murmur heard. Pulmonary/Chest: Effort normal. No respiratory distress. She has wheezes.  Diffuse expiratory wheezing, nasal cannula in place with home O2  Abdominal: Soft. Bowel  sounds are normal. There is no tenderness. There is no rebound.  Musculoskeletal: She exhibits no edema.  Neurological: She is alert and oriented to person, place, and time.  Skin: Skin is warm and dry.  Psychiatric: She has a normal mood and affect.    ED Course   Procedures (including critical care time) DIAGNOSTIC STUDIES: Oxygen Saturation is 92% on [place], low by my interpretation.    COORDINATION OF CARE: 3:49 PM- Pt advised of plan for treatment and pt agrees.    Labs Review Labs Reviewed  CBC WITH DIFFERENTIAL - Abnormal; Notable for the following:    Hemoglobin 11.7 (*)    Eosinophils Relative 6 (*)    All other components within normal limits  BASIC METABOLIC PANEL - Abnormal; Notable for the following:    CO2 41 (*)    Glucose, Bld 116 (*)    GFR calc non Af Amer 88 (*)    All other components within normal limits  TSH - Abnormal; Notable for the following:    TSH 0.237 (*)    All other components within normal limits  BLOOD GAS, ARTERIAL - Abnormal; Notable for the following:    pCO2 arterial 59.5 (*)    pO2, Arterial 67.4 (*)    Bicarbonate 36.8 (*)    Acid-Base Excess 11.6 (*)    All other components within normal limits  TROPONIN I   Imaging Review Dg Chest 2 View  12/02/2013   CLINICAL DATA:  Shortness of breath, chest pain, history COPD, hypertension  EXAM: CHEST  2 VIEW  COMPARISON:  05/20/2013  FINDINGS: Normal heart size and pulmonary vascularity.  Calcified tortuous thoracic aorta.  Emphysematous and bronchitic changes consistent with COPD.  Subsegmental atelectasis at right base medially.  Lungs otherwise clear.  No pleural effusion or pneumothorax.  Prior cervical spine fusion.  Chronic compression deformity of a mid thoracic vertebra, stable.  IMPRESSION: COPD changes.  Subsegmental atelectasis at medial right lung base.   Electronically Signed   By: Lavonia Dana M.D.   On: 12/02/2013 16:57     EKG Interpretation None      EKG independently reviewed by myself: Normal sinus rhythm with a rate of 87, no evidence of ST elevation or acute ischemia, no evidence of arrhythmia  MDM   Final diagnoses:  COPD (chronic obstructive pulmonary disease) with chronic bronchitis  CO2 retention    Patient presents with  fatigue and cough. Patient reports shortness of breath and intermittent chest pain that is nonexertional. She is wheezing on exam. History COPD and on chronic home O2. She is awake, alert, oriented.  Patient was given DuoNeb and prednisone. Chest x-ray shows no evidence of pneumonia.  Lab work notable for a bicarbonate of 41 on BMP. Blood class was obtained. Patient has maintained oxygen saturations of greater than 92% on her home oxygen. ABG shows respiratory acidosis with compensatory metabolic alkalosis. Patient CO2 60. This may be contributing to patient's feelings of sleepiness. She has not been excessively sleepy in the emergency room and has been awake, alert and talking the entire time. Discussed with patient admission versus discharge for COPD exacerbation. Patient reports improvement of symptoms with DuoNeb. She was able to ambulate on her home oxygen and maintained oxygen saturations. She would like to be discharged home. We'll place on Levaquin and steroids. She's to followup with her PCP. TSH pending given fatigue and weight gain.  After history, exam, and medical workup I feel the patient has been appropriately medically screened  and is safe for discharge home. Pertinent diagnoses were discussed with the patient. Patient was given return precautions.  I personally performed the services described in this documentation, which was scribed in my presence. The recorded information has been reviewed and is accurate.            Merryl Hacker, MD 12/03/13 [phone]

## 2013-12-02 NOTE — ED Notes (Signed)
Pt reports has had sob and nonproductive cough x 1 week.  Reports has sleep apnea and has laid in bed all week.   C/O pain in r rib area, says feels like a "catch."

## 2013-12-02 NOTE — ED Notes (Signed)
CRITICAL VALUE ALERT  Critical value received:  ABG Co2, 59.5  Date of notification:  12/02/2013  Time of notification:  18:03  Critical value read back: yes  Nurse who received alert:  Cruzita Lederer   MD notified (1st page):  Dr Dina Rich   Time of first page:  18:03  MD notified (2nd page):  Time of second page:  Responding MD:  Dr Dina Rich  Time MD responded:  18:05

## 2014-06-26 ENCOUNTER — Encounter (HOSPITAL_COMMUNITY): Payer: Self-pay | Admitting: Emergency Medicine

## 2014-06-26 ENCOUNTER — Emergency Department (HOSPITAL_COMMUNITY): Payer: Medicare HMO

## 2014-06-26 ENCOUNTER — Emergency Department (HOSPITAL_COMMUNITY)
Admission: EM | Admit: 2014-06-26 | Discharge: 2014-06-26 | Disposition: A | Discharge status: Home / Self Care | Payer: Medicare HMO | Attending: Emergency Medicine | Admitting: Emergency Medicine

## 2014-06-26 DIAGNOSIS — G8929 Other chronic pain: Secondary | ICD-10-CM | POA: Diagnosis not present

## 2014-06-26 DIAGNOSIS — E876 Hypokalemia: Secondary | ICD-10-CM

## 2014-06-26 DIAGNOSIS — R202 Paresthesia of skin: Secondary | ICD-10-CM | POA: Diagnosis present

## 2014-06-26 DIAGNOSIS — F329 Major depressive disorder, single episode, unspecified: Secondary | ICD-10-CM | POA: Insufficient documentation

## 2014-06-26 DIAGNOSIS — Z9981 Dependence on supplemental oxygen: Secondary | ICD-10-CM | POA: Diagnosis not present

## 2014-06-26 DIAGNOSIS — Z720 Tobacco use: Secondary | ICD-10-CM | POA: Insufficient documentation

## 2014-06-26 DIAGNOSIS — I10 Essential (primary) hypertension: Secondary | ICD-10-CM | POA: Insufficient documentation

## 2014-06-26 DIAGNOSIS — Z7901 Long term (current) use of anticoagulants: Secondary | ICD-10-CM | POA: Insufficient documentation

## 2014-06-26 DIAGNOSIS — Z79899 Other long term (current) drug therapy: Secondary | ICD-10-CM | POA: Diagnosis not present

## 2014-06-26 DIAGNOSIS — J449 Chronic obstructive pulmonary disease, unspecified: Secondary | ICD-10-CM | POA: Insufficient documentation

## 2014-06-26 DIAGNOSIS — K219 Gastro-esophageal reflux disease without esophagitis: Secondary | ICD-10-CM | POA: Diagnosis not present

## 2014-06-26 LAB — URINALYSIS, ROUTINE W REFLEX MICROSCOPIC
Bilirubin Urine: NEGATIVE
Glucose, UA: NEGATIVE mg/dL
HGB URINE DIPSTICK: NEGATIVE
Ketones, ur: NEGATIVE mg/dL
Leukocytes, UA: NEGATIVE
Nitrite: NEGATIVE
PROTEIN: NEGATIVE mg/dL
Specific Gravity, Urine: 1.01 (ref 1.005–1.030)
Urobilinogen, UA: 0.2 mg/dL (ref 0.0–1.0)
pH: 6 (ref 5.0–8.0)

## 2014-06-26 LAB — COMPREHENSIVE METABOLIC PANEL
ALK PHOS: 91 U/L (ref 39–117)
ALT: 10 U/L (ref 0–35)
AST: 11 U/L (ref 0–37)
Albumin: 2.7 g/dL — ABNORMAL LOW (ref 3.5–5.2)
Anion gap: 7 (ref 5–15)
BILIRUBIN TOTAL: 0.3 mg/dL (ref 0.3–1.2)
BUN: 4 mg/dL — ABNORMAL LOW (ref 6–23)
CO2: 42 mEq/L (ref 19–32)
Calcium: 9.3 mg/dL (ref 8.4–10.5)
Chloride: 93 mEq/L — ABNORMAL LOW (ref 96–112)
Creatinine, Ser: 0.87 mg/dL (ref 0.50–1.10)
GFR calc non Af Amer: 69 mL/min — ABNORMAL LOW (ref >90)
GFR, EST AFRICAN AMERICAN: 80 mL/min — AB (ref >90)
Glucose, Bld: 77 mg/dL (ref 70–99)
Potassium: 2.8 mEq/L — CL (ref 3.7–5.3)
SODIUM: 142 meq/L (ref 137–147)
Total Protein: 6 g/dL (ref 6.0–8.3)

## 2014-06-26 LAB — CBC WITH DIFFERENTIAL/PLATELET
Basophils Absolute: 0 10*3/uL (ref 0.0–0.1)
Basophils Relative: 1 % (ref 0–1)
Eosinophils Absolute: 0.2 10*3/uL (ref 0.0–0.7)
Eosinophils Relative: 3 % (ref 0–5)
HCT: 33.5 % — ABNORMAL LOW (ref 36.0–46.0)
Hemoglobin: 11.1 g/dL — ABNORMAL LOW (ref 12.0–15.0)
LYMPHS ABS: 1.2 10*3/uL (ref 0.7–4.0)
LYMPHS PCT: 20 % (ref 12–46)
MCH: 28.6 pg (ref 26.0–34.0)
MCHC: 33.1 g/dL (ref 30.0–36.0)
MCV: 86.3 fL (ref 78.0–100.0)
Monocytes Absolute: 1 10*3/uL (ref 0.1–1.0)
Monocytes Relative: 16 % — ABNORMAL HIGH (ref 3–12)
NEUTROS PCT: 60 % (ref 43–77)
Neutro Abs: 3.7 10*3/uL (ref 1.7–7.7)
PLATELETS: 293 10*3/uL (ref 150–400)
RBC: 3.88 MIL/uL (ref 3.87–5.11)
RDW: 15.1 % (ref 11.5–15.5)
WBC: 6.1 10*3/uL (ref 4.0–10.5)

## 2014-06-26 LAB — LIPASE, BLOOD: Lipase: 34 U/L (ref 11–59)

## 2014-06-26 MED ORDER — POTASSIUM CHLORIDE 10 MEQ/100ML IV SOLN
10.0000 meq | INTRAVENOUS | Status: AC
  Administered 2014-06-26 (×3): 10 meq via INTRAVENOUS
  Filled 2014-06-26 (×3): qty 100

## 2014-06-26 MED ORDER — POTASSIUM CHLORIDE CRYS ER 20 MEQ PO TBCR
40.0000 meq | EXTENDED_RELEASE_TABLET | Freq: Once | ORAL | Status: AC
  Administered 2014-06-26: 40 meq via ORAL
  Filled 2014-06-26: qty 2

## 2014-06-26 NOTE — ED Provider Notes (Signed)
CSN: [id number]     Arrival date & time 06/26/14  1454 History   First MD Initiated Contact with Patient 06/26/14 1505     Chief Complaint  Patient presents with  . Numbness  . Chest Pain      HPI  Patient presents with concern of numbness. Patient notes over the past days she has developed first intermittent, and persistent numbness, largely around peri-oral area. Subsequently, the patient had a left arm tingling, but no pain at bedtime, nor any other time. Patient denies lightheadedness, syncope, chest pain, nausea, vomiting, endorses generalized discomfort.  She states that she takes her medication as directed.  Past Medical History  Diagnosis Date  . COPD (chronic obstructive pulmonary disease)   . Hypertension   . Sleep apnea   . Depression   . On home O2     2L N/C  . Chronic back pain   . Anxiety and depression   . GERD (gastroesophageal reflux disease)   . Chronic neck pain    Past Surgical History  Procedure Laterality Date  . Abdominal hysterectomy    . Bladder surgery    . Breast surgery    . Hemorrhoid surgery    . Carpal tunnel release    . Neck surgery    . Shoulder surgery Right    Family History  Problem Relation Age of Onset  . Diabetes Mother   . Diabetes Father   . Diabetes Brother    History  Substance Use Topics  . Smoking status: Current Every Day Smoker    Types: Cigarettes  . Smokeless tobacco: Not on file  . Alcohol Use: No   OB History   Grav Para Term Preterm Abortions TAB SAB Ect Mult Living                 Review of Systems  Constitutional:       Per HPI, otherwise negative  HENT:       Per HPI, otherwise negative  Respiratory:       Per HPI, otherwise negative  Cardiovascular:       Per HPI, otherwise negative  Gastrointestinal: Negative for vomiting.  Endocrine:       Negative aside from HPI  Genitourinary:       Neg aside from HPI   Musculoskeletal:       Per HPI, otherwise negative  Skin: Negative.    Neurological: Positive for weakness and numbness. Negative for syncope, facial asymmetry, light-headedness and headaches.      Allergies  Review of patient's allergies indicates no known allergies.  Home Medications   Prior to Admission medications   Medication Sig Start Date End Date Taking? Authorizing Provider  albuterol (PROAIR HFA) 108 (90 BASE) MCG/ACT inhaler Inhale 2 puffs into the lungs every 6 (six) hours as needed for shortness of breath.    Yes Historical Provider, MD  albuterol (PROVENTIL) (2.5 MG/3ML) 0.083% nebulizer solution Take 2.5 mg by nebulization every 6 (six) hours as needed for shortness of breath (Mixed with Ipratropium).    Yes Historical Provider, MD  ALPRAZolam Duanne Moron) 1 MG tablet Take 1 mg by mouth 2 (two) times daily.   Yes Historical Provider, MD  ipratropium (ATROVENT) 0.02 % nebulizer solution Take 500 mcg by nebulization every 6 (six) hours as needed for wheezing (*To be mixed with Albuterol for treatments via nebulization*).   Yes Historical Provider, MD  clopidogrel (PLAVIX) 75 MG tablet Take 75 mg by mouth at bedtime.  Historical Provider, MD  cyclobenzaprine (FLEXERIL) 10 MG tablet Take 10 mg by mouth 3 (three) times daily as needed for muscle spasms.     Historical Provider, MD  donepezil (ARICEPT) 5 MG tablet Take 5 mg by mouth every morning.    Historical Provider, MD  furosemide (LASIX) 20 MG tablet Take 20 mg by mouth every morning.     Historical Provider, MD  guaiFENesin (MUCINEX) 600 MG 12 hr tablet Take 600 mg by mouth 2 (two) times daily. 05/22/13   Radene Gunning, NP  hydrOXYzine (VISTARIL) 25 MG capsule Take 25 mg by mouth 2 (two) times daily.    Historical Provider, MD  lisinopril (PRINIVIL,ZESTRIL) 10 MG tablet Take 10 mg by mouth daily.    Historical Provider, MD  loratadine (CLARITIN) 10 MG tablet Take 10 mg by mouth every morning.     Historical Provider, MD  meloxicam (MOBIC) 7.5 MG tablet Take 7.5 mg by mouth every morning.     Historical Provider, MD  omeprazole (PRILOSEC) 20 MG capsule Take 20 mg by mouth 2 (two) times daily.     Historical Provider, MD  oxyCODONE (OXY IR/ROXICODONE) 5 MG immediate release tablet Take 1 tablet (5 mg total) by mouth every 6 (six) hours as needed. 05/22/13   Radene Gunning, NP  PARoxetine (PAXIL) 40 MG tablet Take 40 mg by mouth every morning.    Historical Provider, MD   BP 116/56  Pulse 75  Temp(Src) 99.3 F (37.4 C) (Oral)  Resp 15  Ht 5\' 6"  (1.676 m)  Wt 146 lb (66.225 kg)  BMI 23.58 kg/m2  SpO2 100% Physical Exam  Nursing note and vitals reviewed. Constitutional: She is oriented to person, place, and time. She appears well-developed and well-nourished. No distress.  HENT:  Head: Normocephalic and atraumatic.  Eyes: Conjunctivae and EOM are normal.  Cardiovascular: Normal rate and regular rhythm.   Pulmonary/Chest: Effort normal and breath sounds normal. No stridor. No respiratory distress.  Abdominal: She exhibits no distension.  Musculoskeletal: She exhibits no edema.  Neurological: She is alert and oriented to person, place, and time. No cranial nerve deficit. She exhibits normal muscle tone. Coordination normal.  No object of loss of sensation or strength in any distribution  Skin: Skin is warm and dry.  Psychiatric: She has a normal mood and affect.    ED Course  Procedures (including critical care time) Labs Review Labs Reviewed  CBC WITH DIFFERENTIAL - Abnormal; Notable for the following:    Hemoglobin 11.1 (*)    HCT 33.5 (*)    Monocytes Relative 16 (*)    All other components within normal limits  COMPREHENSIVE METABOLIC PANEL - Abnormal; Notable for the following:    Potassium 2.8 (*)    Chloride 93 (*)    CO2 42 (*)    BUN 4 (*)    Albumin 2.7 (*)    GFR calc non Af Amer 69 (*)    GFR calc Af Amer 80 (*)    All other components within normal limits  LIPASE, BLOOD  URINALYSIS, ROUTINE W REFLEX MICROSCOPIC    Imaging Review Dg Chest 2  View  06/26/2014   CLINICAL DATA:  Tightness in chest and nausea since last night. Productive cough and congestion.  EXAM: CHEST  2 VIEW  COMPARISON:  One-view chest 06/09/2014.  FINDINGS: The heart size is normal. There is mild prominence the pulmonary arteries. Emphysematous changes are again seen. Scarring at the right base is stable. The lungs  are otherwise clear. The visualized soft tissues and bony thorax are unremarkable.  IMPRESSION: 1. Emphysema. 2. Scarring or atelectasis at the right lung base is stable. 3. No acute cardiopulmonary disease.   Electronically Signed   By: Lawrence Santiago M.D.   On: 06/26/2014 15:50   Ct Head Wo Contrast  06/26/2014   CLINICAL DATA:  Patient reports mild numbness for 4 days. Left arm tingling last night which resolved.  EXAM: CT HEAD WITHOUT CONTRAST  TECHNIQUE: Contiguous axial images were obtained from the base of the skull through the vertex without intravenous contrast.  COMPARISON:  Head CT scan 04/06/2014.  FINDINGS: A few areas of mild hypoattenuation in the periventricular deep white matter are unchanged. There is no evidence of acute abnormality including infarction, hemorrhage, mass lesion, mass effect, midline shift or abnormal extra-axial fluid collection. No hydrocephalus or pneumocephalus. Mucosal thickening is seen in the superior aspect of the right maxillary sinus which appears new since the prior examination.  IMPRESSION: No acute intracranial abnormality.  New mucosal thickening right maxillary sinus is partially visualized.   Electronically Signed   By: Inge Rise M.D.   On: 06/26/2014 16:34     EKG Interpretation   Date/Time:  Wednesday June 26 2014 15:10:22 EDT Ventricular Rate:  79 PR Interval:  163 QRS Duration: 100 QT Interval:  526 QTC Calculation: 603 R Axis:   77 Text Interpretation:  Sinus rhythm Borderline repolarization abnormality  Prolonged QT interval Sinus rhythm Artifact Abnormal ekg Confirmed by  Carmin Muskrat  MD [phone]) on 06/26/2014 3:37:09 PM     7:28 PM Patient states that she is regaining sensation in the perioral area MDM   Patient presents with new.  Oral numbness, left arm tingling.  This would be an unusual presentation for ACS, and the patient takes Plavix Patient had resolution of symptoms with supplementation for her notably low potassium level.  With no ongoing complaints, repletion achieved the patient was discharged in stable condition to have repeat labs drawn with her physician within 5 days.     Carmin Muskrat, MD 06/26/14 [phone]

## 2014-06-26 NOTE — ED Notes (Signed)
CRITICAL VALUE ALERT  Critical value received:  Potassium 2.8,  co2 42  Date of notification:  06/26/14  Time of notification:  9381  Critical value read back: yes  Nurse who received alert:  J.Elysse Polidore rn  MD notified (1st page):  [place]  Time of first page:  *[phone]  MD notified (2nd page):  Time of second page:  Responding MD:  [place]  Time MD responded:  [phone]

## 2014-06-26 NOTE — Discharge Instructions (Signed)
As discussed, it is very important that you monitor your condition carefully.  Please be sure to followup with her physician on Monday to have a repeat blood draw.  If you develop new, or concerning changes in the interim, please be sure to return here immediately.   Hypokalemia Hypokalemia means that the amount of potassium in the blood is lower than normal.Potassium is a chemical, called an electrolyte, that helps regulate the amount of fluid in the body. It also stimulates muscle contraction and helps nerves function properly.Most of the body's potassium is inside of cells, and only a very small amount is in the blood. Because the amount in the blood is so small, minor changes can be life-threatening. CAUSES  Antibiotics.  Diarrhea or vomiting.  Using laxatives too much, which can cause diarrhea.  Chronic kidney disease.  Water pills (diuretics).  Eating disorders (bulimia).  Low magnesium level.  Sweating a lot. SIGNS AND SYMPTOMS  Weakness.  Constipation.  Fatigue.  Muscle cramps.  Mental confusion.  Skipped heartbeats or irregular heartbeat (palpitations).  Tingling or numbness. DIAGNOSIS  Your health care provider can diagnose hypokalemia with blood tests. In addition to checking your potassium level, your health care provider may also check other lab tests. TREATMENT Hypokalemia can be treated with potassium supplements taken by mouth or adjustments in your current medicines. If your potassium level is very low, you may need to get potassium through a vein (IV) and be monitored in the hospital. A diet high in potassium is also helpful. Foods high in potassium are:  Nuts, such as peanuts and pistachios.  Seeds, such as sunflower seeds and pumpkin seeds.  Peas, lentils, and lima beans.  Whole grain and bran cereals and breads.  Fresh fruit and vegetables, such as apricots, avocado, bananas, cantaloupe, kiwi, oranges, tomatoes, asparagus, and  potatoes.  Orange and tomato juices.  Red meats.  Fruit yogurt. HOME CARE INSTRUCTIONS  Take all medicines as prescribed by your health care provider.  Maintain a healthy diet by including nutritious food, such as fruits, vegetables, nuts, whole grains, and lean meats.  If you are taking a laxative, be sure to follow the directions on the label. SEEK MEDICAL CARE IF:  Your weakness gets worse.  You feel your heart pounding or racing.  You are vomiting or having diarrhea.  You are diabetic and having trouble keeping your blood glucose in the normal range. SEEK IMMEDIATE MEDICAL CARE IF:  You have chest pain, shortness of breath, or dizziness.  You are vomiting or having diarrhea for more than 2 days.  You faint. MAKE SURE YOU:   Understand these instructions.  Will watch your condition.  Will get help right away if you are not doing well or get worse. Document Released: 08/30/2005 Document Revised: 06/20/2013 Document Reviewed: 03/02/2013 [facility] Patient Information 2015 [place], [place]. This information is not intended to replace advice given to you by your health care provider. Make sure you discuss any questions you have with your health care provider.

## 2014-06-26 NOTE — ED Notes (Signed)
Pt reports mouth has felt numb since Sunday.  Reports left arm tingling last night but went away.  Says tingling sensation came back this morning.

## 2014-11-24 ENCOUNTER — Emergency Department (HOSPITAL_COMMUNITY)
Admission: EM | Admit: 2014-11-24 | Discharge: 2014-11-24 | Disposition: A | Discharge status: Home / Self Care | Payer: Medicare HMO | Attending: Emergency Medicine | Admitting: Emergency Medicine

## 2014-11-24 ENCOUNTER — Emergency Department (HOSPITAL_COMMUNITY): Payer: Medicare HMO

## 2014-11-24 ENCOUNTER — Encounter (HOSPITAL_COMMUNITY): Payer: Self-pay | Admitting: Emergency Medicine

## 2014-11-24 DIAGNOSIS — F419 Anxiety disorder, unspecified: Secondary | ICD-10-CM | POA: Diagnosis not present

## 2014-11-24 DIAGNOSIS — J441 Chronic obstructive pulmonary disease with (acute) exacerbation: Secondary | ICD-10-CM | POA: Diagnosis not present

## 2014-11-24 DIAGNOSIS — K219 Gastro-esophageal reflux disease without esophagitis: Secondary | ICD-10-CM | POA: Insufficient documentation

## 2014-11-24 DIAGNOSIS — G8929 Other chronic pain: Secondary | ICD-10-CM | POA: Insufficient documentation

## 2014-11-24 DIAGNOSIS — Z791 Long term (current) use of non-steroidal anti-inflammatories (NSAID): Secondary | ICD-10-CM | POA: Diagnosis not present

## 2014-11-24 DIAGNOSIS — Z79899 Other long term (current) drug therapy: Secondary | ICD-10-CM | POA: Diagnosis not present

## 2014-11-24 DIAGNOSIS — I10 Essential (primary) hypertension: Secondary | ICD-10-CM | POA: Diagnosis not present

## 2014-11-24 DIAGNOSIS — R1310 Dysphagia, unspecified: Secondary | ICD-10-CM | POA: Insufficient documentation

## 2014-11-24 DIAGNOSIS — F329 Major depressive disorder, single episode, unspecified: Secondary | ICD-10-CM | POA: Diagnosis not present

## 2014-11-24 DIAGNOSIS — Z792 Long term (current) use of antibiotics: Secondary | ICD-10-CM | POA: Insufficient documentation

## 2014-11-24 DIAGNOSIS — Z720 Tobacco use: Secondary | ICD-10-CM | POA: Insufficient documentation

## 2014-11-24 DIAGNOSIS — R079 Chest pain, unspecified: Secondary | ICD-10-CM | POA: Diagnosis present

## 2014-11-24 DIAGNOSIS — Z9981 Dependence on supplemental oxygen: Secondary | ICD-10-CM | POA: Insufficient documentation

## 2014-11-24 LAB — COMPREHENSIVE METABOLIC PANEL
ALK PHOS: 77 U/L (ref 39–117)
ALT: 9 U/L (ref 0–35)
ANION GAP: 5 (ref 5–15)
AST: 15 U/L (ref 0–37)
Albumin: 3.3 g/dL — ABNORMAL LOW (ref 3.5–5.2)
BUN: 6 mg/dL (ref 6–23)
CALCIUM: 9 mg/dL (ref 8.4–10.5)
CHLORIDE: 97 mmol/L (ref 96–112)
CO2: 37 mmol/L — ABNORMAL HIGH (ref 19–32)
CREATININE: 1.09 mg/dL (ref 0.50–1.10)
GFR calc Af Amer: 61 mL/min — ABNORMAL LOW (ref >90)
GFR calc non Af Amer: 52 mL/min — ABNORMAL LOW (ref >90)
GLUCOSE: 89 mg/dL (ref 70–99)
POTASSIUM: 3 mmol/L — AB (ref 3.5–5.1)
Sodium: 139 mmol/L (ref 135–145)
Total Bilirubin: 0.3 mg/dL (ref 0.3–1.2)
Total Protein: 6.7 g/dL (ref 6.0–8.3)

## 2014-11-24 LAB — URINALYSIS, ROUTINE W REFLEX MICROSCOPIC
BILIRUBIN URINE: NEGATIVE
Glucose, UA: NEGATIVE mg/dL
Hgb urine dipstick: NEGATIVE
KETONES UR: NEGATIVE mg/dL
Nitrite: NEGATIVE
Protein, ur: NEGATIVE mg/dL
Specific Gravity, Urine: 1.015 (ref 1.005–1.030)
UROBILINOGEN UA: 0.2 mg/dL (ref 0.0–1.0)
pH: 6 (ref 5.0–8.0)

## 2014-11-24 LAB — CBC WITH DIFFERENTIAL/PLATELET
Basophils Absolute: 0 10*3/uL (ref 0.0–0.1)
Basophils Relative: 1 % (ref 0–1)
Eosinophils Absolute: 0.3 10*3/uL (ref 0.0–0.7)
Eosinophils Relative: 6 % — ABNORMAL HIGH (ref 0–5)
HEMATOCRIT: 38.2 % (ref 36.0–46.0)
HEMOGLOBIN: 11.5 g/dL — AB (ref 12.0–15.0)
LYMPHS ABS: 1.5 10*3/uL (ref 0.7–4.0)
Lymphocytes Relative: 26 % (ref 12–46)
MCH: 27.9 pg (ref 26.0–34.0)
MCHC: 30.1 g/dL (ref 30.0–36.0)
MCV: 92.7 fL (ref 78.0–100.0)
MONOS PCT: 11 % (ref 3–12)
Monocytes Absolute: 0.6 10*3/uL (ref 0.1–1.0)
NEUTROS ABS: 3.4 10*3/uL (ref 1.7–7.7)
Neutrophils Relative %: 58 % (ref 43–77)
Platelets: 245 10*3/uL (ref 150–400)
RBC: 4.12 MIL/uL (ref 3.87–5.11)
RDW: 14.5 % (ref 11.5–15.5)
WBC: 5.9 10*3/uL (ref 4.0–10.5)

## 2014-11-24 LAB — URINE MICROSCOPIC-ADD ON

## 2014-11-24 LAB — TROPONIN I

## 2014-11-24 MED ORDER — GI COCKTAIL ~~LOC~~
30.0000 mL | Freq: Once | ORAL | Status: AC
  Administered 2014-11-24: 30 mL via ORAL
  Filled 2014-11-24: qty 30

## 2014-11-24 MED ORDER — IPRATROPIUM-ALBUTEROL 0.5-2.5 (3) MG/3ML IN SOLN: 3.0000 mL | Freq: Once | RESPIRATORY_TRACT | Status: DC

## 2014-11-24 MED ORDER — SODIUM CHLORIDE 0.9 % IV BOLUS (SEPSIS)
500.0000 mL | Freq: Once | INTRAVENOUS | Status: AC
  Administered 2014-11-24: 500 mL via INTRAVENOUS

## 2014-11-24 MED ORDER — IPRATROPIUM-ALBUTEROL 0.5-2.5 (3) MG/3ML IN SOLN
3.0000 mL | Freq: Once | RESPIRATORY_TRACT | Status: AC
  Administered 2014-11-24: 3 mL via RESPIRATORY_TRACT
  Filled 2014-11-24: qty 3

## 2014-11-24 MED ORDER — PREDNISONE 20 MG PO TABS: ORAL_TABLET | ORAL | Status: DC

## 2014-11-24 MED ORDER — METHYLPREDNISOLONE SODIUM SUCC 125 MG IJ SOLR
125.0000 mg | Freq: Once | INTRAMUSCULAR | Status: AC
  Administered 2014-11-24: 125 mg via INTRAVENOUS
  Filled 2014-11-24: qty 2

## 2014-11-24 NOTE — ED Provider Notes (Signed)
CSN: [id number]     Arrival date & time 11/24/14  1354 History  This chart was scribed for Olivia Morrison, MD by Stephania Fragmin, ED Scribe. This patient was seen in room [facility]07/[facility]07 and the patient's care was started at 3:07 PM.    Chief Complaint  Patient presents with  . Chest Pain   Patient is a 64 y.o. female presenting with chest pain. The history is provided by the patient. No language interpreter was used.  Chest Pain Chest pain location: central chest pain. Pain radiates to:  Does not radiate Pain radiates to the back: no   Pain severity:  Moderate Duration:  1 day Timing:  Constant Progression:  Worsening Chronicity:  Recurrent Relieved by:  Nothing Worsened by:  Nothing tried Ineffective treatments:  None tried Associated symptoms: no lower extremity edema   Risk factors: hypertension and smoking   Risk factors: no high cholesterol, no immobilization, no prior DVT/PE and no surgery (no recent surgery)      HPI Comments: Olivia Randall is a 64 y.o. female with a history of GERD, hypertension, and COPD on 2L of home O2 who presents to the Emergency Department complaining of constant centralized chest pain that began 3-4 weeks ago and acutely worsened last night. She states that when she eats, she feels like "it doesn't go down." When asked if she has noticed any leg swelling, she states that she feels as if her whole body is swollen. Patient has had the same problem before, but states that this episode is more severe. She states she has inhalers at home, which she takes 3 times a day. Patient denies ever having a UGI endoscopy done. She notes a history of MS, tremors, and sleep apnea. She has a history of cholecystectomy. She denies cardiac problems, DM, or hyperlipidemia. Patient smokes occasionally. She denies any recent surgeries or history of DVT/PE or CA.   Past Medical History  Diagnosis Date  . COPD (chronic obstructive pulmonary disease)   . Hypertension   . Sleep apnea   .  Depression   . On home O2     2L N/C  . Chronic back pain   . Anxiety and depression   . GERD (gastroesophageal reflux disease)   . Chronic neck pain    Past Surgical History  Procedure Laterality Date  . Abdominal hysterectomy    . Bladder surgery    . Breast surgery    . Hemorrhoid surgery    . Carpal tunnel release    . Neck surgery    . Shoulder surgery Right    Family History  Problem Relation Age of Onset  . Diabetes Mother   . Diabetes Father   . Diabetes Brother    History  Substance Use Topics  . Smoking status: Current Every Day Smoker    Types: Cigarettes  . Smokeless tobacco: Not on file  . Alcohol Use: No   OB History    Gravida Para Term Preterm AB TAB SAB Ectopic Multiple Living   1 1 1             Review of Systems  Cardiovascular: Positive for chest pain.  All other systems reviewed and are negative.     Allergies  Review of patient's allergies indicates no known allergies.  Home Medications   Prior to Admission medications   Medication Sig Start Date End Date Taking? Authorizing Provider  albuterol (PROAIR HFA) 108 (90 BASE) MCG/ACT inhaler Inhale 2 puffs into the lungs every  6 (six) hours as needed for shortness of breath.    Yes Historical Provider, MD  albuterol (PROVENTIL) (2.5 MG/3ML) 0.083% nebulizer solution Take 2.5 mg by nebulization every 6 (six) hours as needed for shortness of breath (Mixed with Ipratropium).    Yes Historical Provider, MD  ALPRAZolam Duanne Moron) 1 MG tablet Take 1 mg by mouth 4 (four) times daily as needed for anxiety.    Yes Historical Provider, MD  Aspirin-Acetaminophen-Caffeine (GOODY HEADACHE PO) Take 1 Package by mouth every 4 (four) hours as needed (Pain).   Yes Historical Provider, MD  cyclobenzaprine (FLEXERIL) 10 MG tablet Take 10 mg by mouth 3 (three) times daily as needed for muscle spasms.    Yes Historical Provider, MD  dicyclomine (BENTYL) 10 MG capsule Take 10 mg by mouth 2 (two) times daily. 11/04/14  Yes  Historical Provider, MD  donepezil (ARICEPT) 10 MG tablet Take 10 mg by mouth daily. 11/04/14  Yes Historical Provider, MD  donepezil (ARICEPT) 5 MG tablet Take 5 mg by mouth every morning.   Yes Historical Provider, MD  DULERA 100-5 MCG/ACT AERO Take 2 puffs by mouth 2 (two) times daily. 11/04/14  Yes Historical Provider, MD  esomeprazole (NEXIUM) 40 MG capsule Take 40 mg by mouth daily. 11/04/14  Yes Historical Provider, MD  furosemide (LASIX) 20 MG tablet Take 20 mg by mouth every morning.    Yes Historical Provider, MD  Nyoka Cowden Tea, Camillia sinensis, (GREEN TEA PO) Take 1 tablet by mouth daily.   Yes Historical Provider, MD  guaiFENesin (MUCINEX) 600 MG 12 hr tablet Take 600 mg by mouth 2 (two) times daily as needed for to loosen phlegm.  05/22/13  Yes Lezlie Octave Black, NP  ipratropium (ATROVENT) 0.02 % nebulizer solution Take 500 mcg by nebulization every 6 (six) hours as needed for wheezing (*To be mixed with Albuterol for treatments via nebulization*).   Yes Historical Provider, MD  lisinopril (PRINIVIL,ZESTRIL) 10 MG tablet Take 10 mg by mouth daily.   Yes Historical Provider, MD  loratadine (CLARITIN) 10 MG tablet Take 10 mg by mouth every morning.    Yes Historical Provider, MD  meloxicam (MOBIC) 7.5 MG tablet Take 7.5 mg by mouth every morning.   Yes Historical Provider, MD  Multiple Vitamin (MULTIVITAMIN WITH MINERALS) TABS tablet Take 1 tablet by mouth daily.   Yes Historical Provider, MD  omeprazole (PRILOSEC) 20 MG capsule Take 20 mg by mouth 2 (two) times daily.    Yes Historical Provider, MD  Oxycodone HCl 20 MG TABS Take 20 mg by mouth 4 (four) times daily as needed. 11/04/14  Yes Historical Provider, MD  PARoxetine (PAXIL) 40 MG tablet Take 40 mg by mouth every morning.   Yes Historical Provider, MD  propranolol ER (INDERAL LA) 60 MG 24 hr capsule Take 60 mg by mouth daily. 11/04/14  Yes Historical Provider, MD  SPIRIVA HANDIHALER 18 MCG inhalation capsule Take 2 puffs by mouth 2 (two) times  daily. 11/04/14  Yes Historical Provider, MD  oxyCODONE (OXY IR/ROXICODONE) 5 MG immediate release tablet Take 1 tablet (5 mg total) by mouth every 6 (six) hours as needed. Patient not taking: Reported on 11/24/2014 05/22/13   Radene Gunning, NP  predniSONE (DELTASONE) 20 MG tablet 2 tabs po daily x 4 days 11/25/14   Olivia Morrison, MD   BP 121/65 mmHg  Pulse 81  Temp(Src) 98.5 F (36.9 C) (Oral)  Resp 18  Ht 5\' 6"  (1.676 m)  Wt 152 lb (68.947 kg)  BMI 24.55 kg/m2  SpO2 98% Physical Exam  Constitutional: She is oriented to person, place, and time. She appears well-developed and well-nourished. No distress.  HENT:  Head: Normocephalic and atraumatic.  Mild dry mm.  Eyes: Conjunctivae and EOM are normal.  Neck: Neck supple. No tracheal deviation present.  Cardiovascular: Normal rate.   Pulmonary/Chest: No respiratory distress. She has wheezes.  Expiratory wheezes bilaterally. No respiratory difficulties or distress.  Abdominal: Soft. There is no tenderness.  Musculoskeletal: Normal range of motion.  Neurological: She is alert and oriented to person, place, and time.  Skin: Skin is warm and dry.  Psychiatric: She has a normal mood and affect. Her behavior is normal.  Nursing note and vitals reviewed.   ED Course  Procedures (including critical care time)  DIAGNOSTIC STUDIES: Oxygen Saturation is 97% on room air, normal by my interpretation.    COORDINATION OF CARE: 3:13 PM - Discussed treatment plan with pt at bedside which includes follow-up with GI specialist for GERD, treatment for COPD, and screening for MI, and pt agreed to plan.   Labs Review Labs Reviewed  URINALYSIS, ROUTINE W REFLEX MICROSCOPIC - Abnormal; Notable for the following:    Leukocytes, UA SMALL (*)    All other components within normal limits  CBC WITH DIFFERENTIAL/PLATELET - Abnormal; Notable for the following:    Hemoglobin 11.5 (*)    Eosinophils Relative 6 (*)    All other components within normal  limits  COMPREHENSIVE METABOLIC PANEL - Abnormal; Notable for the following:    Potassium 3.0 (*)    CO2 37 (*)    Albumin 3.3 (*)    GFR calc non Af Amer 52 (*)    GFR calc Af Amer 61 (*)    All other components within normal limits  URINE MICROSCOPIC-ADD ON - Abnormal; Notable for the following:    Squamous Epithelial / LPF FEW (*)    All other components within normal limits  TROPONIN I    Imaging Review Dg Chest 2 View  11/24/2014   CLINICAL DATA:  64 year old female with generalized chest pain and cough.  EXAM: CHEST  2 VIEW  COMPARISON:  Chest x-ray 06/26/2014.  FINDINGS: Diffuse peribronchial cuffing. Lung volumes are normal. Chronic scarring and bronchiectatic changes in the right lung base, similar to prior examinations. No acute consolidative airspace disease. No pleural effusions. No evidence of pulmonary edema. Heart size is normal. The patient is rotated to the right on today's exam, resulting in distortion of the mediastinal contours and reduced diagnostic sensitivity and specificity for mediastinal pathology. Atherosclerosis in the thoracic aorta. Orthopedic fixation hardware in the lower cervical spine incidentally noted.  IMPRESSION: 1. Diffuse peribronchial cuffing concerning for acute bronchitis. 2. Chronic scarring and bronchiectasis in the right lower lobe redemonstrated. 3. Atherosclerosis.   Electronically Signed   By: Vinnie Langton M.D.   On: 11/24/2014 15:27     EKG Interpretation   Date/Time:  Sunday November 24 2014 14:03:42 EDT Ventricular Rate:  80 PR Interval:  168 QRS Duration: 84 QT Interval:  404 QTC Calculation: 465 R Axis:   79 Text Interpretation:  Normal sinus rhythm Normal ECG Confirmed by COOK   MD, BRIAN ([id number]) on 11/24/2014 2:07:27 PM      MDM   Final diagnoses:  Acute exacerbation of chronic obstructive pulmonary disease (COPD)  Dysphagia   I personally performed the services described in this documentation, which was scribed in my  presence. The recorded information has been reviewed and is  accurate.  Patient presents with chest tightness and shortness of breath similar to COPD history. Primary reason for visit Ms. pain and difficulty swallowing solids and liquids is gradually worsened for a month. Patient has never had egd in the past. Her upper chest discomfort is only with attempted eating. Patient does not appear significantly dehydrated on exam.  Discussed likely 3 components of her presentation primarily concern for esophageal stricture/esophagus related pain, acute COPD exacerbation. Although pain atypical and likely GI related plan for cardiac screen prior to discharge. Patient is willing to wait for troponin, she is requesting to go home, nebulizer ordered due to wheezing.  Results and differential diagnosis were discussed with the patient/parent/guardian. Close follow up outpatient was discussed, comfortable with the plan.   Medications  methylPREDNISolone sodium succinate (SOLU-MEDROL) 125 mg/2 mL injection 125 mg (125 mg Intravenous Given 11/24/14 1530)  sodium chloride 0.9 % bolus 500 mL (0 mLs Intravenous Stopped 11/24/14 1639)  ipratropium-albuterol (DUONEB) 0.5-2.5 (3) MG/3ML nebulizer solution 3 mL (3 mLs Nebulization Given 11/24/14 1544)  gi cocktail (Maalox,Lidocaine,Donnatal) (30 mLs Oral Given 11/24/14 1527)    Filed Vitals:   11/24/14 1359 11/24/14 1638 11/24/14 1639 11/24/14 1658  BP: 137/64 121/65 121/65   Pulse: 82 77 76 81  Temp: 98.5 F (36.9 C)     TempSrc: Oral     Resp: 16  18   Height: 5\' 6"  (1.676 m)     Weight: 152 lb (68.947 kg)     SpO2: 97% 99% 99% 98%    Final diagnoses:  Acute exacerbation of chronic obstructive pulmonary disease (COPD)  Dysphagia        Olivia Morrison, MD 11/24/14 2234

## 2014-11-24 NOTE — ED Notes (Signed)
Pt reports centralized chest pain that started yesterday. Pt states the pain became worse last night but lessened during the night. Pt c/o pain starting again today.

## 2014-11-24 NOTE — Discharge Instructions (Signed)
Call gastroenterologist to discuss possible stricture.  If you were given medicines take as directed.  If you are on coumadin or contraceptives realize their levels and effectiveness is altered by many different medicines.  If you have any reaction (rash, tongues swelling, other) to the medicines stop taking and see a physician.   Please follow up as directed and return to the ER or see a physician for new or worsening symptoms.  Thank you. Filed Vitals:   11/24/14 1359  BP: 137/64  Pulse: 82  Temp: 98.5 F (36.9 C)  TempSrc: Oral  Resp: 16  Height: 5\' 6"  (1.676 m)  Weight: 152 lb (68.947 kg)  SpO2: 97%

## 2014-11-25 ENCOUNTER — Telehealth: Payer: Self-pay

## 2014-11-25 NOTE — Telephone Encounter (Signed)
Patient was seen in the ER last night and was told to follow up with [place]. She has never been here before. Please advise

## 2014-11-29 NOTE — Telephone Encounter (Signed)
I don't see any visits to other GI offices. Should be good to schedule for a new patient appointment with someone. Thanks!

## 2014-12-04 ENCOUNTER — Encounter: Payer: Self-pay | Admitting: Internal Medicine

## 2014-12-04 NOTE — Telephone Encounter (Signed)
APPOINTMENT MADE AND LETTER SENT °

## 2014-12-04 NOTE — Telephone Encounter (Signed)
Pt make her an appointment.

## 2014-12-23 ENCOUNTER — Encounter: Payer: Self-pay | Admitting: Nurse Practitioner

## 2014-12-24 ENCOUNTER — Ambulatory Visit: Payer: Medicare HMO | Admitting: Nurse Practitioner

## 2015-01-06 ENCOUNTER — Ambulatory Visit: Payer: Medicare HMO | Admitting: Gastroenterology

## 2015-01-07 ENCOUNTER — Encounter (HOSPITAL_COMMUNITY): Payer: Self-pay | Admitting: Emergency Medicine

## 2015-01-07 ENCOUNTER — Emergency Department (HOSPITAL_COMMUNITY): Payer: Medicare HMO

## 2015-01-07 ENCOUNTER — Emergency Department (HOSPITAL_COMMUNITY)
Admission: EM | Admit: 2015-01-07 | Discharge: 2015-01-07 | Disposition: A | Discharge status: Home / Self Care | Payer: Medicare HMO | Attending: Emergency Medicine | Admitting: Emergency Medicine

## 2015-01-07 DIAGNOSIS — E876 Hypokalemia: Secondary | ICD-10-CM | POA: Insufficient documentation

## 2015-01-07 DIAGNOSIS — Z9981 Dependence on supplemental oxygen: Secondary | ICD-10-CM | POA: Insufficient documentation

## 2015-01-07 DIAGNOSIS — R079 Chest pain, unspecified: Secondary | ICD-10-CM | POA: Diagnosis present

## 2015-01-07 DIAGNOSIS — Z79899 Other long term (current) drug therapy: Secondary | ICD-10-CM | POA: Insufficient documentation

## 2015-01-07 DIAGNOSIS — F329 Major depressive disorder, single episode, unspecified: Secondary | ICD-10-CM | POA: Insufficient documentation

## 2015-01-07 DIAGNOSIS — K219 Gastro-esophageal reflux disease without esophagitis: Secondary | ICD-10-CM | POA: Insufficient documentation

## 2015-01-07 DIAGNOSIS — Z791 Long term (current) use of non-steroidal anti-inflammatories (NSAID): Secondary | ICD-10-CM | POA: Diagnosis not present

## 2015-01-07 DIAGNOSIS — Z720 Tobacco use: Secondary | ICD-10-CM | POA: Diagnosis not present

## 2015-01-07 DIAGNOSIS — G8929 Other chronic pain: Secondary | ICD-10-CM | POA: Insufficient documentation

## 2015-01-07 DIAGNOSIS — J441 Chronic obstructive pulmonary disease with (acute) exacerbation: Secondary | ICD-10-CM | POA: Insufficient documentation

## 2015-01-07 DIAGNOSIS — J40 Bronchitis, not specified as acute or chronic: Secondary | ICD-10-CM

## 2015-01-07 DIAGNOSIS — I10 Essential (primary) hypertension: Secondary | ICD-10-CM | POA: Diagnosis not present

## 2015-01-07 DIAGNOSIS — F419 Anxiety disorder, unspecified: Secondary | ICD-10-CM | POA: Insufficient documentation

## 2015-01-07 DIAGNOSIS — G4730 Sleep apnea, unspecified: Secondary | ICD-10-CM | POA: Diagnosis not present

## 2015-01-07 LAB — CBC
HCT: 37.2 % (ref 36.0–46.0)
Hemoglobin: 11.7 g/dL — ABNORMAL LOW (ref 12.0–15.0)
MCH: 27.7 pg (ref 26.0–34.0)
MCHC: 31.5 g/dL (ref 30.0–36.0)
MCV: 87.9 fL (ref 78.0–100.0)
PLATELETS: 260 10*3/uL (ref 150–400)
RBC: 4.23 MIL/uL (ref 3.87–5.11)
RDW: 14.3 % (ref 11.5–15.5)
WBC: 13.7 10*3/uL — ABNORMAL HIGH (ref 4.0–10.5)

## 2015-01-07 LAB — BASIC METABOLIC PANEL
Anion gap: 9 (ref 5–15)
BUN: 5 mg/dL — AB (ref 6–23)
CALCIUM: 9.6 mg/dL (ref 8.4–10.5)
CHLORIDE: 88 mmol/L — AB (ref 96–112)
CO2: 36 mmol/L — ABNORMAL HIGH (ref 19–32)
CREATININE: 0.82 mg/dL (ref 0.50–1.10)
GFR, EST AFRICAN AMERICAN: 85 mL/min — AB (ref >90)
GFR, EST NON AFRICAN AMERICAN: 73 mL/min — AB (ref >90)
Glucose, Bld: 99 mg/dL (ref 70–99)
Potassium: 2.9 mmol/L — ABNORMAL LOW (ref 3.5–5.1)
SODIUM: 133 mmol/L — AB (ref 135–145)

## 2015-01-07 LAB — HEPATIC FUNCTION PANEL
ALK PHOS: 95 U/L (ref 39–117)
ALT: 12 U/L (ref 0–35)
AST: 21 U/L (ref 0–37)
Albumin: 3.2 g/dL — ABNORMAL LOW (ref 3.5–5.2)
BILIRUBIN DIRECT: 0.1 mg/dL (ref 0.0–0.5)
BILIRUBIN INDIRECT: 0.2 mg/dL — AB (ref 0.3–0.9)
TOTAL PROTEIN: 6.2 g/dL (ref 6.0–8.3)
Total Bilirubin: 0.3 mg/dL (ref 0.3–1.2)

## 2015-01-07 LAB — BRAIN NATRIURETIC PEPTIDE: B Natriuretic Peptide: 31 pg/mL (ref 0.0–100.0)

## 2015-01-07 LAB — TROPONIN I

## 2015-01-07 MED ORDER — ACETAMINOPHEN 325 MG PO TABS
ORAL_TABLET | ORAL | Status: AC
  Administered 2015-01-07: 650 mg via ORAL
  Filled 2015-01-07: qty 2

## 2015-01-07 MED ORDER — ACETAMINOPHEN 325 MG PO TABS
650.0000 mg | ORAL_TABLET | Freq: Once | ORAL | Status: AC
  Administered 2015-01-07: 650 mg via ORAL

## 2015-01-07 MED ORDER — ALBUTEROL SULFATE (2.5 MG/3ML) 0.083% IN NEBU
2.5000 mg | INHALATION_SOLUTION | Freq: Once | RESPIRATORY_TRACT | Status: AC
  Administered 2015-01-07: 2.5 mg via RESPIRATORY_TRACT
  Filled 2015-01-07: qty 3

## 2015-01-07 MED ORDER — POTASSIUM CHLORIDE CRYS ER 20 MEQ PO TBCR
40.0000 meq | EXTENDED_RELEASE_TABLET | Freq: Once | ORAL | Status: AC
  Administered 2015-01-07: 40 meq via ORAL
  Filled 2015-01-07: qty 2

## 2015-01-07 MED ORDER — AZITHROMYCIN 250 MG PO TABS: ORAL_TABLET | ORAL | Status: DC

## 2015-01-07 MED ORDER — IPRATROPIUM-ALBUTEROL 0.5-2.5 (3) MG/3ML IN SOLN
3.0000 mL | Freq: Once | RESPIRATORY_TRACT | Status: AC
  Administered 2015-01-07: 3 mL via RESPIRATORY_TRACT
  Filled 2015-01-07: qty 3

## 2015-01-07 MED ORDER — POTASSIUM CHLORIDE CRYS ER 20 MEQ PO TBCR: 20.0000 meq | EXTENDED_RELEASE_TABLET | Freq: Every day | ORAL | Status: DC

## 2015-01-07 NOTE — ED Notes (Signed)
Patient complaining of chest pain starting today. States "I have been coughing but I can't get anything up." Also complaining of worsening shortness of breath. Patient is on 2L continuous home O2.

## 2015-01-07 NOTE — ED Provider Notes (Signed)
CSN: [id number]     Arrival date & time 01/07/15  1637 History   First MD Initiated Contact with Patient 01/07/15 1756     Chief Complaint  Patient presents with  . Chest Pain  . Headache     (Consider location/radiation/quality/duration/timing/severity/associated sxs/prior Treatment) Patient is a 64 y.o. female presenting with cough. The history is provided by the patient (the pt complains of a cough and sob).  Cough Cough characteristics:  Non-productive Severity:  Moderate Onset quality:  Sudden Timing:  Constant Progression:  Waxing and waning Chronicity:  New Smoker: no   Context: not animal exposure   Associated symptoms: no chest pain, no eye discharge, no headaches and no rash     Past Medical History  Diagnosis Date  . COPD (chronic obstructive pulmonary disease)   . Hypertension   . Sleep apnea   . Depression   . On home O2     2L N/C  . Chronic back pain   . Anxiety and depression   . GERD (gastroesophageal reflux disease)   . Chronic neck pain    Past Surgical History  Procedure Laterality Date  . Abdominal hysterectomy    . Bladder surgery    . Breast surgery    . Hemorrhoid surgery    . Carpal tunnel release    . Neck surgery    . Shoulder surgery Right    Family History  Problem Relation Age of Onset  . Diabetes Mother   . Diabetes Father   . Diabetes Brother    History  Substance Use Topics  . Smoking status: Current Every Day Smoker    Types: Cigarettes  . Smokeless tobacco: Not on file  . Alcohol Use: No   OB History    Gravida Para Term Preterm AB TAB SAB Ectopic Multiple Living   '1 1 1            '$ Review of Systems  Constitutional: Negative for appetite change and fatigue.  HENT: Negative for congestion, ear discharge and sinus pressure.   Eyes: Negative for discharge.  Respiratory: Positive for cough.   Cardiovascular: Negative for chest pain.  Gastrointestinal: Negative for abdominal pain and diarrhea.  Genitourinary:  Negative for frequency and hematuria.  Musculoskeletal: Negative for back pain.  Skin: Negative for rash.  Neurological: Negative for seizures and headaches.  Psychiatric/Behavioral: Negative for hallucinations.      Allergies  Review of patient's allergies indicates no known allergies.  Home Medications   Prior to Admission medications   Medication Sig Start Date End Date Taking? Authorizing Provider  albuterol (PROAIR HFA) 108 (90 BASE) MCG/ACT inhaler Inhale 2 puffs into the lungs every 6 (six) hours as needed for shortness of breath.    Yes Historical Provider, MD  albuterol (PROVENTIL) (2.5 MG/3ML) 0.083% nebulizer solution Take 2.5 mg by nebulization every 6 (six) hours as needed for shortness of breath (Mixed with Ipratropium).    Yes Historical Provider, MD  ALPRAZolam Duanne Moron) 1 MG tablet Take 1 mg by mouth 4 (four) times daily as needed for anxiety.    Yes Historical Provider, MD  Aspirin-Acetaminophen-Caffeine (GOODY HEADACHE PO) Take 1 Package by mouth every 4 (four) hours as needed (Pain).   Yes Historical Provider, MD  donepezil (ARICEPT) 10 MG tablet Take 10 mg by mouth at bedtime.  11/04/14  Yes Historical Provider, MD  donepezil (ARICEPT) 5 MG tablet Take 5 mg by mouth every morning.   Yes Historical Provider, MD  DULERA 100-5 MCG/ACT AERO  Take 2 puffs by mouth 2 (two) times daily. 11/04/14  Yes Historical Provider, MD  esomeprazole (NEXIUM) 40 MG capsule Take 40 mg by mouth daily. 11/04/14  Yes Historical Provider, MD  furosemide (LASIX) 20 MG tablet Take 20 mg by mouth every morning.    Yes Historical Provider, MD  guaiFENesin (MUCINEX) 600 MG 12 hr tablet Take 600 mg by mouth daily.  05/22/13  Yes Lezlie Octave Black, NP  ipratropium (ATROVENT) 0.02 % nebulizer solution Take 500 mcg by nebulization every 6 (six) hours as needed for wheezing (*To be mixed with Albuterol for treatments via nebulization*).   Yes Historical Provider, MD  lisinopril (PRINIVIL,ZESTRIL) 10 MG tablet Take  10 mg by mouth daily.   Yes Historical Provider, MD  loratadine (CLARITIN) 10 MG tablet Take 10 mg by mouth every morning.    Yes Historical Provider, MD  meloxicam (MOBIC) 7.5 MG tablet Take 7.5 mg by mouth every morning.   Yes Historical Provider, MD  omeprazole (PRILOSEC) 20 MG capsule Take 20 mg by mouth daily.    Yes Historical Provider, MD  Oxycodone HCl 20 MG TABS Take 20 mg by mouth 4 (four) times daily as needed (for pain).  11/04/14  Yes Historical Provider, MD  PARoxetine (PAXIL) 40 MG tablet Take 40 mg by mouth every morning.   Yes Historical Provider, MD  predniSONE (DELTASONE) 5 MG tablet Take 5-30 mg by mouth See admin instructions. Starting on 01/06/2015 take 6,5,4,3,2,1, then STOP   Yes Historical Provider, MD  [facility] HANDIHALER 18 MCG inhalation capsule Take 18 mcg by mouth daily.  11/04/14  Yes Historical Provider, MD  azithromycin (ZITHROMAX Z-PAK) 250 MG tablet 2 po day one, then 1 daily x 4 days 01/07/15   Milton Ferguson, MD  potassium chloride SA (K-DUR,KLOR-CON) 20 MEQ tablet Take 1 tablet (20 mEq total) by mouth daily. 01/07/15   Milton Ferguson, MD  predniSONE (DELTASONE) 20 MG tablet 2 tabs po daily x 4 days Patient not taking: Reported on 01/07/2015 11/25/14   Elnora Morrison, MD   BP 137/73 mmHg  Pulse 90  Temp(Src) 98.9 F (37.2 C) (Oral)  Resp 16  Ht '5\' 6"'$  (1.676 m)  Wt 152 lb (68.947 kg)  BMI 24.55 kg/m2  SpO2 97% Physical Exam  Constitutional: She is oriented to person, place, and time. She appears well-developed.  HENT:  Head: Normocephalic.  Eyes: Conjunctivae and EOM are normal. No scleral icterus.  Neck: Neck supple. No thyromegaly present.  Cardiovascular: Normal rate and regular rhythm.  Exam reveals no gallop and no friction rub.   No murmur heard. Pulmonary/Chest: No stridor. She has wheezes. She has no rales. She exhibits no tenderness.  Abdominal: She exhibits no distension. There is no tenderness. There is no rebound.  Musculoskeletal: Normal range of  motion. She exhibits no edema.  Lymphadenopathy:    She has no cervical adenopathy.  Neurological: She is oriented to person, place, and time. She exhibits normal muscle tone. Coordination normal.  Skin: No rash noted. No erythema.  Psychiatric: She has a normal mood and affect. Her behavior is normal.    ED Course  Procedures (including critical care time) Labs Review Labs Reviewed  CBC - Abnormal; Notable for the following:    WBC 13.7 (*)    Hemoglobin 11.7 (*)    All other components within normal limits  BASIC METABOLIC PANEL - Abnormal; Notable for the following:    Sodium 133 (*)    Potassium 2.9 (*)  Chloride 88 (*)    CO2 36 (*)    BUN 5 (*)    GFR calc non Af Amer 73 (*)    GFR calc Af Amer 85 (*)    All other components within normal limits  HEPATIC FUNCTION PANEL - Abnormal; Notable for the following:    Albumin 3.2 (*)    Indirect Bilirubin 0.2 (*)    All other components within normal limits  BRAIN NATRIURETIC PEPTIDE  TROPONIN I    Imaging Review Dg Chest 2 View  01/07/2015   CLINICAL DATA:  One day history of chest pain.  Shortness of Breath.  EXAM: CHEST  2 VIEW  COMPARISON:  November 24, 2014  FINDINGS: There is underlying emphysema. There is stable scarring in the right base. There is no edema or consolidation. The heart size is normal. The pulmonary vascularity reflects the underlying emphysema and is stable. No adenopathy. There is postoperative change in the lower cervical spine region.  IMPRESSION: Underlying emphysema. Scarring right base. No edema or consolidation.   Electronically Signed   By: Lowella Grip III M.D.   On: 01/07/2015 18:01     EKG Interpretation   Date/Time:  Tuesday January 07 2015 16:52:13 EDT Ventricular Rate:  89 PR Interval:  164 QRS Duration: 86 QT Interval:  410 QTC Calculation: 498 R Axis:   65 Text Interpretation:  Normal sinus rhythm Prolonged QT Abnormal ECG  Confirmed by Khan Chura  MD, Kegan Shepardson [id number]) on 01/07/2015  5:57:40 PM      MDM   Final diagnoses:  Bronchitis  Hypokalemia    Bronchitis and hypokalemia,  tx with zpak and potassium with close follow up with pcp    Milton Ferguson, MD 01/07/15 [phone]

## 2015-01-07 NOTE — ED Provider Notes (Signed)
CSN: [id number]     Arrival date & time 01/07/15  1637 History   First MD Initiated Contact with Patient 01/07/15 1756     Chief Complaint  Patient presents with  . Chest Pain  . Headache     (Consider location/radiation/quality/duration/timing/severity/associated sxs/prior Treatment) Patient is a 64 y.o. female presenting with cough. The history is provided by the patient (the pt complains of a cough and sob).  Cough Cough characteristics:  Productive Severity:  Moderate Onset quality:  Sudden Timing:  Intermittent Progression:  Waxing and waning Chronicity:  New Smoker: no   Context: not animal exposure   Associated symptoms: no chest pain, no eye discharge, no headaches and no rash     Past Medical History  Diagnosis Date  . COPD (chronic obstructive pulmonary disease)   . Hypertension   . Sleep apnea   . Depression   . On home O2     2L N/C  . Chronic back pain   . Anxiety and depression   . GERD (gastroesophageal reflux disease)   . Chronic neck pain    Past Surgical History  Procedure Laterality Date  . Abdominal hysterectomy    . Bladder surgery    . Breast surgery    . Hemorrhoid surgery    . Carpal tunnel release    . Neck surgery    . Shoulder surgery Right    Family History  Problem Relation Age of Onset  . Diabetes Mother   . Diabetes Father   . Diabetes Brother    History  Substance Use Topics  . Smoking status: Current Every Day Smoker    Types: Cigarettes  . Smokeless tobacco: Not on file  . Alcohol Use: No   OB History    Gravida Para Term Preterm AB TAB SAB Ectopic Multiple Living   '1 1 1            '$ Review of Systems  Constitutional: Negative for appetite change and fatigue.  HENT: Negative for congestion, ear discharge and sinus pressure.   Eyes: Negative for discharge.  Respiratory: Positive for cough.   Cardiovascular: Negative for chest pain.  Gastrointestinal: Negative for abdominal pain and diarrhea.  Genitourinary:  Negative for frequency and hematuria.  Musculoskeletal: Negative for back pain.  Skin: Negative for rash.  Neurological: Negative for seizures and headaches.  Psychiatric/Behavioral: Negative for hallucinations.      Allergies  Review of patient's allergies indicates no known allergies.  Home Medications   Prior to Admission medications   Medication Sig Start Date End Date Taking? Authorizing Provider  albuterol (PROAIR HFA) 108 (90 BASE) MCG/ACT inhaler Inhale 2 puffs into the lungs every 6 (six) hours as needed for shortness of breath.    Yes Historical Provider, MD  albuterol (PROVENTIL) (2.5 MG/3ML) 0.083% nebulizer solution Take 2.5 mg by nebulization every 6 (six) hours as needed for shortness of breath (Mixed with Ipratropium).    Yes Historical Provider, MD  ALPRAZolam Duanne Moron) 1 MG tablet Take 1 mg by mouth 4 (four) times daily as needed for anxiety.    Yes Historical Provider, MD  Aspirin-Acetaminophen-Caffeine (GOODY HEADACHE PO) Take 1 Package by mouth every 4 (four) hours as needed (Pain).   Yes Historical Provider, MD  donepezil (ARICEPT) 10 MG tablet Take 10 mg by mouth at bedtime.  11/04/14  Yes Historical Provider, MD  donepezil (ARICEPT) 5 MG tablet Take 5 mg by mouth every morning.   Yes Historical Provider, MD  DULERA 100-5 MCG/ACT AERO  Take 2 puffs by mouth 2 (two) times daily. 11/04/14  Yes Historical Provider, MD  esomeprazole (NEXIUM) 40 MG capsule Take 40 mg by mouth daily. 11/04/14  Yes Historical Provider, MD  furosemide (LASIX) 20 MG tablet Take 20 mg by mouth every morning.    Yes Historical Provider, MD  guaiFENesin (MUCINEX) 600 MG 12 hr tablet Take 600 mg by mouth daily.  05/22/13  Yes Lezlie Octave Black, NP  ipratropium (ATROVENT) 0.02 % nebulizer solution Take 500 mcg by nebulization every 6 (six) hours as needed for wheezing (*To be mixed with Albuterol for treatments via nebulization*).   Yes Historical Provider, MD  lisinopril (PRINIVIL,ZESTRIL) 10 MG tablet Take  10 mg by mouth daily.   Yes Historical Provider, MD  loratadine (CLARITIN) 10 MG tablet Take 10 mg by mouth every morning.    Yes Historical Provider, MD  meloxicam (MOBIC) 7.5 MG tablet Take 7.5 mg by mouth every morning.   Yes Historical Provider, MD  omeprazole (PRILOSEC) 20 MG capsule Take 20 mg by mouth daily.    Yes Historical Provider, MD  Oxycodone HCl 20 MG TABS Take 20 mg by mouth 4 (four) times daily as needed (for pain).  11/04/14  Yes Historical Provider, MD  PARoxetine (PAXIL) 40 MG tablet Take 40 mg by mouth every morning.   Yes Historical Provider, MD  predniSONE (DELTASONE) 5 MG tablet Take 5-30 mg by mouth See admin instructions. Starting on 01/06/2015 take 6,5,4,3,2,1, then STOP   Yes Historical Provider, MD  [facility] HANDIHALER 18 MCG inhalation capsule Take 18 mcg by mouth daily.  11/04/14  Yes Historical Provider, MD  azithromycin (ZITHROMAX Z-PAK) 250 MG tablet 2 po day one, then 1 daily x 4 days 01/07/15   Milton Ferguson, MD  potassium chloride SA (K-DUR,KLOR-CON) 20 MEQ tablet Take 1 tablet (20 mEq total) by mouth daily. 01/07/15   Milton Ferguson, MD  predniSONE (DELTASONE) 20 MG tablet 2 tabs po daily x 4 days Patient not taking: Reported on 01/07/2015 11/25/14   Elnora Morrison, MD   BP 137/73 mmHg  Pulse 90  Temp(Src) 98.9 F (37.2 C) (Oral)  Resp 16  Ht '5\' 6"'$  (1.676 m)  Wt 152 lb (68.947 kg)  BMI 24.55 kg/m2  SpO2 97% Physical Exam  Constitutional: She is oriented to person, place, and time. She appears well-developed.  HENT:  Head: Normocephalic.  Eyes: Conjunctivae and EOM are normal. No scleral icterus.  Neck: Neck supple. No thyromegaly present.  Cardiovascular: Normal rate and regular rhythm.  Exam reveals no gallop and no friction rub.   No murmur heard. Pulmonary/Chest: No stridor. She has wheezes. She has no rales. She exhibits no tenderness.  Abdominal: She exhibits no distension. There is no tenderness. There is no rebound.  Musculoskeletal: Normal range of  motion. She exhibits no edema.  Lymphadenopathy:    She has no cervical adenopathy.  Neurological: She is oriented to person, place, and time. She exhibits normal muscle tone. Coordination normal.  Skin: No rash noted. No erythema.  Psychiatric: She has a normal mood and affect. Her behavior is normal.    ED Course  Procedures (including critical care time) Labs Review Labs Reviewed  CBC - Abnormal; Notable for the following:    WBC 13.7 (*)    Hemoglobin 11.7 (*)    All other components within normal limits  BASIC METABOLIC PANEL - Abnormal; Notable for the following:    Sodium 133 (*)    Potassium 2.9 (*)  Chloride 88 (*)    CO2 36 (*)    BUN 5 (*)    GFR calc non Af Amer 73 (*)    GFR calc Af Amer 85 (*)    All other components within normal limits  HEPATIC FUNCTION PANEL - Abnormal; Notable for the following:    Albumin 3.2 (*)    Indirect Bilirubin 0.2 (*)    All other components within normal limits  BRAIN NATRIURETIC PEPTIDE  TROPONIN I    Imaging Review Dg Chest 2 View  01/07/2015   CLINICAL DATA:  One day history of chest pain.  Shortness of Breath.  EXAM: CHEST  2 VIEW  COMPARISON:  November 24, 2014  FINDINGS: There is underlying emphysema. There is stable scarring in the right base. There is no edema or consolidation. The heart size is normal. The pulmonary vascularity reflects the underlying emphysema and is stable. No adenopathy. There is postoperative change in the lower cervical spine region.  IMPRESSION: Underlying emphysema. Scarring right base. No edema or consolidation.   Electronically Signed   By: Lowella Grip III M.D.   On: 01/07/2015 18:01     EKG Interpretation   Date/Time:  Tuesday January 07 2015 16:52:13 EDT Ventricular Rate:  89 PR Interval:  164 QRS Duration: 86 QT Interval:  410 QTC Calculation: 498 R Axis:   65 Text Interpretation:  Normal sinus rhythm Prolonged QT Abnormal ECG  Confirmed by Bahar Shelden  MD, Andriy Sherk [id number]) on 01/07/2015  5:57:40 PM      MDM   Final diagnoses:  Bronchitis  Hypokalemia    Bronchitis and hypokalemia,  tx with zpak and potassium and follow up with pcp    Milton Ferguson, MD 01/07/15 2008

## 2015-01-07 NOTE — Discharge Instructions (Signed)
Follow up with your md in 2-3 days

## 2015-01-24 ENCOUNTER — Ambulatory Visit: Payer: Medicare HMO | Admitting: Gastroenterology

## 2015-01-31 ENCOUNTER — Ambulatory Visit: Payer: Medicare HMO | Admitting: Gastroenterology

## 2015-02-18 ENCOUNTER — Other Ambulatory Visit: Payer: Self-pay

## 2015-02-18 ENCOUNTER — Encounter: Payer: Self-pay | Admitting: Gastroenterology

## 2015-02-18 ENCOUNTER — Ambulatory Visit (INDEPENDENT_AMBULATORY_CARE_PROVIDER_SITE_OTHER): Payer: Medicare HMO | Admitting: Gastroenterology

## 2015-02-18 ENCOUNTER — Encounter (INDEPENDENT_AMBULATORY_CARE_PROVIDER_SITE_OTHER): Payer: Self-pay

## 2015-02-18 VITALS — BP 127/70 | HR 91 | Temp 98.6°F | Ht 66.0 in | Wt 148.0 lb

## 2015-02-18 DIAGNOSIS — R1314 Dysphagia, pharyngoesophageal phase: Secondary | ICD-10-CM | POA: Diagnosis not present

## 2015-02-18 NOTE — Progress Notes (Signed)
Primary Care Physician:  Terald Sleeper, PA-C Primary Gastroenterologist:  Dr. Gala Romney   Chief Complaint  Patient presents with  . Gastrophageal Reflux  . Dysphagia    HPI:   Olivia Randall is a 64 y.o. female presenting today at the request of the ED secondary to dysphagia and GERD. Last EGD in 2009 by Dr Laural Golden with small sliding hiatal hernia with mild reflux esophagitis, empiric dilation with 54/56 F, nonerosive antral gastritis. Notes solid food and liquid dysphagia, stopping mid-chest, painful. Takes 30 minutes to eat a hamburger. Edentulous lower palate. No weight loss. Hard to burp. Feels like food sits in upper abdomen, like if she could burp it would go away. Occasional nausea. TAKES GOODY POWDERS. Feels like tongue is blistered. NO prior colonoscopy. Low-volume hematochezia intermittently. Correctol for constipation. Desires to wait on colonoscopy until after upper GI symptoms assessed.   Past Medical History  Diagnosis Date  . COPD (chronic obstructive pulmonary disease)   . Hypertension   . Sleep apnea   . Depression   . On home O2     2L N/C  . Chronic back pain   . Anxiety and depression   . GERD (gastroesophageal reflux disease)   . Chronic neck pain   . Dementia     possible early onset Alzheimer's  . MS (multiple sclerosis)     Past Surgical History  Procedure Laterality Date  . Abdominal hysterectomy    . Bladder surgery    . Breast surgery    . Hemorrhoid surgery    . Carpal tunnel release    . Neck surgery    . Shoulder surgery Right     Current Outpatient Prescriptions  Medication Sig Dispense Refill  . albuterol (PROAIR HFA) 108 (90 BASE) MCG/ACT inhaler Inhale 2 puffs into the lungs every 6 (six) hours as needed for shortness of breath.     [name] albuterol (PROVENTIL) (2.5 MG/3ML) 0.083% nebulizer solution Take 2.5 mg by nebulization every 6 (six) hours as needed for shortness of breath (Mixed with Ipratropium).     . ALPRAZolam (XANAX) 1 MG tablet  Take 1 mg by mouth 4 (four) times daily as needed for anxiety.     . Aspirin-Acetaminophen-Caffeine (GOODY HEADACHE PO) Take 1 Package by mouth every 4 (four) hours as needed (Pain).    [name] clopidogrel (PLAVIX) 75 MG tablet     . donepezil (ARICEPT) 10 MG tablet Take 10 mg by mouth at bedtime.     . DULERA 100-5 MCG/ACT AERO Take 2 puffs by mouth 2 (two) times daily.    [name] esomeprazole (NEXIUM) 40 MG capsule Take 40 mg by mouth daily.    . furosemide (LASIX) 20 MG tablet Take 20 mg by mouth every morning.     [name] ipratropium (ATROVENT) 0.02 % nebulizer solution Take 500 mcg by nebulization every 6 (six) hours as needed for wheezing (*To be mixed with Albuterol for treatments via nebulization*).    [name] lisinopril (PRINIVIL,ZESTRIL) 10 MG tablet Take 10 mg by mouth daily.    [name] loratadine (CLARITIN) 10 MG tablet Take 10 mg by mouth every morning.     . meloxicam (MOBIC) 7.5 MG tablet Take 7.5 mg by mouth every morning.    . Oxycodone HCl 20 MG TABS Take 20 mg by mouth 4 (four) times daily as needed (for pain).     [name] PARoxetine (PAXIL) 40 MG tablet Take 40 mg by mouth every morning.    [name] [facility] HANDIHALER  18 MCG inhalation capsule Take 18 mcg by mouth daily.      No current facility-administered medications for this visit.    Allergies as of 02/18/2015  . (No Known Allergies)    Family History  Problem Relation Age of Onset  . Diabetes Mother   . Diabetes Father   . Diabetes Brother   . Colon cancer Neg Hx     History   Social History  . Marital Status: Married    Spouse Name: N/A  . Number of Children: N/A  . Years of Education: N/A   Occupational History  . Not on file.   Social History Main Topics  . Smoking status: Current Every Day Smoker    Types: Cigarettes  . Smokeless tobacco: Not on file     Comment: 3-4 cigarettes a day  . Alcohol Use: No  . Drug Use: No  . Sexual Activity: No   Other Topics Concern  . Not on file   Social History Narrative    Review of  Systems: Gen: Denies any fever, chills, fatigue, weight loss, lack of appetite.  CV: Denies chest pain, heart palpitations, peripheral edema, syncope.  Resp: +SOB at rest GI: see HPI GU : Denies urinary burning, urinary frequency, urinary hesitancy MS: +joint pain, +muscle spasms Derm: Denies rash, itching, dry skin Psych: Denies depression, anxiety, memory loss, and confusion Heme: Denies bruising, bleeding, and enlarged lymph nodes.  Physical Exam: BP 127/70 mmHg  Pulse 91  Temp(Src) 98.6 F (37 C)  Ht '5\' 6"'$  (1.676 m)  Wt 148 lb (67.132 kg)  BMI 23.90 kg/m2 General:   Alert and oriented. Pleasant and cooperative. Well-nourished and well-developed.  Head:  Normocephalic and atraumatic. Eyes:  Without icterus, sclera clear and conjunctiva pink.  Ears:  Mild HOH Nose:  No deformity, discharge,  or lesions. Mouth:  No deformity or lesions, oral mucosa pink.  Lungs:  Mild inspiratory wheezing bilaterally Heart:  S1, S2 present without murmurs appreciated.  Abdomen:  +BS, soft, non-tender and non-distended. No HSM noted. No guarding or rebound. No masses appreciated.  Rectal:  Deferred  Msk:  Symmetrical without gross deformities. Normal posture. Neurologic:  Alert and  oriented x4;  grossly normal neurologically. Skin:  Intact without significant lesions or rashes. Psych:  Alert and cooperative. Normal mood and affect.

## 2015-02-18 NOTE — Patient Instructions (Signed)
We have scheduled you for an upper endoscopy and dilation with Dr. Gala Romney in the near future. You will need a colonoscopy in the future once we address your upper gastrointestinal symptoms.

## 2015-02-21 ENCOUNTER — Telehealth: Payer: Self-pay | Admitting: Gastroenterology

## 2015-02-21 ENCOUNTER — Encounter: Payer: Self-pay | Admitting: Gastroenterology

## 2015-02-21 NOTE — Telephone Encounter (Signed)
I was able to get records from [facility] and they are in your box.

## 2015-02-21 NOTE — Telephone Encounter (Signed)
You had asked for me to check with [facility] medical records to get the EGD/path report that NUR did on patient, but Trish in medical records said she has checked the paper charts, disk system and archives and there are no records of this on her.

## 2015-02-24 ENCOUNTER — Telehealth: Payer: Self-pay | Admitting: Gastroenterology

## 2015-02-24 NOTE — Telephone Encounter (Signed)
Please have patient return after EGD with Dr. Gala Romney to assess symptoms and set up screening colonoscopy.

## 2015-02-24 NOTE — Assessment & Plan Note (Signed)
64 year old female with solid food and liquid dysphagia, epigastric discomfort with eating, occasional nausea with last EGD in 2009 noting small sliding hiatal hernia, mild reflux esophagitis, empiric dilation, non-erosive antral gastritis (Dr. Laural Golden). Goody powders routinely. Query significant esophagitis, web, ring, stricture as culprit for dysphagia. Dyspepsia may be secondary to aspirin powders leading to gastritis, PUD. Remains on Nexium. Needs EGD with dilation for further evaluation. Patient is on Plavix. As of note, low-volume hematochezia in the setting of chronic constipation, likely benign anorectal source. No prior colonoscopy. Patient does not feel she would be able to tolerate the prep until after upper GI symptoms are resolved. Will proceed with EGD and have her follow-up after procedure to set this up.   Proceed with upper endoscopy and dilation in the near future with Dr. Gala Romney. The risks, benefits, and alternatives have been discussed in detail with patient. They have stated understanding and desire to proceed.  Propofol due to polypharmacy Return as outpatient to arrange elective colonoscopy

## 2015-02-25 NOTE — Telephone Encounter (Signed)
Patient is scheduled 7/18 at 230

## 2015-02-26 NOTE — Progress Notes (Signed)
cc'd to pcp 

## 2015-03-14 ENCOUNTER — Encounter (HOSPITAL_COMMUNITY)
Admission: RE | Admit: 2015-03-14 | Discharge: 2015-03-14 | Disposition: A | Discharge status: Home / Self Care | Payer: Medicare HMO | Source: Ambulatory Visit | Attending: Internal Medicine | Admitting: Internal Medicine

## 2015-03-14 NOTE — Patient Instructions (Signed)
[place]  03/14/2015     '@PREFPERIOPPHARMACY'$ @   Your procedure is scheduled on  03/20/2015  Report to [facility] at  38  A.M.  Call this number if you have problems the morning of surgery:  [phone]   Remember:  Do not eat food or drink liquids after midnight.  Take these medicines the morning of surgery with A SIP OF WATER  Xanax, aricept, nexium, lisinopril, oxycodone, mobic, paxil. Take all your inhalers and your nebulizer before you come.   Do not wear jewelry, make-up or nail polish.  Do not wear lotions, powders, or perfumes.    Do not shave 48 hours prior to surgery.  Men may shave face and neck.  Do not bring valuables to the hospital.  [facility] is not responsible for any belongings or valuables.  Contacts, dentures or bridgework may not be worn into surgery.  Leave your suitcase in the car.  After surgery it may be brought to your room.  For patients admitted to the hospital, discharge time will be determined by your treatment team.  Patients discharged the day of surgery will not be allowed to drive home.   Name and phone number of your driver:  family Special instructions:  none  Please read over the following fact sheets that you were given. Pain Booklet, Blood Transfusion Information, Surgical Site Infection Prevention, Anesthesia Post-op Instructions and Care and Recovery After Surgery      Esophagogastroduodenoscopy Esophagogastroduodenoscopy (EGD) is a procedure to examine the lining of the esophagus, stomach, and first part of the small intestine (duodenum). A long, flexible, lighted tube with a camera attached (endoscope) is inserted down the throat to view these organs. This procedure is done to detect problems or abnormalities, such as inflammation, bleeding, ulcers, or growths, in order to treat them. The procedure lasts about 5-20 minutes. It is usually an outpatient procedure, but it may need to be performed in emergency cases in  the hospital. LET YOUR CAREGIVER KNOW ABOUT:   Allergies to food or medicine.  All medicines you are taking, including vitamins, herbs, eyedrops, and over-the-counter medicines and creams.  Use of steroids (by mouth or creams).  Previous problems you or members of your family have had with the use of anesthetics.  Any blood disorders you have.  Previous surgeries you have had.  Other health problems you have.  Possibility of pregnancy, if this applies. RISKS AND COMPLICATIONS  Generally, EGD is a safe procedure. However, as with any procedure, complications can occur. Possible complications include:  Infection.  Bleeding.  Tearing (perforation) of the esophagus, stomach, or duodenum.  Difficulty breathing or not being able to breath.  Excessive sweating.  Spasms of the larynx.  Slowed heartbeat.  Low blood pressure. BEFORE THE PROCEDURE  Do not eat or drink anything for 6-8 hours before the procedure or as directed by your caregiver.  Ask your caregiver about changing or stopping your regular medicines.  If you wear dentures, be prepared to remove them before the procedure.  Arrange for someone to drive you home after the procedure. PROCEDURE   A vein will be accessed to give medicines and fluids. A medicine to relax you (sedative) and a pain reliever will be given through that access into the vein.  A numbing medicine (local anesthetic) may be sprayed on your throat for comfort and to stop you from gagging or coughing.  A mouth guard may be placed in  your mouth to protect your teeth and to keep you from biting on the endoscope.  You will be asked to lie on your left side.  The endoscope is inserted down your throat and into the esophagus, stomach, and duodenum.  Air is put through the endoscope to allow your caregiver to view the lining of your esophagus clearly.  The esophagus, stomach, and duodenum is then examined. During the exam, your caregiver  may:  Remove tissue to be examined under a microscope (biopsy) for inflammation, infection, or other medical problems.  Remove growths.  Remove objects (foreign bodies) that are stuck.  Treat any bleeding with medicines or other devices that stop tissues from bleeding (hot cautery, clipping devices).  Widen (dilate) or stretch narrowed areas of the esophagus and stomach.  The endoscope will then be withdrawn. AFTER THE PROCEDURE  You will be taken to a recovery area to be monitored. You will be able to go home once you are stable and alert.  Do not eat or drink anything until the local anesthetic and numbing medicines have worn off. You may choke.  It is normal to feel bloated, have pain with swallowing, or have a sore throat for a short time. This will wear off.  Your caregiver should be able to discuss his or her findings with you. It will take longer to discuss the test results if any biopsies were taken. Document Released: 12/31/2004 Document Revised: 01/14/2014 Document Reviewed: 08/02/2012 [facility] Patient Information 2015 [place], [place]. This information is not intended to replace advice given to you by your health care provider. Make sure you discuss any questions you have with your health care provider. Esophageal Dilatation The esophagus is the long, narrow tube which carries food and liquid from the mouth to the stomach. Esophageal dilatation is the technique used to stretch a blocked or narrowed portion of the esophagus. This procedure is used when a part of the esophagus has become so narrow that it becomes difficult, painful or even impossible to swallow. This is generally an uncomplicated form of treatment. When this is not successful, chest surgery may be required. This is a much more extensive form of treatment with a longer recovery time. CAUSES  Some of the more common causes of blockage or strictures of the esophagus are:  Narrowing from longstanding inflammation  (soreness and redness) of the lower esophagus. This comes from the constant exposure of the lower esophagus to the acid which bubbles up from the stomach. Over time this causes scarring and narrowing of the lower esophagus.  Hiatal hernia in which a small part of the stomach bulges (herniates) up through the diaphragm. This can cause a gradual narrowing of the end of the esophagus.  Schatzki ring is a narrow ring of benign (non-cancerous) fibrous tissue which constricts the lower esophagus. The reason for this is not known.  Scleroderma is a connective tissue disorder that affects the esophagus and makes swallowing difficult.  Achalasia is an absence of nerves to the lower esophagus and to the esophageal sphincter. This is the circular muscle between the stomach and esophagus that relaxes to allow food into the stomach. After swallowing, it contracts to keep food in the stomach. This absence of nerves may be congenital (present since birth). This can cause irregular spasms of the lower esophageal muscle. This spasm does not open up to allow food and fluid through. The result is a persistent blockage with subsequent slow trickling of the esophageal contents into the stomach.  Strictures may  develop from swallowing materials which damage the esophagus. Some examples are strong acids or alkalis such as lye.  Growths such as benign (non-cancerous) and malignant (cancerous) tumors can block the esophagus.  Hereditary (present since birth) causes. DIAGNOSIS  Your caregiver often suspects this problem by taking a medical history. They will also do a physical exam. They can then prove their suspicions using X-rays and endoscopy. Endoscopy is an exam in which a tube like a small, flexible telescope is used to look at your esophagus.  TREATMENT There are different stretching (dilating) techniques that can be used. Simple bougie dilatation may be done in the office. This usually takes only a couple minutes. A  numbing (anesthetic) spray of the throat is used. Endoscopy, when done, is done in an endoscopy suite under mild sedation. When fluoroscopy is used, the procedure is performed in X-ray. Other techniques require a little longer time. Recovery is usually quick. There is no waiting time to begin eating and drinking to test success of the treatment. Following are some of the methods used. Narrowing of the esophagus is treated by making it bigger. Commonly this is a mechanical problem which can be treated with stretching. This can be done in different ways. Your caregiver will discuss these with you. Some of the means used are:  A series of graduated (increasing thickness) flexible dilators can be used. These are weighted tubes passed through the esophagus into the stomach. The tubes used become progressively larger until the desired stretched size is reached. Graduated dilators are a simple and quick way of opening the esophagus. No visualization is required.  Another method is the use of endoscopy to place a flexible wire across the stricture. The endoscope is removed and the wire left in place. A dilator with a hole through it from end to end is guided down the esophagus and across the stricture. One or more of these dilators are passed over the wire. At the end of the exam, the wire is removed. This type of treatment may be performed in the X-ray department under fluoroscopy. An advantage of this procedure is the examiner is visualizing the end opening in the esophagus.  Stretching of the esophagus may be done using balloons. Deflated balloons are placed through the endoscope and across the stricture. This type of balloon dilatation is often done at the time of endoscopy or fluoroscopy. Flexible endoscopy allows the examiner to directly view the stricture. A balloon is inserted in the deflated form into the area of narrowing. It is then inflated with air to a certain pressure that is preset for a given  circumference. When inflated, it becomes sausage shaped, stretched, and makes the stricture larger.  Achalasia requires a longer, larger balloon-type dilator. This is frequently done under X-ray control. In this situation, the spastic muscle fibers in the lower esophagus are stretched. All of the above procedures make the passage of food and water into the stomach easier. They also make it easier for stomach contents to reflux back into the esophagus. Special medications may be used following the procedure to help prevent further stricturing. Proton-pump inhibitor medications are good at decreasing the amount of acid in the stomach juice. When stomach juice refluxes into the esophagus, the juice is no longer as acidic and is less likely to burn or scar the esophagus. RISKS AND COMPLICATIONS Esophageal dilatation is usually performed effectively and without problems. Some complications that can occur are:  A small amount of bleeding almost always happens where the  stretching takes place. If this is too excessive it may require more aggressive treatment.  An uncommon complication is perforation (making a hole) of the esophagus. The esophagus is thin. It is easy to make a hole in it. If this happens, an operation may be necessary to repair this.  A small, undetected perforation could lead to an infection in the chest. This can be very serious. HOME CARE INSTRUCTIONS   If you received sedation for your procedure, do not drive, make important decisions, or perform any activities requiring your full coordination. Do not drink alcohol, take sedatives, or use any mind altering chemicals unless instructed by your caregiver.  You may use throat lozenges or warm salt water gargles if you have throat discomfort.  You can begin eating and drinking normally on return home unless instructed otherwise. Do not purposely try to force large chunks of food down to test the benefits of your procedure.  Mild  discomfort can be eased with sips of ice water.  Medications for discomfort may or may not be needed. SEEK IMMEDIATE MEDICAL CARE IF:   You begin vomiting up blood.  You develop black, tarry stools.  You develop chills or an unexplained temperature of over 101F (38.3C)  You develop chest or abdominal pain.  You develop shortness of breath, or feel light-headed or faint.  Your swallowing is becoming more painful, difficult, or you are unable to swallow. MAKE SURE YOU:   Understand these instructions.  Will watch your condition.  Will get help right away if you are not doing well or get worse. Document Released: 10/21/2005 Document Revised: 01/14/2014 Document Reviewed: 12/08/2005 [facility] Patient Information 2015 [place], [place]. This information is not intended to replace advice given to you by your health care provider. Make sure you discuss any questions you have with your health care provider. PATIENT INSTRUCTIONS POST-ANESTHESIA  IMMEDIATELY FOLLOWING SURGERY:  Do not drive or operate machinery for the first twenty four hours after surgery.  Do not make any important decisions for twenty four hours after surgery or while taking narcotic pain medications or sedatives.  If you develop intractable nausea and vomiting or a severe headache please notify your doctor immediately.  FOLLOW-UP:  Please make an appointment with your surgeon as instructed. You do not need to follow up with anesthesia unless specifically instructed to do so.  WOUND CARE INSTRUCTIONS (if applicable):  Keep a dry clean dressing on the anesthesia/puncture wound site if there is drainage.  Once the wound has quit draining you may leave it open to air.  Generally you should leave the bandage intact for twenty four hours unless there is drainage.  If the epidural site drains for more than 36-48 hours please call the anesthesia department.  QUESTIONS?:  Please feel free to call your physician or the hospital  operator if you have any questions, and they will be happy to assist you.

## 2015-03-17 ENCOUNTER — Inpatient Hospital Stay (HOSPITAL_COMMUNITY)
Admission: EM | Admit: 2015-03-17 | Discharge: 2015-03-20 | DRG: 190 | Disposition: A | Discharge status: Home Health | Payer: Medicare HMO | Attending: Internal Medicine | Admitting: Internal Medicine

## 2015-03-17 ENCOUNTER — Emergency Department (HOSPITAL_COMMUNITY): Payer: Medicare HMO

## 2015-03-17 ENCOUNTER — Encounter (HOSPITAL_COMMUNITY): Payer: Self-pay | Admitting: Emergency Medicine

## 2015-03-17 DIAGNOSIS — Z833 Family history of diabetes mellitus: Secondary | ICD-10-CM

## 2015-03-17 DIAGNOSIS — R197 Diarrhea, unspecified: Secondary | ICD-10-CM | POA: Diagnosis present

## 2015-03-17 DIAGNOSIS — J441 Chronic obstructive pulmonary disease with (acute) exacerbation: Principal | ICD-10-CM | POA: Diagnosis present

## 2015-03-17 DIAGNOSIS — F17200 Nicotine dependence, unspecified, uncomplicated: Secondary | ICD-10-CM | POA: Diagnosis present

## 2015-03-17 DIAGNOSIS — F329 Major depressive disorder, single episode, unspecified: Secondary | ICD-10-CM | POA: Diagnosis present

## 2015-03-17 DIAGNOSIS — F419 Anxiety disorder, unspecified: Secondary | ICD-10-CM | POA: Diagnosis present

## 2015-03-17 DIAGNOSIS — Z9981 Dependence on supplemental oxygen: Secondary | ICD-10-CM | POA: Diagnosis not present

## 2015-03-17 DIAGNOSIS — G4730 Sleep apnea, unspecified: Secondary | ICD-10-CM | POA: Diagnosis present

## 2015-03-17 DIAGNOSIS — F17210 Nicotine dependence, cigarettes, uncomplicated: Secondary | ICD-10-CM | POA: Diagnosis present

## 2015-03-17 DIAGNOSIS — R0902 Hypoxemia: Secondary | ICD-10-CM

## 2015-03-17 DIAGNOSIS — J9620 Acute and chronic respiratory failure, unspecified whether with hypoxia or hypercapnia: Secondary | ICD-10-CM | POA: Diagnosis present

## 2015-03-17 DIAGNOSIS — E871 Hypo-osmolality and hyponatremia: Secondary | ICD-10-CM | POA: Diagnosis present

## 2015-03-17 DIAGNOSIS — J9621 Acute and chronic respiratory failure with hypoxia: Secondary | ICD-10-CM | POA: Diagnosis not present

## 2015-03-17 DIAGNOSIS — G35 Multiple sclerosis: Secondary | ICD-10-CM | POA: Diagnosis present

## 2015-03-17 DIAGNOSIS — R1314 Dysphagia, pharyngoesophageal phase: Secondary | ICD-10-CM | POA: Diagnosis present

## 2015-03-17 DIAGNOSIS — F0390 Unspecified dementia without behavioral disturbance: Secondary | ICD-10-CM | POA: Diagnosis present

## 2015-03-17 DIAGNOSIS — F32A Depression, unspecified: Secondary | ICD-10-CM | POA: Diagnosis present

## 2015-03-17 DIAGNOSIS — I10 Essential (primary) hypertension: Secondary | ICD-10-CM | POA: Diagnosis present

## 2015-03-17 DIAGNOSIS — G8929 Other chronic pain: Secondary | ICD-10-CM | POA: Diagnosis present

## 2015-03-17 DIAGNOSIS — R0602 Shortness of breath: Secondary | ICD-10-CM | POA: Diagnosis present

## 2015-03-17 DIAGNOSIS — M549 Dorsalgia, unspecified: Secondary | ICD-10-CM | POA: Diagnosis present

## 2015-03-17 DIAGNOSIS — K219 Gastro-esophageal reflux disease without esophagitis: Secondary | ICD-10-CM | POA: Diagnosis present

## 2015-03-17 LAB — BASIC METABOLIC PANEL
ANION GAP: 9 (ref 5–15)
BUN: 5 mg/dL — AB (ref 6–20)
CALCIUM: 9.3 mg/dL (ref 8.9–10.3)
CO2: 32 mmol/L (ref 22–32)
Chloride: 95 mmol/L — ABNORMAL LOW (ref 101–111)
Creatinine, Ser: 0.88 mg/dL (ref 0.44–1.00)
Glucose, Bld: 99 mg/dL (ref 65–99)
POTASSIUM: 3.6 mmol/L (ref 3.5–5.1)
Sodium: 136 mmol/L (ref 135–145)

## 2015-03-17 LAB — CBC WITH DIFFERENTIAL/PLATELET
BASOS ABS: 0 10*3/uL (ref 0.0–0.1)
BASOS PCT: 1 % (ref 0–1)
Eosinophils Absolute: 0.2 10*3/uL (ref 0.0–0.7)
Eosinophils Relative: 4 % (ref 0–5)
HEMATOCRIT: 38.1 % (ref 36.0–46.0)
Hemoglobin: 11.9 g/dL — ABNORMAL LOW (ref 12.0–15.0)
LYMPHS ABS: 1.4 10*3/uL (ref 0.7–4.0)
LYMPHS PCT: 21 % (ref 12–46)
MCH: 28.1 pg (ref 26.0–34.0)
MCHC: 31.2 g/dL (ref 30.0–36.0)
MCV: 89.9 fL (ref 78.0–100.0)
MONO ABS: 0.5 10*3/uL (ref 0.1–1.0)
MONOS PCT: 7 % (ref 3–12)
Neutro Abs: 4.3 10*3/uL (ref 1.7–7.7)
Neutrophils Relative %: 67 % (ref 43–77)
Platelets: 322 10*3/uL (ref 150–400)
RBC: 4.24 MIL/uL (ref 3.87–5.11)
RDW: 15.5 % (ref 11.5–15.5)
WBC: 6.5 10*3/uL (ref 4.0–10.5)

## 2015-03-17 LAB — CLOSTRIDIUM DIFFICILE BY PCR: Toxigenic C. Difficile by PCR: NEGATIVE

## 2015-03-17 MED ORDER — IPRATROPIUM BROMIDE 0.02 % IN SOLN
1.0000 mg | Freq: Once | RESPIRATORY_TRACT | Status: AC
  Administered 2015-03-17: 1 mg via RESPIRATORY_TRACT
  Filled 2015-03-17: qty 5

## 2015-03-17 MED ORDER — ACETAMINOPHEN 650 MG RE SUPP: 650.0000 mg | Freq: Four times a day (QID) | RECTAL | Status: DC | PRN

## 2015-03-17 MED ORDER — ALBUTEROL (5 MG/ML) CONTINUOUS INHALATION SOLN
10.0000 mg/h | INHALATION_SOLUTION | Freq: Once | RESPIRATORY_TRACT | Status: AC
  Administered 2015-03-17: 10 mg/h via RESPIRATORY_TRACT
  Filled 2015-03-17: qty 20

## 2015-03-17 MED ORDER — ACETAMINOPHEN 325 MG PO TABS
650.0000 mg | ORAL_TABLET | Freq: Four times a day (QID) | ORAL | Status: DC | PRN
  Administered 2015-03-17: 650 mg via ORAL
  Filled 2015-03-17: qty 2

## 2015-03-17 MED ORDER — METHYLPREDNISOLONE SODIUM SUCC 125 MG IJ SOLR
125.0000 mg | Freq: Once | INTRAMUSCULAR | Status: AC
  Administered 2015-03-17: 125 mg via INTRAVENOUS
  Filled 2015-03-17: qty 2

## 2015-03-17 NOTE — ED Notes (Signed)
Patient medicated for headache. Rated it as a 7

## 2015-03-17 NOTE — ED Notes (Signed)
Pt reports SOB with productive cough since last night.

## 2015-03-17 NOTE — ED Provider Notes (Signed)
CSN: [id number]     Arrival date & time 03/17/15  1646 History   First MD Initiated Contact with Patient 03/17/15 1653     Chief Complaint  Patient presents with  . Shortness of Breath      HPI Pt was seen at 1705.  Per pt, c/o gradual onset and worsening of persistent cough, wheezing and SOB since yesterday, worse today.  Describes her symptoms as "my COPD is acting up."  Has been using home O2 N/C, MDI and nebs without relief.  Denies CP/palpitations, no back pain, no abd pain, no N/V/D, no fevers, no rash.     Past Medical History  Diagnosis Date  . COPD (chronic obstructive pulmonary disease)   . Hypertension   . Sleep apnea   . Depression   . On home O2     2L N/C  . Chronic back pain   . Anxiety and depression   . GERD (gastroesophageal reflux disease)   . Chronic neck pain   . Dementia     possible early onset Alzheimer's  . MS (multiple sclerosis)    Past Surgical History  Procedure Laterality Date  . Abdominal hysterectomy    . Bladder surgery    . Breast surgery    . Hemorrhoid surgery    . Carpal tunnel release    . Neck surgery    . Shoulder surgery Right   . Esophagogastroduodenoscopy  2009    Dr. Laural Golden: small sliding hiatal hernia with mild reflux esophagitis, empiric dilation with 54/56 F, nonerosive antral gastritis    Family History  Problem Relation Age of Onset  . Diabetes Mother   . Diabetes Father   . Diabetes Brother   . Colon cancer Neg Hx    History  Substance Use Topics  . Smoking status: Current Every Day Smoker    Types: Cigarettes  . Smokeless tobacco: Not on file     Comment: 3-4 cigarettes a day  . Alcohol Use: No   OB History    Gravida Para Term Preterm AB TAB SAB Ectopic Multiple Living   '1 1 1            '$ Review of Systems ROS: Statement: All systems negative except as marked or noted in the HPI; Constitutional: Negative for fever and chills. ; ; Eyes: Negative for eye pain, redness and discharge. ; ; ENMT: Negative for  ear pain, hoarseness, nasal congestion, sinus pressure and sore throat. ; ; Cardiovascular: Negative for chest pain, palpitations, diaphoresis, and peripheral edema. ; ; Respiratory: +cough, wheezing, SOB. Negative for stridor. ; ; Gastrointestinal: Negative for nausea, vomiting, diarrhea, abdominal pain, blood in stool, hematemesis, jaundice and rectal bleeding. . ; ; Genitourinary: Negative for dysuria, flank pain and hematuria. ; ; Musculoskeletal: Negative for back pain and neck pain. Negative for swelling and trauma.; ; Skin: Negative for pruritus, rash, abrasions, blisters, bruising and skin lesion.; ; Neuro: Negative for headache, lightheadedness and neck stiffness. Negative for weakness, altered level of consciousness , altered mental status, extremity weakness, paresthesias, involuntary movement, seizure and syncope.      Allergies  Review of patient's allergies indicates no known allergies.  Home Medications   Prior to Admission medications   Medication Sig Start Date End Date Taking? Authorizing Provider  albuterol (PROAIR HFA) 108 (90 BASE) MCG/ACT inhaler Inhale 2 puffs into the lungs every 6 (six) hours as needed for shortness of breath.    Yes Historical Provider, MD  albuterol (PROVENTIL) (2.5 MG/3ML) 0.083%  nebulizer solution Take 2.5 mg by nebulization every 6 (six) hours as needed for shortness of breath (Mixed with Ipratropium).    Yes Historical Provider, MD  ALPRAZolam Duanne Moron) 1 MG tablet Take 1 mg by mouth 4 (four) times daily as needed for anxiety.    Yes Historical Provider, MD  Aspirin-Acetaminophen-Caffeine (GOODY HEADACHE PO) Take 1 Package by mouth every 4 (four) hours as needed (Pain).   Yes Historical Provider, MD  clopidogrel (PLAVIX) 75 MG tablet Take 75 mg by mouth daily.  01/25/15  Yes Historical Provider, MD  donepezil (ARICEPT) 10 MG tablet Take 10 mg by mouth at bedtime.  11/04/14  Yes Historical Provider, MD  DULERA 100-5 MCG/ACT AERO Take 2 puffs by mouth 2  (two) times daily. 11/04/14  Yes Historical Provider, MD  esomeprazole (NEXIUM) 40 MG capsule Take 40 mg by mouth daily. 11/04/14  Yes Historical Provider, MD  furosemide (LASIX) 20 MG tablet Take 20 mg by mouth every morning.    Yes Historical Provider, MD  ipratropium (ATROVENT) 0.02 % nebulizer solution Take 500 mcg by nebulization every 6 (six) hours as needed for wheezing (*To be mixed with Albuterol for treatments via nebulization*).   Yes Historical Provider, MD  lisinopril (PRINIVIL,ZESTRIL) 10 MG tablet Take 10 mg by mouth daily.   Yes Historical Provider, MD  loratadine (CLARITIN) 10 MG tablet Take 10 mg by mouth every morning.    Yes Historical Provider, MD  meloxicam (MOBIC) 7.5 MG tablet Take 7.5 mg by mouth every morning.   Yes Historical Provider, MD  Oxycodone HCl 20 MG TABS Take 20 mg by mouth 4 (four) times daily as needed (for pain).  11/04/14  Yes Historical Provider, MD  PARoxetine (PAXIL) 40 MG tablet Take 40 mg by mouth every morning.   Yes Historical Provider, MD  potassium chloride (K-DUR) 10 MEQ tablet Take 1 tablet by mouth daily. 02/22/15  Yes Historical Provider, MD  SPIRIVA HANDIHALER 18 MCG inhalation capsule Take 18 mcg by mouth daily.  11/04/14  Yes Historical Provider, MD   BP 146/62 mmHg  Pulse 100  Temp(Src) 98.3 F (36.8 C) (Oral)  Resp 21  Ht '5\' 6"'$  (1.676 m)  Wt 148 lb (67.132 kg)  BMI 23.90 kg/m2  SpO2 99% Physical Exam  1710: Physical examination:  Nursing notes reviewed; Vital signs and O2 SAT reviewed;  Constitutional: Well developed, Well nourished, Well hydrated, Uncomfortable appearing;; Head:  Normocephalic, atraumatic; Eyes: EOMI, PERRL, No scleral icterus; ENMT: Mouth and pharynx normal, Mucous membranes moist; Neck: Supple, Full range of motion, No lymphadenopathy; Cardiovascular: Regular rate and rhythm, No gallop; Respiratory: Breath sounds diminished & equal bilaterally, scattered wheezes. No audible wheezing. Sitting upright, speaking long  phrases. Tachypneic.; Chest: Nontender, Movement normal; Abdomen: Soft, Nontender, Nondistended, Normal bowel sounds; Genitourinary: No CVA tenderness; Extremities: Pulses normal, No tenderness, No edema, No calf edema or asymmetry.; Neuro: AA&Ox3, vague historian. Major CN grossly intact.  Speech clear. No gross focal motor or sensory deficits in extremities.; Skin: Color normal, Warm, Dry.   ED Course  Procedures     EKG Interpretation None      MDM  MDM Reviewed: previous chart, nursing note and vitals Reviewed previous: labs Interpretation: labs and x-ray Total time providing critical care: 30-74 minutes. This excludes time spent performing separately reportable procedures and services. Consults: admitting MD     CRITICAL CARE Performed by: Alfonzo Feller Total critical care time: 35 Critical care time was exclusive of separately billable procedures and treating other patients.  Critical care was necessary to treat or prevent imminent or life-threatening deterioration. Critical care was time spent personally by me on the following activities: development of treatment plan with patient and/or surrogate as well as nursing, discussions with consultants, evaluation of patient's response to treatment, examination of patient, obtaining history from patient or surrogate, ordering and performing treatments and interventions, ordering and review of laboratory studies, ordering and review of radiographic studies, pulse oximetry and re-evaluation of patient's condition.   Results for orders placed or performed during the hospital encounter of 19/41/74  Basic metabolic panel  Result Value Ref Range   Sodium 136 135 - 145 mmol/L   Potassium 3.6 3.5 - 5.1 mmol/L   Chloride 95 (L) 101 - 111 mmol/L   CO2 32 22 - 32 mmol/L   Glucose, Bld 99 65 - 99 mg/dL   BUN 5 (L) 6 - 20 mg/dL   Creatinine, Ser 0.88 0.44 - 1.00 mg/dL   Calcium 9.3 8.9 - 10.3 mg/dL   GFR calc non Af Amer >60 >60  mL/min   GFR calc Af Amer >60 >60 mL/min   Anion gap 9 5 - 15  CBC with Differential  Result Value Ref Range   WBC 6.5 4.0 - 10.5 K/uL   RBC 4.24 3.87 - 5.11 MIL/uL   Hemoglobin 11.9 (L) 12.0 - 15.0 g/dL   HCT 38.1 36.0 - 46.0 %   MCV 89.9 78.0 - 100.0 fL   MCH 28.1 26.0 - 34.0 pg   MCHC 31.2 30.0 - 36.0 g/dL   RDW 15.5 11.5 - 15.5 %   Platelets 322 150 - 400 K/uL   Neutrophils Relative % 67 43 - 77 %   Neutro Abs 4.3 1.7 - 7.7 K/uL   Lymphocytes Relative 21 12 - 46 %   Lymphs Abs 1.4 0.7 - 4.0 K/uL   Monocytes Relative 7 3 - 12 %   Monocytes Absolute 0.5 0.1 - 1.0 K/uL   Eosinophils Relative 4 0 - 5 %   Eosinophils Absolute 0.2 0.0 - 0.7 K/uL   Basophils Relative 1 0 - 1 %   Basophils Absolute 0.0 0.0 - 0.1 K/uL   Dg Chest Port 1 View 03/17/2015   CLINICAL DATA:  Acute onset of shortness of breath and wheezing. Initial encounter.  EXAM: PORTABLE CHEST - 1 VIEW  COMPARISON:  Chest radiograph performed 01/07/2015  FINDINGS: The lungs are well-aerated. Mild right basilar and left-sided atelectasis or scarring are seen, with slight interstitial prominence. There is no evidence of pleural effusion or pneumothorax.  The cardiomediastinal silhouette is within normal limits. No acute osseous abnormalities are seen. Cervical spinal fusion hardware is partially imaged.  IMPRESSION: Mild right basilar and left-sided atelectasis or scarring seen, with slight interstitial prominence. This appears relatively stable from the prior study, and may reflect chronic changes of COPD. No superimposed airspace consolidation seen at this time.   Electronically Signed   By: Garald Balding M.D.   On: 03/17/2015 17:51    1950:  On arrival, pt sitting upright, tachypneic, wheezing: IV solumedrol and hour long neb ordered. Neb completed, lungs diminished, Sats 94-95% on O2 2L N/C at rest. Pt's Sats decrease to 85% O2 N/C while ambulating. Dx and testing d/w pt and family.  Questions answered.  Verb understanding,  agreeable to observation admit.  T/C to [facility] Dr. Darrick Meigs, case discussed, including:  HPI, pertinent PM/SHx, VS/PE, dx testing, ED course and treatment:  Agreeable to admit, requests to write temporary orders, obtain  observation medical bed to team APAdmits.   Francine Graven, DO 03/19/15 1128

## 2015-03-17 NOTE — H&P (Signed)
PCP:   Olivia Sleeper, PA-C   Chief Complaint:  Shortness of breath  HPI: 64 year old female who   has a past medical history of COPD (chronic obstructive pulmonary disease); Hypertension; Sleep apnea; Depression; On home O2; Chronic back pain; Anxiety and depression; GERD (gastroesophageal reflux disease); Chronic neck pain; Dementia; and MS (multiple sclerosis). Today came to the hospital with worsening shortness of breath. Patient has a history of COPD and is on chronic home O2 with 2 L of oxygen via nasal cannula. Patient says that she has been feeling more tight in her chest and nebulizer treatments are not helping at home. Patient also was given antibiotic so week ago for the bronchitis and now she has due to diarrhea. Patient had 5-6 loose bowel movements today. She also complains of dysuria. On ablation patient's O2 sats dropped to 85% on 2 L of oxygen. She denies headache, no fever. She feels bloated but denies abdominal pain.  Allergies:  No Known Allergies    Past Medical History  Diagnosis Date  . COPD (chronic obstructive pulmonary disease)   . Hypertension   . Sleep apnea   . Depression   . On home O2     2L N/C  . Chronic back pain   . Anxiety and depression   . GERD (gastroesophageal reflux disease)   . Chronic neck pain   . Dementia     possible early onset Alzheimer's  . MS (multiple sclerosis)     Past Surgical History  Procedure Laterality Date  . Abdominal hysterectomy    . Bladder surgery    . Breast surgery    . Hemorrhoid surgery    . Carpal tunnel release    . Neck surgery    . Shoulder surgery Right   . Esophagogastroduodenoscopy  2009    Dr. Laural Randall: small sliding hiatal hernia with mild reflux esophagitis, empiric dilation with 54/56 F, nonerosive antral gastritis     Prior to Admission medications   Medication Sig Start Date End Date Taking? Authorizing Provider  albuterol (PROAIR HFA) 108 (90 BASE) MCG/ACT inhaler Inhale 2 puffs into  the lungs every 6 (six) hours as needed for shortness of breath.    Yes Historical Provider, MD  albuterol (PROVENTIL) (2.5 MG/3ML) 0.083% nebulizer solution Take 2.5 mg by nebulization every 6 (six) hours as needed for shortness of breath (Mixed with Ipratropium).    Yes Historical Provider, MD  ALPRAZolam Duanne Moron) 1 MG tablet Take 1 mg by mouth 4 (four) times daily as needed for anxiety.    Yes Historical Provider, MD  Aspirin-Acetaminophen-Caffeine (GOODY HEADACHE PO) Take 1 Package by mouth every 4 (four) hours as needed (Pain).   Yes Historical Provider, MD  clopidogrel (PLAVIX) 75 MG tablet Take 75 mg by mouth daily.  01/25/15  Yes Historical Provider, MD  donepezil (ARICEPT) 10 MG tablet Take 10 mg by mouth at bedtime.  11/04/14  Yes Historical Provider, MD  DULERA 100-5 MCG/ACT AERO Take 2 puffs by mouth 2 (two) times daily. 11/04/14  Yes Historical Provider, MD  esomeprazole (NEXIUM) 40 MG capsule Take 40 mg by mouth daily. 11/04/14  Yes Historical Provider, MD  furosemide (LASIX) 20 MG tablet Take 20 mg by mouth every morning.    Yes Historical Provider, MD  ipratropium (ATROVENT) 0.02 % nebulizer solution Take 500 mcg by nebulization every 6 (six) hours as needed for wheezing (*To be mixed with Albuterol for treatments via nebulization*).   Yes Historical Provider, MD  lisinopril (  PRINIVIL,ZESTRIL) 10 MG tablet Take 10 mg by mouth daily.   Yes Historical Provider, MD  loratadine (CLARITIN) 10 MG tablet Take 10 mg by mouth every morning.    Yes Historical Provider, MD  meloxicam (MOBIC) 7.5 MG tablet Take 7.5 mg by mouth every morning.   Yes Historical Provider, MD  Oxycodone HCl 20 MG TABS Take 20 mg by mouth 4 (four) times daily as needed (for pain).  11/04/14  Yes Historical Provider, MD  PARoxetine (PAXIL) 40 MG tablet Take 40 mg by mouth every morning.   Yes Historical Provider, MD  potassium chloride (K-DUR) 10 MEQ tablet Take 1 tablet by mouth daily. 02/22/15  Yes Historical Provider, MD    SPIRIVA HANDIHALER 18 MCG inhalation capsule Take 18 mcg by mouth daily.  11/04/14  Yes Historical Provider, MD    Social History:  reports that she has been smoking Cigarettes.  She does not have any smokeless tobacco history on file. She reports that she does not drink alcohol or use illicit drugs.  Family History  Problem Relation Age of Onset  . Diabetes Mother   . Diabetes Father   . Diabetes Brother   . Colon cancer Neg Hx     Filed Weights   03/17/15 1649  Weight: 67.132 kg (148 lb)    All the positives are listed in BOLD  Review of Systems:  HEENT: Headache, blurred vision, runny nose, sore throat Neck: Hypothyroidism, hyperthyroidism,,lymphadenopathy Chest : Shortness of breath, history of COPD, Asthma Heart : Chest pain, history of coronary arterey disease GI:  Nausea, vomiting, diarrhea, constipation, GERD GU: Dysuria, urgency, frequency of urination, hematuria Neuro: Stroke, seizures, syncope Psych: Depression, anxiety, hallucinations   Physical Exam: Blood pressure 145/62, pulse 90, temperature 98.3 F (36.8 C), temperature source Oral, resp. rate 24, height '5\' 6"'$  (1.676 m), weight 67.132 kg (148 lb), SpO2 85 %. Constitutional:   Patient is a well-developed and well-nourished *female in no acute distress and cooperative with exam. Head: Normocephalic and atraumatic Mouth: Mucus membranes moist Eyes: PERRL, EOMI, conjunctivae normal Neck: Supple, No Thyromegaly Cardiovascular: RRR, S1 normal, S2 normal Pulmonary/Chest: Decreased breath sounds bilaterally, scattered wheezing Abdominal: Soft. Non-tender, non-distended, bowel sounds are normal, no masses, organomegaly, or guarding present.  Neurological: A&O x3, Strength is normal and symmetric bilaterally, cranial nerve II-XII are grossly intact, no focal motor deficit, sensory intact to light touch bilaterally.  Extremities : No Cyanosis, Clubbing or Edema  Labs on Admission:  Basic Metabolic  Panel:  Recent Labs Lab 03/17/15 1729  NA 136  K 3.6  CL 95*  CO2 32  GLUCOSE 99  BUN 5*  CREATININE 0.88  CALCIUM 9.3   Liver Function Tests: No results for input(s): AST, ALT, ALKPHOS, BILITOT, PROT, ALBUMIN in the last 168 hours. No results for input(s): LIPASE, AMYLASE in the last 168 hours. No results for input(s): AMMONIA in the last 168 hours. CBC:  Recent Labs Lab 03/17/15 1729  WBC 6.5  NEUTROABS 4.3  HGB 11.9*  HCT 38.1  MCV 89.9  PLT 322   Cardiac Enzymes: No results for input(s): CKTOTAL, CKMB, CKMBINDEX, TROPONINI in the last 168 hours.  BNP (last 3 results)  Recent Labs  01/07/15 1757  BNP 31.0    ProBNP (last 3 results) No results for input(s): PROBNP in the last 8760 hours.  CBG: No results for input(s): GLUCAP in the last 168 hours.  Radiological Exams on Admission: Dg Chest Port 1 View  03/17/2015   CLINICAL DATA:  Acute onset of shortness of breath and wheezing. Initial encounter.  EXAM: PORTABLE CHEST - 1 VIEW  COMPARISON:  Chest radiograph performed 01/07/2015  FINDINGS: The lungs are well-aerated. Mild right basilar and left-sided atelectasis or scarring are seen, with slight interstitial prominence. There is no evidence of pleural effusion or pneumothorax.  The cardiomediastinal silhouette is within normal limits. No acute osseous abnormalities are seen. Cervical spinal fusion hardware is partially imaged.  IMPRESSION: Mild right basilar and left-sided atelectasis or scarring seen, with slight interstitial prominence. This appears relatively stable from the prior study, and may reflect chronic changes of COPD. No superimposed airspace consolidation seen at this time.   Electronically Signed   By: Garald Balding M.D.   On: 03/17/2015 17:51      Assessment/Plan Active Problems:   COPD with acute exacerbation   Chronic back pain   GERD (gastroesophageal reflux disease)   COPD exacerbation  COPD exacerbation We will admit the patient  under observation, start DuoNeb nebulizers every 6 hours, Solu-Medrol 60 g IV every 6 hours, Mucinex 1 tablet by mouth twice a day. Continue oxygen via nasal cannula.  Dysuria Will check UA, follow urine culture results.  Diarrhea Patient of diarrhea after she was prescribed antibiotics a week ago, will check stool for C. difficile PCR.   Code status: Full code  Family discussion: Admission, patients condition and plan of care including tests being ordered have been discussed with the patient and her husband at bedside** who indicate understanding and agree with the plan and Code Status.   Time Spent on Admission: 60 minutes  [place] [facility] Pager: [phone] 03/17/2015, 8:25 PM  If 7PM-7AM, please contact night-coverage  [email or web address]  Password [email or web address]

## 2015-03-17 NOTE — ED Notes (Signed)
Ambulate pt on home O2 tank. pt was 94% before walk and dropped to 85% on the way back to bed after she recovered To 95%

## 2015-03-18 ENCOUNTER — Encounter (HOSPITAL_COMMUNITY): Admission: RE | Admit: 2015-03-18 | Payer: Medicare HMO | Source: Ambulatory Visit

## 2015-03-18 ENCOUNTER — Telehealth: Payer: Self-pay | Admitting: Internal Medicine

## 2015-03-18 DIAGNOSIS — K219 Gastro-esophageal reflux disease without esophagitis: Secondary | ICD-10-CM

## 2015-03-18 DIAGNOSIS — E871 Hypo-osmolality and hyponatremia: Secondary | ICD-10-CM | POA: Diagnosis present

## 2015-03-18 DIAGNOSIS — G8929 Other chronic pain: Secondary | ICD-10-CM

## 2015-03-18 DIAGNOSIS — J9621 Acute and chronic respiratory failure with hypoxia: Secondary | ICD-10-CM

## 2015-03-18 DIAGNOSIS — G4730 Sleep apnea, unspecified: Secondary | ICD-10-CM | POA: Diagnosis present

## 2015-03-18 DIAGNOSIS — F419 Anxiety disorder, unspecified: Secondary | ICD-10-CM

## 2015-03-18 DIAGNOSIS — M549 Dorsalgia, unspecified: Secondary | ICD-10-CM

## 2015-03-18 DIAGNOSIS — F32A Depression, unspecified: Secondary | ICD-10-CM | POA: Diagnosis present

## 2015-03-18 DIAGNOSIS — J441 Chronic obstructive pulmonary disease with (acute) exacerbation: Principal | ICD-10-CM

## 2015-03-18 DIAGNOSIS — F329 Major depressive disorder, single episode, unspecified: Secondary | ICD-10-CM | POA: Diagnosis present

## 2015-03-18 LAB — CBC
HCT: 36.1 % (ref 36.0–46.0)
Hemoglobin: 11.2 g/dL — ABNORMAL LOW (ref 12.0–15.0)
MCH: 27.7 pg (ref 26.0–34.0)
MCHC: 31 g/dL (ref 30.0–36.0)
MCV: 89.1 fL (ref 78.0–100.0)
Platelets: 315 10*3/uL (ref 150–400)
RBC: 4.05 MIL/uL (ref 3.87–5.11)
RDW: 15.4 % (ref 11.5–15.5)
WBC: 5.7 10*3/uL (ref 4.0–10.5)

## 2015-03-18 LAB — URINALYSIS, ROUTINE W REFLEX MICROSCOPIC
BILIRUBIN URINE: NEGATIVE
Glucose, UA: NEGATIVE mg/dL
Ketones, ur: NEGATIVE mg/dL
Leukocytes, UA: NEGATIVE
Nitrite: NEGATIVE
PH: 5.5 (ref 5.0–8.0)
Protein, ur: NEGATIVE mg/dL
Specific Gravity, Urine: 1.02 (ref 1.005–1.030)
UROBILINOGEN UA: 0.2 mg/dL (ref 0.0–1.0)

## 2015-03-18 LAB — COMPREHENSIVE METABOLIC PANEL
ALK PHOS: 95 U/L (ref 38–126)
ALT: 9 U/L — AB (ref 14–54)
AST: 17 U/L (ref 15–41)
Albumin: 3 g/dL — ABNORMAL LOW (ref 3.5–5.0)
Anion gap: 9 (ref 5–15)
BILIRUBIN TOTAL: 0.5 mg/dL (ref 0.3–1.2)
BUN: 6 mg/dL (ref 6–20)
CHLORIDE: 91 mmol/L — AB (ref 101–111)
CO2: 33 mmol/L — AB (ref 22–32)
Calcium: 8.8 mg/dL — ABNORMAL LOW (ref 8.9–10.3)
Creatinine, Ser: 0.83 mg/dL (ref 0.44–1.00)
GFR calc Af Amer: >60 mL/min (ref >60)
Glucose, Bld: 182 mg/dL — ABNORMAL HIGH (ref 65–99)
Potassium: 4.6 mmol/L (ref 3.5–5.1)
Sodium: 133 mmol/L — ABNORMAL LOW (ref 135–145)
Total Protein: 6.3 g/dL — ABNORMAL LOW (ref 6.5–8.1)

## 2015-03-18 LAB — URINE MICROSCOPIC-ADD ON

## 2015-03-18 MED ORDER — IPRATROPIUM-ALBUTEROL 0.5-2.5 (3) MG/3ML IN SOLN
3.0000 mL | RESPIRATORY_TRACT | Status: DC
  Administered 2015-03-18 – 2015-03-20 (×12): 3 mL via RESPIRATORY_TRACT
  Filled 2015-03-18 (×12): qty 3

## 2015-03-18 MED ORDER — IPRATROPIUM BROMIDE 0.02 % IN SOLN: 0.5000 mg | Freq: Four times a day (QID) | RESPIRATORY_TRACT | Status: DC

## 2015-03-18 MED ORDER — LISINOPRIL 10 MG PO TABS
10.0000 mg | ORAL_TABLET | Freq: Every day | ORAL | Status: DC
  Administered 2015-03-18 – 2015-03-20 (×3): 10 mg via ORAL
  Filled 2015-03-18 (×3): qty 1

## 2015-03-18 MED ORDER — CLOPIDOGREL BISULFATE 75 MG PO TABS
75.0000 mg | ORAL_TABLET | Freq: Every day | ORAL | Status: DC
  Administered 2015-03-18 – 2015-03-20 (×3): 75 mg via ORAL
  Filled 2015-03-18 (×3): qty 1

## 2015-03-18 MED ORDER — GUAIFENESIN ER 600 MG PO TB12
600.0000 mg | ORAL_TABLET | Freq: Two times a day (BID) | ORAL | Status: DC
  Administered 2015-03-18 – 2015-03-20 (×6): 600 mg via ORAL
  Filled 2015-03-18 (×6): qty 1

## 2015-03-18 MED ORDER — ALBUTEROL SULFATE (2.5 MG/3ML) 0.083% IN NEBU: 2.5000 mg | INHALATION_SOLUTION | RESPIRATORY_TRACT | Status: DC | PRN

## 2015-03-18 MED ORDER — ALBUTEROL SULFATE (2.5 MG/3ML) 0.083% IN NEBU: 2.5000 mg | INHALATION_SOLUTION | RESPIRATORY_TRACT | Status: DC

## 2015-03-18 MED ORDER — IPRATROPIUM-ALBUTEROL 0.5-2.5 (3) MG/3ML IN SOLN
3.0000 mL | Freq: Four times a day (QID) | RESPIRATORY_TRACT | Status: DC
  Administered 2015-03-18 (×2): 3 mL via RESPIRATORY_TRACT
  Filled 2015-03-18: qty 3

## 2015-03-18 MED ORDER — ONDANSETRON HCL 4 MG PO TABS: 4.0000 mg | ORAL_TABLET | Freq: Four times a day (QID) | ORAL | Status: DC | PRN

## 2015-03-18 MED ORDER — SODIUM CHLORIDE 0.9 % IV SOLN
INTRAVENOUS | Status: AC
  Administered 2015-03-18: 01:00:00 via INTRAVENOUS

## 2015-03-18 MED ORDER — METHYLPREDNISOLONE SODIUM SUCC 125 MG IJ SOLR
60.0000 mg | Freq: Four times a day (QID) | INTRAMUSCULAR | Status: DC
  Administered 2015-03-18 – 2015-03-19 (×6): 60 mg via INTRAVENOUS
  Filled 2015-03-18 (×5): qty 2

## 2015-03-18 MED ORDER — PAROXETINE HCL 20 MG PO TABS
40.0000 mg | ORAL_TABLET | ORAL | Status: DC
  Administered 2015-03-18 – 2015-03-20 (×3): 40 mg via ORAL
  Filled 2015-03-18 (×3): qty 2

## 2015-03-18 MED ORDER — MELOXICAM 7.5 MG PO TABS
7.5000 mg | ORAL_TABLET | Freq: Every morning | ORAL | Status: DC
  Administered 2015-03-18 – 2015-03-20 (×3): 7.5 mg via ORAL
  Filled 2015-03-18 (×4): qty 1

## 2015-03-18 MED ORDER — LORATADINE 10 MG PO TABS
10.0000 mg | ORAL_TABLET | Freq: Every morning | ORAL | Status: DC
  Administered 2015-03-18 – 2015-03-20 (×3): 10 mg via ORAL
  Filled 2015-03-18 (×3): qty 1

## 2015-03-18 MED ORDER — SODIUM CHLORIDE 0.9 % IJ SOLN
3.0000 mL | Freq: Two times a day (BID) | INTRAMUSCULAR | Status: DC
Start: 2015-03-18 — End: 2015-03-20
  Administered 2015-03-18 – 2015-03-20 (×6): 3 mL via INTRAVENOUS

## 2015-03-18 MED ORDER — SODIUM CHLORIDE 0.9 % IV SOLN
250.0000 mL | INTRAVENOUS | Status: DC | PRN
Start: 2015-03-18 — End: 2015-03-20

## 2015-03-18 MED ORDER — ALPRAZOLAM 1 MG PO TABS
1.0000 mg | ORAL_TABLET | Freq: Four times a day (QID) | ORAL | Status: DC | PRN
  Administered 2015-03-18 – 2015-03-19 (×5): 1 mg via ORAL
  Filled 2015-03-18 (×5): qty 1

## 2015-03-18 MED ORDER — PANTOPRAZOLE SODIUM 40 MG PO TBEC
40.0000 mg | DELAYED_RELEASE_TABLET | Freq: Every day | ORAL | Status: DC
  Administered 2015-03-18 – 2015-03-20 (×3): 40 mg via ORAL
  Filled 2015-03-18 (×3): qty 1

## 2015-03-18 MED ORDER — ENOXAPARIN SODIUM 40 MG/0.4ML ~~LOC~~ SOLN
40.0000 mg | SUBCUTANEOUS | Status: DC
  Administered 2015-03-18 (×2): 40 mg via SUBCUTANEOUS
  Filled 2015-03-18 (×2): qty 0.4

## 2015-03-18 MED ORDER — SODIUM CHLORIDE 0.9 % IJ SOLN
3.0000 mL | INTRAMUSCULAR | Status: DC | PRN
  Administered 2015-03-18 – 2015-03-19 (×2): 3 mL via INTRAVENOUS
  Filled 2015-03-18 (×2): qty 3

## 2015-03-18 MED ORDER — FUROSEMIDE 20 MG PO TABS
20.0000 mg | ORAL_TABLET | Freq: Every morning | ORAL | Status: DC
  Administered 2015-03-18 – 2015-03-20 (×3): 20 mg via ORAL
  Filled 2015-03-18 (×3): qty 1

## 2015-03-18 MED ORDER — DONEPEZIL HCL 5 MG PO TABS
10.0000 mg | ORAL_TABLET | Freq: Every day | ORAL | Status: DC
  Administered 2015-03-18 – 2015-03-19 (×3): 10 mg via ORAL
  Filled 2015-03-18 (×3): qty 2

## 2015-03-18 MED ORDER — ALBUTEROL SULFATE (2.5 MG/3ML) 0.083% IN NEBU: 2.5000 mg | INHALATION_SOLUTION | Freq: Four times a day (QID) | RESPIRATORY_TRACT | Status: DC

## 2015-03-18 MED ORDER — ONDANSETRON HCL 4 MG/2ML IJ SOLN: 4.0000 mg | Freq: Four times a day (QID) | INTRAMUSCULAR | Status: DC | PRN

## 2015-03-18 MED ORDER — OXYCODONE HCL 5 MG PO TABS
20.0000 mg | ORAL_TABLET | Freq: Four times a day (QID) | ORAL | Status: DC | PRN
  Administered 2015-03-18 – 2015-03-20 (×5): 20 mg via ORAL
  Filled 2015-03-18 (×6): qty 4

## 2015-03-18 NOTE — Telephone Encounter (Signed)
Noted  

## 2015-03-18 NOTE — Telephone Encounter (Signed)
One of the nurses Maudie Mercury) from Short Stay called this morning to let us know that patient was scheduled for a pre-op on Friday, but she had been admitted to rm# 331 for COPD and is still on the schedule for [facility] on 7/7 for EGD/DIL/PAC.  She wanted to know if RMR still wanted to see her and/or keep her on the schedule. Please advise.

## 2015-03-18 NOTE — Telephone Encounter (Signed)
Will check on her later in the week

## 2015-03-18 NOTE — Telephone Encounter (Signed)
Please advise 

## 2015-03-18 NOTE — Progress Notes (Signed)
[facility] PROGRESS NOTE  Olivia Randall YKD:[id number] DOB: Mar 25, 1951 DOA: 03/17/2015 PCP: Terald Sleeper, PA-C  Assessment/Plan: Acute on chronic respiratory failure: related to copd exacerbation. Improved this am but not at baseline. Will continue nebs but change to every 4 hours, continue solumedrol at current dose and musinex. Oxygen sats 94% on 2L. Continue to monitor Active Problems:   COPD with acute exacerbation: treated for "bronchitis" last week. Chest xray stable. See #1. May be progression of disease. Reports not seeing pulmonologist in "very long time". Likely benefit referral at discharge for PFT to establish baseline.   Hyponatremia: mild. May be related to diarrhea. Gentle IV fluids. Monitor  Diarrhea: may be related to recent antibiotics. cdiff negative. Will monitor.     Chronic back pain: stable at baseline continue home med    GERD (gastroesophageal reflux disease): stable at baseline    Dysphagia, pharyngoesophageal phase: scheduled for EGD/diliation 03/20/15.     Sleep apnea: cpap at home. Will continue    Anxiety and depression: stable at baseline    Code Status: full Family Communication: husband at baseline Disposition Plan: home when ready   Consultants:  none  Procedures:  none  Antibiotics:    HPI/Subjective: Sitting on side of bed. Reports improved breathing effort. Complained headache  Objective: Filed Vitals:   03/18/15 0536  BP: 126/69  Pulse: 97  Temp: 98.1 F (36.7 C)  Resp: 20    Intake/Output Summary (Last 24 hours) at 03/18/15 1131 Last data filed at 03/18/15 0841  Gross per 24 hour  Intake 438.75 ml  Output    650 ml  Net -211.25 ml   Filed Weights   03/17/15 1649  Weight: 67.132 kg (148 lb)    Exam:   General:  Appears comfortable but anxious  Cardiovascular: rrr no MGR no LE edema  Respiratory: mild increased work of breathing with conversation. bs quite diminished throughtout  Abdomen: flat soft  +BS non-tender  Musculoskeletal: joints without swelling/erythema   Data Reviewed: Basic Metabolic Panel:  Recent Labs Lab 03/17/15 1729 03/18/15 0638  NA 136 133*  K 3.6 4.6  CL 95* 91*  CO2 32 33*  GLUCOSE 99 182*  BUN 5* 6  CREATININE 0.88 0.83  CALCIUM 9.3 8.8*   Liver Function Tests:  Recent Labs Lab 03/18/15 0638  AST 17  ALT 9*  ALKPHOS 95  BILITOT 0.5  PROT 6.3*  ALBUMIN 3.0*   No results for input(s): LIPASE, AMYLASE in the last 168 hours. No results for input(s): AMMONIA in the last 168 hours. CBC:  Recent Labs Lab 03/17/15 1729 03/18/15 0638  WBC 6.5 5.7  NEUTROABS 4.3  --   HGB 11.9* 11.2*  HCT 38.1 36.1  MCV 89.9 89.1  PLT 322 315   Cardiac Enzymes: No results for input(s): CKTOTAL, CKMB, CKMBINDEX, TROPONINI in the last 168 hours. BNP (last 3 results)  Recent Labs  01/07/15 1757  BNP 31.0    ProBNP (last 3 results) No results for input(s): PROBNP in the last 8760 hours.  CBG: No results for input(s): GLUCAP in the last 168 hours.  Recent Results (from the past 240 hour(s))  Clostridium Difficile by PCR (not at [facility])     Status: None   Collection Time: 03/17/15 10:06 PM  Result Value Ref Range Status   C difficile by pcr NEGATIVE NEGATIVE Final     Studies: Dg Chest Port 1 View  03/17/2015   CLINICAL DATA:  Acute onset of shortness of breath  and wheezing. Initial encounter.  EXAM: PORTABLE CHEST - 1 VIEW  COMPARISON:  Chest radiograph performed 01/07/2015  FINDINGS: The lungs are well-aerated. Mild right basilar and left-sided atelectasis or scarring are seen, with slight interstitial prominence. There is no evidence of pleural effusion or pneumothorax.  The cardiomediastinal silhouette is within normal limits. No acute osseous abnormalities are seen. Cervical spinal fusion hardware is partially imaged.  IMPRESSION: Mild right basilar and left-sided atelectasis or scarring seen, with slight interstitial prominence. This appears  relatively stable from the prior study, and may reflect chronic changes of COPD. No superimposed airspace consolidation seen at this time.   Electronically Signed   By: Garald Balding M.D.   On: 03/17/2015 17:51    Scheduled Meds: . sodium chloride   Intravenous STAT  . clopidogrel  75 mg Oral Daily  . donepezil  10 mg Oral QHS  . enoxaparin (LOVENOX) injection  40 mg Subcutaneous Q24H  . furosemide  20 mg Oral q morning - 10a  . guaiFENesin  600 mg Oral BID  . ipratropium-albuterol  3 mL Nebulization Q4H  . lisinopril  10 mg Oral Daily  . loratadine  10 mg Oral q morning - 10a  . meloxicam  7.5 mg Oral q morning - 10a  . methylPREDNISolone (SOLU-MEDROL) injection  60 mg Intravenous Q6H  . pantoprazole  40 mg Oral Daily  . PARoxetine  40 mg Oral BH-q7a  . sodium chloride  3 mL Intravenous Q12H   Continuous Infusions:   Principal Problem:      Time spent: [place] [facility] Pager [phone]. If 7PM-7AM, please contact night-coverage at [email or web address], password [facility] 03/18/2015, 11:31 AM  LOS: 1 day

## 2015-03-19 ENCOUNTER — Telehealth: Payer: Self-pay | Admitting: Gastroenterology

## 2015-03-19 LAB — BASIC METABOLIC PANEL
Anion gap: 7 (ref 5–15)
BUN: 13 mg/dL (ref 6–20)
CALCIUM: 9.4 mg/dL (ref 8.9–10.3)
CHLORIDE: 95 mmol/L — AB (ref 101–111)
CO2: 36 mmol/L — ABNORMAL HIGH (ref 22–32)
CREATININE: 0.9 mg/dL (ref 0.44–1.00)
Glucose, Bld: 155 mg/dL — ABNORMAL HIGH (ref 65–99)
Potassium: 4.7 mmol/L (ref 3.5–5.1)
Sodium: 138 mmol/L (ref 135–145)

## 2015-03-19 MED ORDER — NICOTINE 21 MG/24HR TD PT24
21.0000 mg | MEDICATED_PATCH | Freq: Every day | TRANSDERMAL | Status: DC
  Administered 2015-03-19 – 2015-03-20 (×2): 21 mg via TRANSDERMAL
  Filled 2015-03-19 (×2): qty 1

## 2015-03-19 MED ORDER — BENZONATATE 100 MG PO CAPS
100.0000 mg | ORAL_CAPSULE | Freq: Two times a day (BID) | ORAL | Status: DC
  Administered 2015-03-19 – 2015-03-20 (×3): 100 mg via ORAL
  Filled 2015-03-19 (×3): qty 1

## 2015-03-19 MED ORDER — LEVOFLOXACIN 750 MG PO TABS
750.0000 mg | ORAL_TABLET | Freq: Every day | ORAL | Status: DC
  Administered 2015-03-19 – 2015-03-20 (×2): 750 mg via ORAL
  Filled 2015-03-19 (×2): qty 1

## 2015-03-19 MED ORDER — ENOXAPARIN SODIUM 40 MG/0.4ML ~~LOC~~ SOLN
40.0000 mg | SUBCUTANEOUS | Status: DC
  Administered 2015-03-20: 40 mg via SUBCUTANEOUS
  Filled 2015-03-19: qty 0.4

## 2015-03-19 MED ORDER — METHYLPREDNISOLONE SODIUM SUCC 40 MG IJ SOLR
40.0000 mg | Freq: Four times a day (QID) | INTRAMUSCULAR | Status: DC
  Administered 2015-03-19 – 2015-03-20 (×4): 40 mg via INTRAVENOUS
  Filled 2015-03-19 (×4): qty 1

## 2015-03-19 NOTE — Telephone Encounter (Signed)
Noted and [name] is aware

## 2015-03-19 NOTE — Telephone Encounter (Signed)
Received call from Dyanne Carrel, NP. Per attending hospitalist, it is felt that patient needs to have procedure rescheduled for a different time after further assessment today. We will cancel her procedure and have her follow-up with me as outpatient (may put in an urgent slot) and proceed with EGD/dilation in a more elective setting.   Please cancel procedure for tomorrow, as Dr. Truman Hayward feels it is in her best interest to postpone this for now.   Orvil Feil, ANP-BC [facility]

## 2015-03-19 NOTE — Progress Notes (Signed)
Patient seen briefly today to assess readiness for EGD with dilation on 7/7 with Propofol by Dr. Gala Romney. Seen as an outpatient February 18, 2015 and scheduled for procedure. Admitted 7/4 with COPD exacerbation. Improved since admission. On 2 liters nasal cannula with 100% sats. Appears to be nearing baseline. No signs of distress. Should be appropriate for procedure on 7/7. Notified Pam in endoscopy (#2094) that she may remain on the schedule. Hopefully, she can be discharged home after this. Discussed briefly with Dyanne Carrel, NP, that once EGD tomorrow is completed, we will follow as outpatient. Per hospitalist, anticipate discharge home tomorrow.  Orvil Feil, ANP-BC [facility]

## 2015-03-19 NOTE — Care Management Note (Signed)
Case Management Note  Patient Details  Name: Olivia Randall MRN: [id number] Date of Birth: 28-Jun-1951  Subjective/Objective:                  Pt admitted from home with COPd exacerbation. Pt lives with her husband and will return home at discharge. Pt is independent with ADL's. Pt has a cane and walker for prn use. Pt has home O2, neb machine, and bipap for home use with [employer]. Pt stated that she does not think her home O2 and bipap is not working correctly.  Action/Plan: HH RN arranged with [facility] per pts choice. [name] of [facility] is aware and will collect the pts information from the chart. Cm called [employer] to assess pts equipment at discharge. Pt and pts nurse is aware of discharge arrangements.  Expected Discharge Date:  03/20/15               Expected Discharge Plan:  [place]  In-House Referral:  NA  Discharge planning Services  CM Consult  Post Acute Care Choice:  Home Health Choice offered to:  Patient  DME Arranged:    DME Agency:     HH Arranged:  RN [place] Agency:  [place]  Status of Service:  Completed, signed off  Medicare Important Message Given:    Date Medicare IM Given:    Medicare IM give by:    Date Additional Medicare IM Given:    Additional Medicare Important Message give by:     If discussed at [place] of Stay Meetings, dates discussed:    Additional Comments:  Joylene Draft, RN 03/19/2015, 10:25 AM

## 2015-03-19 NOTE — Telephone Encounter (Signed)
Patient was seen today. Note in epic.

## 2015-03-19 NOTE — Progress Notes (Signed)
[facility] PROGRESS NOTE  Olivia Randall ASN:[id number] DOB: 1951-08-05 DOA: 03/17/2015 PCP: Terald Sleeper, PA-C  Assessment/Plan: Acute on chronic respiratory failure: related to copd exacerbation. Continues to improve slowly. Will continue nebs., continue solumedrol at lower dose and musinex. Add tessalon perles and levaquin.  Oxygen sats 100 on 2L. Continue to monitor Active Problems:  COPD with acute exacerbation: treated for "bronchitis" last week. Chest xray stable. See #1. May be progression of disease. Reports not seeing pulmonologist in "very long time". Likely benefit referral at discharge for PFT to establish baseline.   Hyponatremia: resolved this am  Diarrhea: may be related to recent antibiotics. cdiff negative. Resolved.     Chronic back pain: stable at baseline continue home med   GERD (gastroesophageal reflux disease): stable at baseline   Dysphagia, pharyngoesophageal phase: scheduled for EGD/diliation 03/20/15.    Sleep apnea: cpap at home. Will continue   Anxiety and depression: stable at baseline  Code Status: full Family Communication: husband at bedside Disposition Plan: home hopefully tomorrow   Consultants:  none  Procedures:  none  Antibiotics:  Levaquin 03/19/15>>>  HPI/Subjective: Sitting on side of bed. Reports sleeping well and breathing "a little better".  Objective: Filed Vitals:   03/19/15 0523  BP: 126/71  Pulse: 84  Temp: 98.2 F (36.8 C)  Resp: 20   No intake or output data in the 24 hours ending 03/19/15 0853 Filed Weights   03/17/15 1649  Weight: 67.132 kg (148 lb)    Exam:   General:  Appears chronically ill. comfortable  Cardiovascular: rrr no MGR no LE edema  Respiratory: moderate increased work of breathing with conversation. Improved air movement with wheeze  Abdomen: soft +BS non-tender to palpation  Musculoskeletal: no clubbing or cyanosis   Data Reviewed: Basic Metabolic Panel:  Recent  Labs Lab 03/17/15 1729 03/18/15 0638 03/19/15 0647  NA 136 133* 138  K 3.6 4.6 4.7  CL 95* 91* 95*  CO2 32 33* 36*  GLUCOSE 99 182* 155*  BUN 5* 6 13  CREATININE 0.88 0.83 0.90  CALCIUM 9.3 8.8* 9.4   Liver Function Tests:  Recent Labs Lab 03/18/15 0638  AST 17  ALT 9*  ALKPHOS 95  BILITOT 0.5  PROT 6.3*  ALBUMIN 3.0*   No results for input(s): LIPASE, AMYLASE in the last 168 hours. No results for input(s): AMMONIA in the last 168 hours. CBC:  Recent Labs Lab 03/17/15 1729 03/18/15 0638  WBC 6.5 5.7  NEUTROABS 4.3  --   HGB 11.9* 11.2*  HCT 38.1 36.1  MCV 89.9 89.1  PLT 322 315   Cardiac Enzymes: No results for input(s): CKTOTAL, CKMB, CKMBINDEX, TROPONINI in the last 168 hours. BNP (last 3 results)  Recent Labs  01/07/15 1757  BNP 31.0    ProBNP (last 3 results) No results for input(s): PROBNP in the last 8760 hours.  CBG: No results for input(s): GLUCAP in the last 168 hours.  Recent Results (from the past 240 hour(s))  Clostridium Difficile by PCR (not at [facility])     Status: None   Collection Time: 03/17/15 10:06 PM  Result Value Ref Range Status   C difficile by pcr NEGATIVE NEGATIVE Final     Studies: Dg Chest Port 1 View  03/17/2015   CLINICAL DATA:  Acute onset of shortness of breath and wheezing. Initial encounter.  EXAM: PORTABLE CHEST - 1 VIEW  COMPARISON:  Chest radiograph performed 01/07/2015  FINDINGS: The lungs are well-aerated. Mild right basilar  and left-sided atelectasis or scarring are seen, with slight interstitial prominence. There is no evidence of pleural effusion or pneumothorax.  The cardiomediastinal silhouette is within normal limits. No acute osseous abnormalities are seen. Cervical spinal fusion hardware is partially imaged.  IMPRESSION: Mild right basilar and left-sided atelectasis or scarring seen, with slight interstitial prominence. This appears relatively stable from the prior study, and may reflect chronic changes of  COPD. No superimposed airspace consolidation seen at this time.   Electronically Signed   By: Garald Balding M.D.   On: 03/17/2015 17:51    Scheduled Meds: . clopidogrel  75 mg Oral Daily  . donepezil  10 mg Oral QHS  . enoxaparin (LOVENOX) injection  40 mg Subcutaneous Q24H  . furosemide  20 mg Oral q morning - 10a  . guaiFENesin  600 mg Oral BID  . ipratropium-albuterol  3 mL Nebulization Q4H  . levofloxacin  750 mg Oral Daily  . lisinopril  10 mg Oral Daily  . loratadine  10 mg Oral q morning - 10a  . meloxicam  7.5 mg Oral q morning - 10a  . methylPREDNISolone (SOLU-MEDROL) injection  40 mg Intravenous Q6H  . pantoprazole  40 mg Oral Daily  . PARoxetine  40 mg Oral BH-q7a  . sodium chloride  3 mL Intravenous Q12H   Continuous Infusions:   Principal Problem:   Acute on chronic respiratory failure Active Problems:   COPD with acute exacerbation   Chronic back pain   GERD (gastroesophageal reflux disease)   Dysphagia, pharyngoesophageal phase   COPD exacerbation   Sleep apnea   Anxiety and depression   Hyponatremia    Time spent: 30 minutes    [place] [facility] Pager [phone]. If 7PM-7AM, please contact night-coverage at [email or web address], password [facility] 03/19/2015, 8:53 AM  LOS: 2 days

## 2015-03-20 ENCOUNTER — Ambulatory Visit (HOSPITAL_COMMUNITY): Admission: RE | Admit: 2015-03-20 | Payer: Medicare HMO | Source: Ambulatory Visit | Admitting: Internal Medicine

## 2015-03-20 ENCOUNTER — Encounter (HOSPITAL_COMMUNITY): Admission: RE | Payer: Self-pay | Source: Ambulatory Visit

## 2015-03-20 LAB — BASIC METABOLIC PANEL
Anion gap: 5 (ref 5–15)
BUN: 18 mg/dL (ref 6–20)
CHLORIDE: 94 mmol/L — AB (ref 101–111)
CO2: 36 mmol/L — ABNORMAL HIGH (ref 22–32)
Calcium: 9.2 mg/dL (ref 8.9–10.3)
Creatinine, Ser: 0.81 mg/dL (ref 0.44–1.00)
GFR calc Af Amer: >60 mL/min (ref >60)
Glucose, Bld: 124 mg/dL — ABNORMAL HIGH (ref 65–99)
POTASSIUM: 4.2 mmol/L (ref 3.5–5.1)
SODIUM: 135 mmol/L (ref 135–145)

## 2015-03-20 LAB — URINE CULTURE

## 2015-03-20 SURGERY — ESOPHAGOGASTRODUODENOSCOPY (EGD) WITH PROPOFOL
Anesthesia: Monitor Anesthesia Care

## 2015-03-20 MED ORDER — GUAIFENESIN ER 600 MG PO TB12: 600.0000 mg | ORAL_TABLET | Freq: Two times a day (BID) | ORAL | Status: DC

## 2015-03-20 MED ORDER — BENZONATATE 100 MG PO CAPS: 100.0000 mg | ORAL_CAPSULE | Freq: Two times a day (BID) | ORAL | Status: DC

## 2015-03-20 MED ORDER — LEVOFLOXACIN 750 MG PO TABS: 750.0000 mg | ORAL_TABLET | Freq: Every day | ORAL | Status: DC

## 2015-03-20 MED ORDER — PREDNISONE 10 MG PO TABS: ORAL_TABLET | ORAL | Status: DC

## 2015-03-20 MED ORDER — NICOTINE 21 MG/24HR TD PT24: 21.0000 mg | MEDICATED_PATCH | Freq: Every day | TRANSDERMAL | Status: DC

## 2015-03-20 NOTE — Progress Notes (Signed)
1122 D/C instructions, paperwork and hard Rxs given to patient and patient's husband. D/C summary faxed to [phone] as ordered. IV catheter removed from RIGHT hand, catheter intact, no s/s of infection noted, patient tolerated well w/no c/o pain or discomfort noted. Patient left facility via w/c w/portable O2 tank on 2L from [place].

## 2015-03-20 NOTE — Care Management (Signed)
Important Message  Patient Details  Name: IMANE BURROUGH MRN: [id number] Date of Birth: 04/15/1951   Medicare Important Message Given:  Yes-second notification given    Joylene Draft, RN 03/20/2015, 10:10 AM

## 2015-03-20 NOTE — Care Management Note (Signed)
Case Management Note  Patient Details  Name: Olivia Randall MRN: [id number] Date of Birth: August 27, 1951  Subjective/Objective:                    Action/Plan:   Expected Discharge Date:  03/20/15               Expected Discharge Plan:  [place]  In-House Referral:  NA  Discharge planning Services  CM Consult  Post Acute Care Choice:  Home Health Choice offered to:  Patient  DME Arranged:    DME Agency:     HH Arranged:  RN [place] Agency:  [place]  Status of Service:  Completed, signed off  Medicare Important Message Given:  Yes-second notification given Date Medicare IM Given:    Medicare IM give by:    Date Additional Medicare IM Given:    Additional Medicare Important Message give by:     If discussed at [place] of Stay Meetings, dates discussed:    Additional Comments: Pt discharged home today with [facility] RN (per pts choice). [name] of [facility] is aware and will collect the pts information from the chart. [name] services to start within 48 hours. No DME needs noted. [facility] will send someone out today to check pts equipment that pt states that is not working properly. Pt and pts nurse aware of discharge arrangements. [name], RN 03/20/2015, 10:19 AM

## 2015-03-20 NOTE — Discharge Summary (Signed)
Physician Discharge Summary  [name] DOB: 1951/07/05 DOA: 03/17/2015  PCP: Terald Sleeper, PA-C  Admit date: 03/17/2015 Discharge date: 03/20/2015  Time spent: 40 minutes  Recommendations for Outpatient Follow-up:  1. Follow up with PCP scheduled for 03/28/15 for evaluation of respiratory status. Consider referral to pulmonology and/or PFT establish new baseline  2. GI to contact for follow up and to reschedule EGD  Discharge Diagnoses:  Principal Problem:   Acute on chronic respiratory failure Active Problems:   COPD with acute exacerbation   Chronic back pain   GERD (gastroesophageal reflux disease)   Tobacco use disorder   Dysphagia, pharyngoesophageal phase   COPD exacerbation   Sleep apnea   Anxiety and depression   Hyponatremia   Discharge Condition: stable  Diet recommendation: regular  Filed Weights   03/17/15 1649 03/20/15 0631  Weight: 67.132 kg (148 lb) 68.765 kg (151 lb 9.6 oz)    History of present illness:  64 year old female whowith past medical history of COPD (chronic obstructive pulmonary disease); Hypertension; Sleep apnea; Depression; On home O2; Chronic back pain; Anxiety and depression; GERD (gastroesophageal reflux disease); Chronic neck pain; Dementia; and MS (multiple sclerosis). Presented to  the hospital on 03/17/15 with worsening shortness of breath. Patient with a history of COPD and is on chronic home O2 with 2 L of oxygen via nasal cannula. Patient reported that she had been feeling more tight in her chest and nebulizer treatments did not help at home. Patient also was given antibiotic a week prior for bronchitis and  she developed diarrhea. Patient had 5-6 loose bowel movements on day of admission. She also complained of dysuria. On ambulation patient's O2 sats dropped to 85% on 2 L of oxygen. She denied headache, no fever. She feels bloated but denies abdominal pain.  Hospital Course:  Acute on chronic respiratory failure:  related to copd exacerbation. Admitted and provided with nebs, solumedrol and levaquin. Slowly improved. At discharge improved air movement. Oxygen sats 100 on 2L. Will discharge with prednisone taper and levaquin to complete 7 days antibiotics. Has follow up appointment with PCP 03/28/15. May benefit PFT to evaluate disease progression.  Active Problems:  COPD with acute exacerbation: treated for "bronchitis" last week. Chest xray stable. See #1. May be progression of disease. Reports not seeing pulmonologist in "very long time". Likely benefit referral at discharge for PFT to establish baseline.   Hyponatremia: resolved.  Diarrhea: may be related to recent antibiotics. cdiff negative. Resolved.    Chronic back pain: stable at baseline.   GERD (gastroesophageal reflux disease): stable at baseline   Dysphagia, pharyngoesophageal phase: scheduled for EGD/diliation 03/20/15 that was cancelled. GI will contact for follow up appointment.    Sleep apnea: cpap at home.    Anxiety and depression: stable at baseline  Procedures:    Consultations:  none  Discharge Exam: Filed Vitals:   03/20/15 0631  BP: 128/57  Pulse: 95  Temp: 99.2 F (37.3 C)  Resp: 16    General: appears chronically ill but comfortable Cardiovascular: RRR no m/g/r no Le edema Respiratory: mild increased work of breathing with conversation. Improved air movement faint wheeze BS distant  Discharge Instructions   Discharge Instructions    Diet - low sodium heart healthy    Complete by:  As directed      Discharge instructions    Complete by:  As directed   Take medication as directed.  Follow up with PCP as scheduled. Consider pulmonology referral  Increase activity slowly    Complete by:  As directed           Current Discharge Medication List    START taking these medications   Details  benzonatate (TESSALON) 100 MG capsule Take 1 capsule (100 mg total) by mouth 2 (two) times daily. Qty:  20 capsule, Refills: 0    guaiFENesin (MUCINEX) 600 MG 12 hr tablet Take 1 tablet (600 mg total) by mouth 2 (two) times daily.    levofloxacin (LEVAQUIN) 750 MG tablet Take 1 tablet (750 mg total) by mouth daily. Qty: 5 tablet, Refills: 0    nicotine (NICODERM CQ - DOSED IN MG/24 HOURS) 21 mg/24hr patch Place 1 patch (21 mg total) onto the skin daily. Qty: 28 patch, Refills: 0    predniSONE (DELTASONE) 10 MG tablet Take 4 tabs for 4 days starting 03/21/15 take 3 tabs for 4 days then take 2 tabs for 4 days then take 1 tab for 4 days then stop Qty: 40 tablet, Refills: 0      CONTINUE these medications which have NOT CHANGED   Details  albuterol (PROAIR HFA) 108 (90 BASE) MCG/ACT inhaler Inhale 2 puffs into the lungs every 6 (six) hours as needed for shortness of breath.     albuterol (PROVENTIL) (2.5 MG/3ML) 0.083% nebulizer solution Take 2.5 mg by nebulization every 6 (six) hours as needed for shortness of breath (Mixed with Ipratropium).     ALPRAZolam (XANAX) 1 MG tablet Take 1 mg by mouth 4 (four) times daily as needed for anxiety.     Aspirin-Acetaminophen-Caffeine (GOODY HEADACHE PO) Take 1 Package by mouth every 4 (four) hours as needed (Pain).    clopidogrel (PLAVIX) 75 MG tablet Take 75 mg by mouth daily.     donepezil (ARICEPT) 10 MG tablet Take 10 mg by mouth at bedtime.     DULERA 100-5 MCG/ACT AERO Take 2 puffs by mouth 2 (two) times daily.    esomeprazole (NEXIUM) 40 MG capsule Take 40 mg by mouth daily.    furosemide (LASIX) 20 MG tablet Take 20 mg by mouth every morning.     ipratropium (ATROVENT) 0.02 % nebulizer solution Take 500 mcg by nebulization every 6 (six) hours as needed for wheezing (*To be mixed with Albuterol for treatments via nebulization*).    lisinopril (PRINIVIL,ZESTRIL) 10 MG tablet Take 10 mg by mouth daily.    loratadine (CLARITIN) 10 MG tablet Take 10 mg by mouth every morning.     meloxicam (MOBIC) 7.5 MG tablet Take 7.5 mg by mouth every  morning.    Oxycodone HCl 20 MG TABS Take 20 mg by mouth 4 (four) times daily as needed (for pain).     PARoxetine (PAXIL) 40 MG tablet Take 40 mg by mouth every morning.    potassium chloride (K-DUR) 10 MEQ tablet Take 1 tablet by mouth daily.    SPIRIVA HANDIHALER 18 MCG inhalation capsule Take 18 mcg by mouth daily.        No Known Allergies Follow-up Information    Follow up with [facility] [place].   Contact information:   [address] [place] [address] [phone]       Follow up with Laban Emperor, NP.   Specialty:  Gastroenterology   Contact information:   [address] [place] [address] [phone]       Follow up with Terald Sleeper, PA-C On 03/28/2015.   Specialty:  General Practice   Why:  appointment at  2pm for evaluation of respiratory status   Contact information:   [address] [place] [address] [phone]        The results of significant diagnostics from this hospitalization (including imaging, microbiology, ancillary and laboratory) are listed below for reference.    Significant Diagnostic Studies: Dg Chest Port 1 View  03/17/2015   CLINICAL DATA:  Acute onset of shortness of breath and wheezing. Initial encounter.  EXAM: PORTABLE CHEST - 1 VIEW  COMPARISON:  Chest radiograph performed 01/07/2015  FINDINGS: The lungs are well-aerated. Mild right basilar and left-sided atelectasis or scarring are seen, with slight interstitial prominence. There is no evidence of pleural effusion or pneumothorax.  The cardiomediastinal silhouette is within normal limits. No acute osseous abnormalities are seen. Cervical spinal fusion hardware is partially imaged.  IMPRESSION: Mild right basilar and left-sided atelectasis or scarring seen, with slight interstitial prominence. This appears relatively stable from the prior study, and may reflect chronic changes of COPD. No superimposed airspace consolidation seen at this time.   Electronically  Signed   By: Garald Balding M.D.   On: 03/17/2015 17:51    Microbiology: Recent Results (from the past 240 hour(s))  Clostridium Difficile by PCR (not at [facility])     Status: None   Collection Time: 03/17/15 10:06 PM  Result Value Ref Range Status   C difficile by pcr NEGATIVE NEGATIVE Final  Culture, Urine     Status: None (Preliminary result)   Collection Time: 03/18/15  6:35 AM  Result Value Ref Range Status   Specimen Description URINE, RANDOM  Final   Special Requests NONE  Final   Culture   Final    CULTURE REINCUBATED FOR BETTER GROWTH Performed at [facility]    Report Status PENDING  Incomplete     Labs: Basic Metabolic Panel:  Recent Labs Lab 03/17/15 1729 03/18/15 0638 03/19/15 0647 03/20/15 0650  NA 136 133* 138 135  K 3.6 4.6 4.7 4.2  CL 95* 91* 95* 94*  CO2 32 33* 36* 36*  GLUCOSE 99 182* 155* 124*  BUN 5* '6 13 18  '$ CREATININE 0.88 0.83 0.90 0.81  CALCIUM 9.3 8.8* 9.4 9.2   Liver Function Tests:  Recent Labs Lab 03/18/15 0638  AST 17  ALT 9*  ALKPHOS 95  BILITOT 0.5  PROT 6.3*  ALBUMIN 3.0*   No results for input(s): LIPASE, AMYLASE in the last 168 hours. No results for input(s): AMMONIA in the last 168 hours. CBC:  Recent Labs Lab 03/17/15 1729 03/18/15 0638  WBC 6.5 5.7  NEUTROABS 4.3  --   HGB 11.9* 11.2*  HCT 38.1 36.1  MCV 89.9 89.1  PLT 322 315   Cardiac Enzymes: No results for input(s): CKTOTAL, CKMB, CKMBINDEX, TROPONINI in the last 168 hours. BNP: BNP (last 3 results)  Recent Labs  01/07/15 1757  BNP 31.0    ProBNP (last 3 results) No results for input(s): PROBNP in the last 8760 hours.  CBG: No results for input(s): GLUCAP in the last 168 hours.     SignedRadene Gunning  [facility] 03/20/2015, 10:13 AM

## 2015-03-20 NOTE — Progress Notes (Signed)
1101 Urine culture final results received this shift, multiple species present. MD notified.

## 2015-03-31 ENCOUNTER — Ambulatory Visit (INDEPENDENT_AMBULATORY_CARE_PROVIDER_SITE_OTHER): Payer: Medicare HMO | Admitting: Gastroenterology

## 2015-03-31 ENCOUNTER — Encounter: Payer: Self-pay | Admitting: Gastroenterology

## 2015-03-31 ENCOUNTER — Telehealth: Payer: Self-pay | Admitting: Gastroenterology

## 2015-03-31 ENCOUNTER — Other Ambulatory Visit: Payer: Self-pay

## 2015-03-31 VITALS — BP 125/63 | HR 94 | Temp 97.1°F | Ht 66.0 in | Wt 147.6 lb

## 2015-03-31 DIAGNOSIS — R1314 Dysphagia, pharyngoesophageal phase: Secondary | ICD-10-CM

## 2015-03-31 NOTE — Progress Notes (Signed)
Referring Provider: Terald Sleeper, PA-C Primary Care Physician:  Terald Sleeper, PA-C  Primary GI: Dr. Gala Romney   Chief Complaint  Patient presents with  . set up proceudre  . Follow-up    HPI:   Olivia Randall is a 64 y.o. female presenting today with a history of GERD and dysphagia. Last EGD in 2009 by Dr Laural Golden with small sliding hiatal hernia with mild reflux esophagitis, empiric dilation with 54/56 F, nonerosive antral gastritis. She had been scheduled for an EGD/dil with Dr. Gala Romney but was hospitalized due to COPD exacerbation. Her procedure was then cancelled. She presents to reschedule her procedure.   Persistent solid food dysphagia noted. Intermittent liquid dysphagia. Intermittent epigastric discomfort. Slacked off on [employer]. Since last visit in June 2016 but stil takesNexium daily. Intermittent GERD symptoms. Gallbladder absent.   NO prior colonoscopy. Low-volume hematochezia in past. DECLINING COLONOSCOPY until upper GI symptoms are addressed.    Past Medical History  Diagnosis Date  . COPD (chronic obstructive pulmonary disease)   . Hypertension   . Sleep apnea   . Depression   . On home O2     2L N/C  . Chronic back pain   . Anxiety and depression   . GERD (gastroesophageal reflux disease)   . Chronic neck pain   . Dementia     possible early onset Alzheimer's  . MS (multiple sclerosis)     Past Surgical History  Procedure Laterality Date  . Abdominal hysterectomy    . Bladder surgery    . Breast surgery    . Hemorrhoid surgery    . Carpal tunnel release    . Neck surgery    . Shoulder surgery Right   . Esophagogastroduodenoscopy  2009    Dr. Laural Golden: small sliding hiatal hernia with mild reflux esophagitis, empiric dilation with 54/56 F, nonerosive antral gastritis   . Cholecystectomy      Current Outpatient Prescriptions  Medication Sig Dispense Refill  . albuterol (PROAIR HFA) 108 (90 BASE) MCG/ACT inhaler Inhale 2 puffs into the lungs  every 6 (six) hours as needed for shortness of breath.     [name] albuterol (PROVENTIL) (2.5 MG/3ML) 0.083% nebulizer solution Take 2.5 mg by nebulization every 6 (six) hours as needed for shortness of breath (Mixed with Ipratropium).     . ALPRAZolam (XANAX) 1 MG tablet Take 1 mg by mouth 4 (four) times daily as needed for anxiety.     . Aspirin-Acetaminophen-Caffeine (GOODY HEADACHE PO) Take 1 Package by mouth every 4 (four) hours as needed (Pain).    . benzonatate (TESSALON) 100 MG capsule Take 1 capsule (100 mg total) by mouth 2 (two) times daily. 20 capsule 0  . clopidogrel (PLAVIX) 75 MG tablet Take 75 mg by mouth daily.     [name] donepezil (ARICEPT) 10 MG tablet Take 10 mg by mouth at bedtime.     . DULERA 100-5 MCG/ACT AERO Take 2 puffs by mouth 2 (two) times daily.    [name] esomeprazole (NEXIUM) 40 MG capsule Take 40 mg by mouth daily.    . furosemide (LASIX) 20 MG tablet Take 20 mg by mouth every morning.     [name] guaiFENesin (MUCINEX) 600 MG 12 hr tablet Take 1 tablet (600 mg total) by mouth 2 (two) times daily.    [name] ipratropium (ATROVENT) 0.02 % nebulizer solution Take 500 mcg by nebulization every 6 (six) hours as needed for wheezing (*To be mixed with Albuterol for treatments via  nebulization*).    [name] levofloxacin (LEVAQUIN) 750 MG tablet Take 1 tablet (750 mg total) by mouth daily. 5 tablet 0  . lisinopril (PRINIVIL,ZESTRIL) 10 MG tablet Take 10 mg by mouth daily.    [name] loratadine (CLARITIN) 10 MG tablet Take 10 mg by mouth every morning.     . meloxicam (MOBIC) 7.5 MG tablet Take 7.5 mg by mouth every morning.    . nicotine (NICODERM CQ - DOSED IN MG/24 HOURS) 21 mg/24hr patch Place 1 patch (21 mg total) onto the skin daily. 28 patch 0  . Oxycodone HCl 20 MG TABS Take 20 mg by mouth 4 (four) times daily as needed (for pain).     [name] PARoxetine (PAXIL) 40 MG tablet Take 40 mg by mouth every morning.    . potassium chloride (K-DUR) 10 MEQ tablet Take 1 tablet by mouth daily.    . predniSONE (DELTASONE)  10 MG tablet Take 4 tabs for 4 days starting 03/21/15 take 3 tabs for 4 days then take 2 tabs for 4 days then take 1 tab for 4 days then stop 40 tablet 0  . SPIRIVA HANDIHALER 18 MCG inhalation capsule Take 18 mcg by mouth daily.      No current facility-administered medications for this visit.    Allergies as of 03/31/2015  . (No Known Allergies)    Family History  Problem Relation Age of Onset  . Diabetes Mother   . Diabetes Father   . Diabetes Brother   . Colon cancer Neg Hx     History   Social History  . Marital Status: Married    Spouse Name: N/A  . Number of Children: N/A  . Years of Education: N/A   Social History Main Topics  . Smoking status: Current Every Day Smoker    Types: Cigarettes  . Smokeless tobacco: Not on file     Comment: 3-4 cigarettes a day  . Alcohol Use: No  . Drug Use: No  . Sexual Activity: No   Other Topics Concern  . None   Social History Narrative    Review of Systems: Gen: Denies fever, chills, anorexia. Denies fatigue, weakness, weight loss.  CV: Denies chest pain, palpitations, syncope, peripheral edema, and claudication. Resp: +DOE, +SOB at rest  GI: see HPI Derm: Denies rash, itching, dry skin Psych: Denies depression, anxiety, memory loss, confusion. No homicidal or suicidal ideation.  Heme: Denies bruising, bleeding, and enlarged lymph nodes.  Physical Exam: BP 125/63 mmHg  Pulse 94  Temp(Src) 97.1 F (36.2 C)  Ht '5\' 6"'$  (1.676 m)  Wt 147 lb 9.6 oz (66.951 kg)  BMI 23.83 kg/m2 General:   Alert and oriented. No distress noted. Pleasant and cooperative. 2 liters nasal cannula Head:  Normocephalic and atraumatic. Eyes:  Conjuctiva clear without scleral icterus. Mouth:  Oral mucosa pink and moist. Edentulous Ears: HOH  Heart:  S1, S2 present without murmurs, rubs, or gallops. Regular rate and rhythm. Lungs: mild inspiratory/expiratory wheezing bilaterally, coarse Abdomen:  +BS, soft, non-tender and non-distended. No  rebound or guarding. No HSM or masses noted. Msk:  Symmetrical without gross deformities. Normal posture. Extremities:  Without edema. Neurologic:  Alert and  oriented x4;  grossly normal neurologically. Psych:  Alert and cooperative. Normal mood and affect.

## 2015-03-31 NOTE — Telephone Encounter (Signed)
Please have patient return in 6-8 weeks after EGD to discuss colonoscopy.

## 2015-03-31 NOTE — Progress Notes (Signed)
CC'ED TO PCP 

## 2015-03-31 NOTE — Patient Instructions (Signed)
We have scheduled you for an upper endoscopy and dilation with Dr. Gala Romney in the near future.  Avoid all Goody powders.   Further recommendations to follow!

## 2015-03-31 NOTE — Assessment & Plan Note (Signed)
64 year old female with solid food and liquid dysphagia, epigastric discomfort with eating, occasional nausea, with last EGD in 2009 with mild reflux esophagitis, non-erosive antral gastritis, and empiric dilation (by Dr. Laural Golden). Query esophagitis, web, ring, stricture as culprit. Less likely malignancy. Dyspepsia may be secondary to aspirin powders leading to gastritis, PUD. Remains on Nexium. Recommend EGD with dilation in near future. As of note, no prior screening colonoscopy. Low-volume hematochezia in past likely benign but unable to exclude occult etiology. Continues to decline colonoscopy until after upper GI symptoms are addressed.   Proceed with upper endoscopy +/- dilation  in the near future with Dr. Gala Romney. The risks, benefits, and alternatives have been discussed in detail with patient. They have stated understanding and desire to proceed.  PROPOFOL due to polypharmacy Return as outpatient to arrange elective colonoscopy

## 2015-04-01 ENCOUNTER — Encounter: Payer: Self-pay | Admitting: Internal Medicine

## 2015-04-01 NOTE — Telephone Encounter (Signed)
APPOINTMENT MADE AND LETTER SENT °

## 2015-04-16 NOTE — Patient Instructions (Signed)
Olivia Randall  04/16/2015     '@[email or web address]'$ @   Your procedure is scheduled on  04/24/2015  Report to [facility] at  900  A.M.  Call this number if you have problems the morning of surgery:  [phone]   Remember:  Do not eat food or drink liquids after midnight.  Take these medicines the morning of surgery with A SIP OF WATER  Xanax, aricept, nexium, lisinopril, claritin, mobic, oxycodone, paxil, prednisone. Take your nebulizer and your inhaler before you come.   Do not wear jewelry, make-up or nail polish.  Do not wear lotions, powders, or perfumes.    Do not shave 48 hours prior to surgery.  Men may shave face and neck.  Do not bring valuables to the hospital.  [facility] is not responsible for any belongings or valuables.  Contacts, dentures or bridgework may not be worn into surgery.  Leave your suitcase in the car.  After surgery it may be brought to your room.  For patients admitted to the hospital, discharge time will be determined by your treatment team.  Patients discharged the day of surgery will not be allowed to drive home.   Name and phone number of your driver:   family Special instructions:  Follow your diet and prep instructions given to you by Dr Roseanne Kaufman office.  Please read over the following fact sheets that you were given. Pain Booklet, Coughing and Deep Breathing, Surgical Site Infection Prevention, Anesthesia Post-op Instructions and Care and Recovery After Surgery      Esophagogastroduodenoscopy Esophagogastroduodenoscopy (EGD) is a procedure to examine the lining of the esophagus, stomach, and first part of the small intestine (duodenum). A long, flexible, lighted tube with a camera attached (endoscope) is inserted down the throat to view these organs. This procedure is done to detect problems or abnormalities, such as inflammation, bleeding, ulcers, or growths, in order to treat them. The procedure lasts about 5-20 minutes. It is  usually an outpatient procedure, but it may need to be performed in emergency cases in the hospital. LET YOUR CAREGIVER KNOW ABOUT:   Allergies to food or medicine.  All medicines you are taking, including vitamins, herbs, eyedrops, and over-the-counter medicines and creams.  Use of steroids (by mouth or creams).  Previous problems you or members of your family have had with the use of anesthetics.  Any blood disorders you have.  Previous surgeries you have had.  Other health problems you have.  Possibility of pregnancy, if this applies. RISKS AND COMPLICATIONS  Generally, EGD is a safe procedure. However, as with any procedure, complications can occur. Possible complications include:  Infection.  Bleeding.  Tearing (perforation) of the esophagus, stomach, or duodenum.  Difficulty breathing or not being able to breath.  Excessive sweating.  Spasms of the larynx.  Slowed heartbeat.  Low blood pressure. BEFORE THE PROCEDURE  Do not eat or drink anything for 6-8 hours before the procedure or as directed by your caregiver.  Ask your caregiver about changing or stopping your regular medicines.  If you wear dentures, be prepared to remove them before the procedure.  Arrange for someone to drive you home after the procedure. PROCEDURE   A vein will be accessed to give medicines and fluids. A medicine to relax you (sedative) and a pain reliever will be given through that access into the vein.  A numbing medicine (local anesthetic) may be sprayed on your throat for comfort and  to stop you from gagging or coughing.  A mouth guard may be placed in your mouth to protect your teeth and to keep you from biting on the endoscope.  You will be asked to lie on your left side.  The endoscope is inserted down your throat and into the esophagus, stomach, and duodenum.  Air is put through the endoscope to allow your caregiver to view the lining of your esophagus clearly.  The  esophagus, stomach, and duodenum is then examined. During the exam, your caregiver may:  Remove tissue to be examined under a microscope (biopsy) for inflammation, infection, or other medical problems.  Remove growths.  Remove objects (foreign bodies) that are stuck.  Treat any bleeding with medicines or other devices that stop tissues from bleeding (hot cautery, clipping devices).  Widen (dilate) or stretch narrowed areas of the esophagus and stomach.  The endoscope will then be withdrawn. AFTER THE PROCEDURE  You will be taken to a recovery area to be monitored. You will be able to go home once you are stable and alert.  Do not eat or drink anything until the local anesthetic and numbing medicines have worn off. You may choke.  It is normal to feel bloated, have pain with swallowing, or have a sore throat for a short time. This will wear off.  Your caregiver should be able to discuss his or her findings with you. It will take longer to discuss the test results if any biopsies were taken. Document Released: 12/31/2004 Document Revised: 01/14/2014 Document Reviewed: 08/02/2012 [facility] Patient Information 2015 [place], [place]. This information is not intended to replace advice given to you by your health care provider. Make sure you discuss any questions you have with your health care provider. Esophageal Dilatation The esophagus is the long, narrow tube which carries food and liquid from the mouth to the stomach. Esophageal dilatation is the technique used to stretch a blocked or narrowed portion of the esophagus. This procedure is used when a part of the esophagus has become so narrow that it becomes difficult, painful or even impossible to swallow. This is generally an uncomplicated form of treatment. When this is not successful, chest surgery may be required. This is a much more extensive form of treatment with a longer recovery time. CAUSES  Some of the more common causes of blockage  or strictures of the esophagus are:  Narrowing from longstanding inflammation (soreness and redness) of the lower esophagus. This comes from the constant exposure of the lower esophagus to the acid which bubbles up from the stomach. Over time this causes scarring and narrowing of the lower esophagus.  Hiatal hernia in which a small part of the stomach bulges (herniates) up through the diaphragm. This can cause a gradual narrowing of the end of the esophagus.  Schatzki ring is a narrow ring of benign (non-cancerous) fibrous tissue which constricts the lower esophagus. The reason for this is not known.  Scleroderma is a connective tissue disorder that affects the esophagus and makes swallowing difficult.  Achalasia is an absence of nerves to the lower esophagus and to the esophageal sphincter. This is the circular muscle between the stomach and esophagus that relaxes to allow food into the stomach. After swallowing, it contracts to keep food in the stomach. This absence of nerves may be congenital (present since birth). This can cause irregular spasms of the lower esophageal muscle. This spasm does not open up to allow food and fluid through. The result is a persistent  blockage with subsequent slow trickling of the esophageal contents into the stomach.  Strictures may develop from swallowing materials which damage the esophagus. Some examples are strong acids or alkalis such as lye.  Growths such as benign (non-cancerous) and malignant (cancerous) tumors can block the esophagus.  Hereditary (present since birth) causes. DIAGNOSIS  Your caregiver often suspects this problem by taking a medical history. They will also do a physical exam. They can then prove their suspicions using X-rays and endoscopy. Endoscopy is an exam in which a tube like a small, flexible telescope is used to look at your esophagus.  TREATMENT There are different stretching (dilating) techniques that can be used. Simple bougie  dilatation may be done in the office. This usually takes only a couple minutes. A numbing (anesthetic) spray of the throat is used. Endoscopy, when done, is done in an endoscopy suite under mild sedation. When fluoroscopy is used, the procedure is performed in X-ray. Other techniques require a little longer time. Recovery is usually quick. There is no waiting time to begin eating and drinking to test success of the treatment. Following are some of the methods used. Narrowing of the esophagus is treated by making it bigger. Commonly this is a mechanical problem which can be treated with stretching. This can be done in different ways. Your caregiver will discuss these with you. Some of the means used are:  A series of graduated (increasing thickness) flexible dilators can be used. These are weighted tubes passed through the esophagus into the stomach. The tubes used become progressively larger until the desired stretched size is reached. Graduated dilators are a simple and quick way of opening the esophagus. No visualization is required.  Another method is the use of endoscopy to place a flexible wire across the stricture. The endoscope is removed and the wire left in place. A dilator with a hole through it from end to end is guided down the esophagus and across the stricture. One or more of these dilators are passed over the wire. At the end of the exam, the wire is removed. This type of treatment may be performed in the X-ray department under fluoroscopy. An advantage of this procedure is the examiner is visualizing the end opening in the esophagus.  Stretching of the esophagus may be done using balloons. Deflated balloons are placed through the endoscope and across the stricture. This type of balloon dilatation is often done at the time of endoscopy or fluoroscopy. Flexible endoscopy allows the examiner to directly view the stricture. A balloon is inserted in the deflated form into the area of narrowing. It  is then inflated with air to a certain pressure that is preset for a given circumference. When inflated, it becomes sausage shaped, stretched, and makes the stricture larger.  Achalasia requires a longer, larger balloon-type dilator. This is frequently done under X-ray control. In this situation, the spastic muscle fibers in the lower esophagus are stretched. All of the above procedures make the passage of food and water into the stomach easier. They also make it easier for stomach contents to reflux back into the esophagus. Special medications may be used following the procedure to help prevent further stricturing. Proton-pump inhibitor medications are good at decreasing the amount of acid in the stomach juice. When stomach juice refluxes into the esophagus, the juice is no longer as acidic and is less likely to burn or scar the esophagus. RISKS AND COMPLICATIONS Esophageal dilatation is usually performed effectively and without problems. Some complications  that can occur are:  A small amount of bleeding almost always happens where the stretching takes place. If this is too excessive it may require more aggressive treatment.  An uncommon complication is perforation (making a hole) of the esophagus. The esophagus is thin. It is easy to make a hole in it. If this happens, an operation may be necessary to repair this.  A small, undetected perforation could lead to an infection in the chest. This can be very serious. HOME CARE INSTRUCTIONS   If you received sedation for your procedure, do not drive, make important decisions, or perform any activities requiring your full coordination. Do not drink alcohol, take sedatives, or use any mind altering chemicals unless instructed by your caregiver.  You may use throat lozenges or warm salt water gargles if you have throat discomfort.  You can begin eating and drinking normally on return home unless instructed otherwise. Do not purposely try to force large  chunks of food down to test the benefits of your procedure.  Mild discomfort can be eased with sips of ice water.  Medications for discomfort may or may not be needed. SEEK IMMEDIATE MEDICAL CARE IF:   You begin vomiting up blood.  You develop black, tarry stools.  You develop chills or an unexplained temperature of over 101F (38.3C)  You develop chest or abdominal pain.  You develop shortness of breath, or feel light-headed or faint.  Your swallowing is becoming more painful, difficult, or you are unable to swallow. MAKE SURE YOU:   Understand these instructions.  Will watch your condition.  Will get help right away if you are not doing well or get worse. Document Released: 10/21/2005 Document Revised: 01/14/2014 Document Reviewed: 12/08/2005 [facility] Patient Information 2015 [place], [place]. This information is not intended to replace advice given to you by your health care provider. Make sure you discuss any questions you have with your health care provider. PATIENT INSTRUCTIONS POST-ANESTHESIA  IMMEDIATELY FOLLOWING SURGERY:  Do not drive or operate machinery for the first twenty four hours after surgery.  Do not make any important decisions for twenty four hours after surgery or while taking narcotic pain medications or sedatives.  If you develop intractable nausea and vomiting or a severe headache please notify your doctor immediately.  FOLLOW-UP:  Please make an appointment with your surgeon as instructed. You do not need to follow up with anesthesia unless specifically instructed to do so.  WOUND CARE INSTRUCTIONS (if applicable):  Keep a dry clean dressing on the anesthesia/puncture wound site if there is drainage.  Once the wound has quit draining you may leave it open to air.  Generally you should leave the bandage intact for twenty four hours unless there is drainage.  If the epidural site drains for more than 36-48 hours please call the anesthesia  department.  QUESTIONS?:  Please feel free to call your physician or the hospital operator if you have any questions, and they will be happy to assist you.

## 2015-04-18 ENCOUNTER — Encounter (HOSPITAL_COMMUNITY): Payer: Self-pay

## 2015-04-18 ENCOUNTER — Encounter (HOSPITAL_COMMUNITY)
Admission: RE | Admit: 2015-04-18 | Discharge: 2015-04-18 | Disposition: A | Discharge status: Home / Self Care | Payer: Medicare HMO | Source: Ambulatory Visit | Attending: Internal Medicine | Admitting: Internal Medicine

## 2015-04-18 DIAGNOSIS — Z01818 Encounter for other preprocedural examination: Secondary | ICD-10-CM | POA: Insufficient documentation

## 2015-04-18 HISTORY — DX: Unspecified asthma, uncomplicated: J45.909

## 2015-04-18 HISTORY — DX: Reserved for inherently not codable concepts without codable children: IMO0001

## 2015-04-18 LAB — BASIC METABOLIC PANEL
ANION GAP: 8 (ref 5–15)
BUN: 6 mg/dL (ref 6–20)
CALCIUM: 9.4 mg/dL (ref 8.9–10.3)
CO2: 32 mmol/L (ref 22–32)
CREATININE: 0.88 mg/dL (ref 0.44–1.00)
Chloride: 96 mmol/L — ABNORMAL LOW (ref 101–111)
GFR calc non Af Amer: >60 mL/min (ref >60)
Glucose, Bld: 69 mg/dL (ref 65–99)
Potassium: 4.7 mmol/L (ref 3.5–5.1)
Sodium: 136 mmol/L (ref 135–145)

## 2015-04-18 LAB — CBC WITH DIFFERENTIAL/PLATELET
BASOS PCT: 0 % (ref 0–1)
Basophils Absolute: 0 10*3/uL (ref 0.0–0.1)
Eosinophils Absolute: 0.3 10*3/uL (ref 0.0–0.7)
Eosinophils Relative: 5 % (ref 0–5)
HEMATOCRIT: 38.5 % (ref 36.0–46.0)
Hemoglobin: 11.9 g/dL — ABNORMAL LOW (ref 12.0–15.0)
LYMPHS PCT: 21 % (ref 12–46)
Lymphs Abs: 1.3 10*3/uL (ref 0.7–4.0)
MCH: 28.1 pg (ref 26.0–34.0)
MCHC: 30.9 g/dL (ref 30.0–36.0)
MCV: 90.8 fL (ref 78.0–100.0)
Monocytes Absolute: 0.7 10*3/uL (ref 0.1–1.0)
Monocytes Relative: 12 % (ref 3–12)
Neutro Abs: 3.6 10*3/uL (ref 1.7–7.7)
Neutrophils Relative %: 62 % (ref 43–77)
PLATELETS: 257 10*3/uL (ref 150–400)
RBC: 4.24 MIL/uL (ref 3.87–5.11)
RDW: 14.9 % (ref 11.5–15.5)
WBC: 5.9 10*3/uL (ref 4.0–10.5)

## 2015-04-24 ENCOUNTER — Telehealth: Payer: Self-pay

## 2015-04-24 ENCOUNTER — Ambulatory Visit (HOSPITAL_COMMUNITY): Payer: Medicare HMO | Admitting: Anesthesiology

## 2015-04-24 ENCOUNTER — Encounter (HOSPITAL_COMMUNITY)
Admission: RE | Disposition: A | Discharge status: Home / Self Care | Payer: Self-pay | Source: Ambulatory Visit | Attending: Internal Medicine

## 2015-04-24 ENCOUNTER — Encounter (HOSPITAL_COMMUNITY): Payer: Self-pay | Admitting: *Deleted

## 2015-04-24 ENCOUNTER — Ambulatory Visit (HOSPITAL_COMMUNITY)
Admission: RE | Admit: 2015-04-24 | Discharge: 2015-04-24 | Disposition: A | Discharge status: Home / Self Care | Payer: Medicare HMO | Source: Ambulatory Visit | Attending: Internal Medicine | Admitting: Internal Medicine

## 2015-04-24 DIAGNOSIS — F329 Major depressive disorder, single episode, unspecified: Secondary | ICD-10-CM | POA: Insufficient documentation

## 2015-04-24 DIAGNOSIS — Z9981 Dependence on supplemental oxygen: Secondary | ICD-10-CM | POA: Diagnosis not present

## 2015-04-24 DIAGNOSIS — J449 Chronic obstructive pulmonary disease, unspecified: Secondary | ICD-10-CM | POA: Insufficient documentation

## 2015-04-24 DIAGNOSIS — G8929 Other chronic pain: Secondary | ICD-10-CM | POA: Insufficient documentation

## 2015-04-24 DIAGNOSIS — K3189 Other diseases of stomach and duodenum: Secondary | ICD-10-CM | POA: Insufficient documentation

## 2015-04-24 DIAGNOSIS — G4733 Obstructive sleep apnea (adult) (pediatric): Secondary | ICD-10-CM | POA: Diagnosis not present

## 2015-04-24 DIAGNOSIS — M549 Dorsalgia, unspecified: Secondary | ICD-10-CM | POA: Diagnosis not present

## 2015-04-24 DIAGNOSIS — J9610 Chronic respiratory failure, unspecified whether with hypoxia or hypercapnia: Secondary | ICD-10-CM | POA: Insufficient documentation

## 2015-04-24 DIAGNOSIS — K210 Gastro-esophageal reflux disease with esophagitis: Secondary | ICD-10-CM | POA: Diagnosis not present

## 2015-04-24 DIAGNOSIS — G35 Multiple sclerosis: Secondary | ICD-10-CM | POA: Diagnosis not present

## 2015-04-24 DIAGNOSIS — I10 Essential (primary) hypertension: Secondary | ICD-10-CM | POA: Diagnosis not present

## 2015-04-24 DIAGNOSIS — F17210 Nicotine dependence, cigarettes, uncomplicated: Secondary | ICD-10-CM | POA: Insufficient documentation

## 2015-04-24 DIAGNOSIS — F0390 Unspecified dementia without behavioral disturbance: Secondary | ICD-10-CM | POA: Insufficient documentation

## 2015-04-24 DIAGNOSIS — R1314 Dysphagia, pharyngoesophageal phase: Secondary | ICD-10-CM | POA: Diagnosis not present

## 2015-04-24 DIAGNOSIS — K311 Adult hypertrophic pyloric stenosis: Secondary | ICD-10-CM | POA: Insufficient documentation

## 2015-04-24 DIAGNOSIS — R1310 Dysphagia, unspecified: Secondary | ICD-10-CM | POA: Diagnosis present

## 2015-04-24 DIAGNOSIS — M542 Cervicalgia: Secondary | ICD-10-CM | POA: Diagnosis not present

## 2015-04-24 DIAGNOSIS — Z90710 Acquired absence of both cervix and uterus: Secondary | ICD-10-CM | POA: Insufficient documentation

## 2015-04-24 HISTORY — PX: BIOPSY: SHX5522

## 2015-04-24 HISTORY — PX: ESOPHAGOGASTRODUODENOSCOPY (EGD) WITH PROPOFOL: SHX5813

## 2015-04-24 HISTORY — PX: MALONEY DILATION: SHX5535

## 2015-04-24 SURGERY — ESOPHAGOGASTRODUODENOSCOPY (EGD) WITH PROPOFOL
Anesthesia: Monitor Anesthesia Care

## 2015-04-24 MED ORDER — FENTANYL CITRATE (PF) 100 MCG/2ML IJ SOLN
INTRAMUSCULAR | Status: AC
  Filled 2015-04-24: qty 2

## 2015-04-24 MED ORDER — LIDOCAINE VISCOUS 2 % MT SOLN
15.0000 mL | Freq: Once | OROMUCOSAL | Status: AC
  Administered 2015-04-24: 15 mL via OROMUCOSAL

## 2015-04-24 MED ORDER — LIDOCAINE VISCOUS 2 % MT SOLN
OROMUCOSAL | Status: AC
  Filled 2015-04-24: qty 15

## 2015-04-24 MED ORDER — STERILE WATER FOR IRRIGATION IR SOLN
Status: DC | PRN
  Administered 2015-04-24: 1000 mL

## 2015-04-24 MED ORDER — MIDAZOLAM HCL 2 MG/2ML IJ SOLN
1.0000 mg | INTRAMUSCULAR | Status: DC | PRN
  Administered 2015-04-24: 2 mg via INTRAVENOUS

## 2015-04-24 MED ORDER — MIDAZOLAM HCL 5 MG/5ML IJ SOLN
INTRAMUSCULAR | Status: DC | PRN
  Administered 2015-04-24: 2 mg via INTRAVENOUS

## 2015-04-24 MED ORDER — LACTATED RINGERS IV SOLN
INTRAVENOUS | Status: DC
  Administered 2015-04-24: 11:00:00 via INTRAVENOUS

## 2015-04-24 MED ORDER — FENTANYL CITRATE (PF) 100 MCG/2ML IJ SOLN
25.0000 ug | INTRAMUSCULAR | Status: AC
  Administered 2015-04-24 (×2): 25 ug via INTRAVENOUS

## 2015-04-24 MED ORDER — ONDANSETRON HCL 4 MG/2ML IJ SOLN
INTRAMUSCULAR | Status: AC
  Filled 2015-04-24: qty 2

## 2015-04-24 MED ORDER — PROPOFOL INFUSION 10 MG/ML OPTIME
INTRAVENOUS | Status: DC | PRN
  Administered 2015-04-24: 75 ug/kg/min via INTRAVENOUS

## 2015-04-24 MED ORDER — MIDAZOLAM HCL 2 MG/2ML IJ SOLN
INTRAMUSCULAR | Status: AC
  Filled 2015-04-24: qty 4

## 2015-04-24 MED ORDER — GLYCOPYRROLATE 0.2 MG/ML IJ SOLN
INTRAMUSCULAR | Status: AC
  Filled 2015-04-24: qty 1

## 2015-04-24 MED ORDER — ONDANSETRON HCL 4 MG/2ML IJ SOLN
4.0000 mg | Freq: Once | INTRAMUSCULAR | Status: AC
  Administered 2015-04-24: 4 mg via INTRAVENOUS

## 2015-04-24 MED ORDER — MIDAZOLAM HCL 2 MG/2ML IJ SOLN
INTRAMUSCULAR | Status: AC
  Filled 2015-04-24: qty 2

## 2015-04-24 MED ORDER — GLYCOPYRROLATE 0.2 MG/ML IJ SOLN
0.2000 mg | Freq: Once | INTRAMUSCULAR | Status: AC
  Administered 2015-04-24: 0.2 mg via INTRAVENOUS

## 2015-04-24 SURGICAL SUPPLY — 11 items
BLOCK BITE 60FR ADLT L/F BLUE (MISCELLANEOUS) ×3 IMPLANT
FLOOR PAD 36X40 (MISCELLANEOUS) ×3
FORCEPS BIOP RAD 4 LRG CAP 4 (CUTTING FORCEPS) ×3 IMPLANT
FORMALIN 10 PREFIL 20ML (MISCELLANEOUS) ×3 IMPLANT
KIT ENDO PROCEDURE PEN (KITS) ×3 IMPLANT
MANIFOLD NEPTUNE II (INSTRUMENTS) ×3 IMPLANT
PAD FLOOR 36X40 (MISCELLANEOUS) ×2 IMPLANT
SYR 50ML LL SCALE MARK (SYRINGE) ×3 IMPLANT
TUBING ENDO SMARTCAP PENTAX (MISCELLANEOUS) ×3 IMPLANT
TUBING IRRIGATION ENDOGATOR (MISCELLANEOUS) ×3 IMPLANT
WATER STERILE IRR 1000ML POUR (IV SOLUTION) ×3 IMPLANT

## 2015-04-24 NOTE — Anesthesia Postprocedure Evaluation (Signed)
  Anesthesia Post-op Note  Patient: Debra Colon [facility]  Procedure(s) Performed: Procedure(s): ESOPHAGOGASTRODUODENOSCOPY (EGD) WITH PROPOFOL (N/A) MALONEY ESOPHAGEAL DILATION (54FR) (N/A) BIOPSY (Gastric)  Patient Location: PACU  Anesthesia Type:MAC  Level of Consciousness: awake, alert , oriented and patient cooperative  Airway and Oxygen Therapy: Patient Spontanous Breathing  Post-op Pain: none  Post-op Assessment: Post-op Vital signs reviewed, Patient's Cardiovascular Status Stable, Respiratory Function Stable, Patent Airway, No signs of Nausea or vomiting and Pain level controlled              Post-op Vital Signs: Reviewed and stable  Last Vitals:  Filed Vitals:   04/24/15 1128  BP:   Pulse:   Temp: 36.7 C  Resp:     Complications: No apparent anesthesia complications

## 2015-04-24 NOTE — Telephone Encounter (Signed)
thanks

## 2015-04-24 NOTE — Anesthesia Preprocedure Evaluation (Signed)
Anesthesia Evaluation  Patient identified by MRN, date of birth, ID band Patient awake    Reviewed: Allergy & Precautions, NPO status , Patient's Chart, lab work & pertinent test results  Airway Mallampati: II  TM Distance: >3 FB     Dental  (+) Edentulous Upper, Edentulous Lower   Pulmonary shortness of breath and with exertion, asthma , sleep apnea , COPD COPD inhaler, Current Smoker,  breath sounds clear to auscultation        Cardiovascular hypertension, Pt. on medications Rhythm:Regular Rate:Normal     Neuro/Psych PSYCHIATRIC DISORDERS Depression    GI/Hepatic GERD-  ,  Endo/Other    Renal/GU      Musculoskeletal   Abdominal   Peds  Hematology   Anesthesia Other Findings   Reproductive/Obstetrics                             Anesthesia Physical Anesthesia Plan  ASA: III  Anesthesia Plan: MAC   Post-op Pain Management:    Induction: Intravenous  Airway Management Planned: Simple Face Mask  Additional Equipment:   Intra-op Plan:   Post-operative Plan:   Informed Consent: I have reviewed the patients History and Physical, chart, labs and discussed the procedure including the risks, benefits and alternatives for the proposed anesthesia with the patient or authorized representative who has indicated his/her understanding and acceptance.     Plan Discussed with:   Anesthesia Plan Comments:         Anesthesia Quick Evaluation

## 2015-04-24 NOTE — Discharge Instructions (Signed)
EGD Discharge instructions Please read the instructions outlined below and refer to this sheet in the next few weeks. These discharge instructions provide you with general information on caring for yourself after you leave the hospital. Your doctor may also give you specific instructions. While your treatment has been planned according to the most current medical practices available, unavoidable complications occasionally occur. If you have any problems or questions after discharge, please call your doctor. ACTIVITY  You may resume your regular activity but move at a slower pace for the next 24 hours.   Take frequent rest periods for the next 24 hours.   Walking will help expel (get rid of) the air and reduce the bloated feeling in your abdomen.   No driving for 24 hours (because of the anesthesia (medicine) used during the test).   You may shower.   Do not sign any important legal documents or operate any machinery for 24 hours (because of the anesthesia used during the test).  NUTRITION  Drink plenty of fluids.   You may resume your normal diet.   Begin with a light meal and progress to your normal diet.   Avoid alcoholic beverages for 24 hours or as instructed by your caregiver.  MEDICATIONS  You may resume your normal medications unless your caregiver tells you otherwise.  WHAT YOU CAN EXPECT TODAY  You may experience abdominal discomfort such as a feeling of fullness or gas pains.  FOLLOW-UP  Your doctor will discuss the results of your test with you.  SEEK IMMEDIATE MEDICAL ATTENTION IF ANY OF THE FOLLOWING OCCUR:  Excessive nausea (feeling sick to your stomach) and/or vomiting.   Severe abdominal pain and distention (swelling).   Trouble swallowing.   Temperature over 101 F (37.8 C).   Rectal bleeding or vomiting of blood.     GERD information provided  Stop Nexium; Begin Dexilant 60 mg daily-go by my office for 3 week supply of samples  Office visit  with [place] in 3 months to see how you're doing and set up your colonoscopy  Further recommendations to follow pending review of pathology report  Gastroesophageal Reflux Disease, Adult Gastroesophageal reflux disease (GERD) happens when acid from your stomach flows up into the esophagus. When acid comes in contact with the esophagus, the acid causes soreness (inflammation) in the esophagus. Over time, GERD may create small holes (ulcers) in the lining of the esophagus. CAUSES   Increased body weight. This puts pressure on the stomach, making acid rise from the stomach into the esophagus.  Smoking. This increases acid production in the stomach.  Drinking alcohol. This causes decreased pressure in the lower esophageal sphincter (valve or ring of muscle between the esophagus and stomach), allowing acid from the stomach into the esophagus.  Late evening meals and a full stomach. This increases pressure and acid production in the stomach.  A malformed lower esophageal sphincter. Sometimes, no cause is found. SYMPTOMS   Burning pain in the lower part of the mid-chest behind the breastbone and in the mid-stomach area. This may occur twice a week or more often.  Trouble swallowing.  Sore throat.  Dry cough.  Asthma-like symptoms including chest tightness, shortness of breath, or wheezing. DIAGNOSIS  Your caregiver may be able to diagnose GERD based on your symptoms. In some cases, X-rays and other tests may be done to check for complications or to check the condition of your stomach and esophagus. TREATMENT  Your caregiver may recommend over-the-counter or prescription medicines to help  decrease acid production. Ask your caregiver before starting or adding any new medicines.  HOME CARE INSTRUCTIONS   Change the factors that you can control. Ask your caregiver for guidance concerning weight loss, quitting smoking, and alcohol consumption.  Avoid foods and drinks that make your symptoms  worse, such as:  Caffeine or alcoholic drinks.  Chocolate.  Peppermint or mint flavorings.  Garlic and onions.  Spicy foods.  Citrus fruits, such as oranges, lemons, or limes.  Tomato-based foods such as sauce, chili, salsa, and pizza.  Fried and fatty foods.  Avoid lying down for the 3 hours prior to your bedtime or prior to taking a nap.  Eat small, frequent meals instead of large meals.  Wear loose-fitting clothing. Do not wear anything tight around your waist that causes pressure on your stomach.  Raise the head of your bed 6 to 8 inches with wood blocks to help you sleep. Extra pillows will not help.  Only take over-the-counter or prescription medicines for pain, discomfort, or fever as directed by your caregiver.  Do not take aspirin, ibuprofen, or other nonsteroidal anti-inflammatory drugs (NSAIDs). SEEK IMMEDIATE MEDICAL CARE IF:   You have pain in your arms, neck, jaw, teeth, or back.  Your pain increases or changes in intensity or duration.  You develop nausea, vomiting, or sweating (diaphoresis).  You develop shortness of breath, or you faint.  Your vomit is green, yellow, black, or looks like coffee grounds or blood.  Your stool is red, bloody, or black. These symptoms could be signs of other problems, such as heart disease, gastric bleeding, or esophageal bleeding. MAKE SURE YOU:   Understand these instructions.  Will watch your condition.  Will get help right away if you are not doing well or get worse. Document Released: 06/09/2005 Document Revised: 11/22/2011 Document Reviewed: 03/19/2011 [facility] Patient Information 2015 [place], [place]. This information is not intended to replace advice given to you by your health care provider. Make sure you discuss any questions you have with your health care provider.

## 2015-04-24 NOTE — H&P (View-Only) (Signed)
Referring Provider: Terald Sleeper, PA-C Primary Care Physician:  Terald Sleeper, PA-C  Primary GI: Dr. Gala Romney   Chief Complaint  Patient presents with  . set up proceudre  . Follow-up    HPI:   Olivia Randall is a 64 y.o. female presenting today with a history of GERD and dysphagia. Last EGD in 2009 by Dr Laural Golden with small sliding hiatal hernia with mild reflux esophagitis, empiric dilation with 54/56 F, nonerosive antral gastritis. She had been scheduled for an EGD/dil with Dr. Gala Romney but was hospitalized due to COPD exacerbation. Her procedure was then cancelled. She presents to reschedule her procedure.   Persistent solid food dysphagia noted. Intermittent liquid dysphagia. Intermittent epigastric discomfort. Slacked off on [employer]. Since last visit in June 2016 but stil takesNexium daily. Intermittent GERD symptoms. Gallbladder absent.   NO prior colonoscopy. Low-volume hematochezia in past. DECLINING COLONOSCOPY until upper GI symptoms are addressed.    Past Medical History  Diagnosis Date  . COPD (chronic obstructive pulmonary disease)   . Hypertension   . Sleep apnea   . Depression   . On home O2     2L N/C  . Chronic back pain   . Anxiety and depression   . GERD (gastroesophageal reflux disease)   . Chronic neck pain   . Dementia     possible early onset Alzheimer's  . MS (multiple sclerosis)     Past Surgical History  Procedure Laterality Date  . Abdominal hysterectomy    . Bladder surgery    . Breast surgery    . Hemorrhoid surgery    . Carpal tunnel release    . Neck surgery    . Shoulder surgery Right   . Esophagogastroduodenoscopy  2009    Dr. Laural Golden: small sliding hiatal hernia with mild reflux esophagitis, empiric dilation with 54/56 F, nonerosive antral gastritis   . Cholecystectomy      Current Outpatient Prescriptions  Medication Sig Dispense Refill  . albuterol (PROAIR HFA) 108 (90 BASE) MCG/ACT inhaler Inhale 2 puffs into the lungs  every 6 (six) hours as needed for shortness of breath.     [name] albuterol (PROVENTIL) (2.5 MG/3ML) 0.083% nebulizer solution Take 2.5 mg by nebulization every 6 (six) hours as needed for shortness of breath (Mixed with Ipratropium).     . ALPRAZolam (XANAX) 1 MG tablet Take 1 mg by mouth 4 (four) times daily as needed for anxiety.     . Aspirin-Acetaminophen-Caffeine (GOODY HEADACHE PO) Take 1 Package by mouth every 4 (four) hours as needed (Pain).    . benzonatate (TESSALON) 100 MG capsule Take 1 capsule (100 mg total) by mouth 2 (two) times daily. 20 capsule 0  . clopidogrel (PLAVIX) 75 MG tablet Take 75 mg by mouth daily.     [name] donepezil (ARICEPT) 10 MG tablet Take 10 mg by mouth at bedtime.     . DULERA 100-5 MCG/ACT AERO Take 2 puffs by mouth 2 (two) times daily.    [name] esomeprazole (NEXIUM) 40 MG capsule Take 40 mg by mouth daily.    . furosemide (LASIX) 20 MG tablet Take 20 mg by mouth every morning.     [name] guaiFENesin (MUCINEX) 600 MG 12 hr tablet Take 1 tablet (600 mg total) by mouth 2 (two) times daily.    [name] ipratropium (ATROVENT) 0.02 % nebulizer solution Take 500 mcg by nebulization every 6 (six) hours as needed for wheezing (*To be mixed with Albuterol for treatments via  nebulization*).    [name] levofloxacin (LEVAQUIN) 750 MG tablet Take 1 tablet (750 mg total) by mouth daily. 5 tablet 0  . lisinopril (PRINIVIL,ZESTRIL) 10 MG tablet Take 10 mg by mouth daily.    [name] loratadine (CLARITIN) 10 MG tablet Take 10 mg by mouth every morning.     . meloxicam (MOBIC) 7.5 MG tablet Take 7.5 mg by mouth every morning.    . nicotine (NICODERM CQ - DOSED IN MG/24 HOURS) 21 mg/24hr patch Place 1 patch (21 mg total) onto the skin daily. 28 patch 0  . Oxycodone HCl 20 MG TABS Take 20 mg by mouth 4 (four) times daily as needed (for pain).     [name] PARoxetine (PAXIL) 40 MG tablet Take 40 mg by mouth every morning.    . potassium chloride (K-DUR) 10 MEQ tablet Take 1 tablet by mouth daily.    . predniSONE (DELTASONE)  10 MG tablet Take 4 tabs for 4 days starting 03/21/15 take 3 tabs for 4 days then take 2 tabs for 4 days then take 1 tab for 4 days then stop 40 tablet 0  . SPIRIVA HANDIHALER 18 MCG inhalation capsule Take 18 mcg by mouth daily.      No current facility-administered medications for this visit.    Allergies as of 03/31/2015  . (No Known Allergies)    Family History  Problem Relation Age of Onset  . Diabetes Mother   . Diabetes Father   . Diabetes Brother   . Colon cancer Neg Hx     History   Social History  . Marital Status: Married    Spouse Name: N/A  . Number of Children: N/A  . Years of Education: N/A   Social History Main Topics  . Smoking status: Current Every Day Smoker    Types: Cigarettes  . Smokeless tobacco: Not on file     Comment: 3-4 cigarettes a day  . Alcohol Use: No  . Drug Use: No  . Sexual Activity: No   Other Topics Concern  . None   Social History Narrative    Review of Systems: Gen: Denies fever, chills, anorexia. Denies fatigue, weakness, weight loss.  CV: Denies chest pain, palpitations, syncope, peripheral edema, and claudication. Resp: +DOE, +SOB at rest  GI: see HPI Derm: Denies rash, itching, dry skin Psych: Denies depression, anxiety, memory loss, confusion. No homicidal or suicidal ideation.  Heme: Denies bruising, bleeding, and enlarged lymph nodes.  Physical Exam: BP 125/63 mmHg  Pulse 94  Temp(Src) 97.1 F (36.2 C)  Ht '5\' 6"'$  (1.676 m)  Wt 147 lb 9.6 oz (66.951 kg)  BMI 23.83 kg/m2 General:   Alert and oriented. No distress noted. Pleasant and cooperative. 2 liters nasal cannula Head:  Normocephalic and atraumatic. Eyes:  Conjuctiva clear without scleral icterus. Mouth:  Oral mucosa pink and moist. Edentulous Ears: HOH  Heart:  S1, S2 present without murmurs, rubs, or gallops. Regular rate and rhythm. Lungs: mild inspiratory/expiratory wheezing bilaterally, coarse Abdomen:  +BS, soft, non-tender and non-distended. No  rebound or guarding. No HSM or masses noted. Msk:  Symmetrical without gross deformities. Normal posture. Extremities:  Without edema. Neurologic:  Alert and  oriented x4;  grossly normal neurologically. Psych:  Alert and cooperative. Normal mood and affect.

## 2015-04-24 NOTE — Transfer of Care (Signed)
Immediate Anesthesia Transfer of Care Note  Patient: Danniela Mcbrearty [facility]  Procedure(s) Performed: Procedure(s): ESOPHAGOGASTRODUODENOSCOPY (EGD) WITH PROPOFOL (N/A) MALONEY ESOPHAGEAL DILATION (54FR) (N/A) BIOPSY (Gastric)  Patient Location: PACU  Anesthesia Type:MAC  Level of Consciousness: awake and patient cooperative  Airway & Oxygen Therapy: Patient Spontanous Breathing and Patient connected to face mask oxygen  Post-op Assessment: Report given to RN, Post -op Vital signs reviewed and stable and Patient moving all extremities  Post vital signs: Reviewed and stable  Last Vitals:  Filed Vitals:   04/24/15 1055  BP: 103/52  Pulse:   Temp:   Resp: 17    Complications: No apparent anesthesia complications

## 2015-04-24 NOTE — Op Note (Signed)
[facility] [address] [place], [address]   ENDOSCOPY PROCEDURE REPORT  PATIENT: Torri, Michalski  MR#: [id number] BIRTHDATE: 01-14-1951 , 89  yrs. old GENDER: female ENDOSCOPIST: R.  Garfield Cornea, MD [facility] REFERRED BY:  Particia Nearing, PA-C PROCEDURE DATE:  May 23, 2015 PROCEDURE:  EGD with Venia Minks dilation of esophagus  and gastric biopsy INDICATIONS:  esophageal dysphagia; refractory GERD. MEDICATIONS: Deep sedation per Dr.  [facility] ASA CLASS:      Class III  CONSENT: The risks, benefits, limitations, alternatives and imponderables have been discussed.  The potential for biopsy, esophogeal dilation, etc. have also been reviewed.  Questions have been answered.  All parties agreeable.  Please see the history and physical in the medical record for more information.  DESCRIPTION OF PROCEDURE: After the risks benefits and alternatives of the procedure were thoroughly explained, informed consent was obtained.  The    endoscope was introduced through the mouth and advanced to the second portion of the duodenum , limited by Without limitations. The instrument was slowly withdrawn as the mucosa was fully examined. Estimated blood loss is zero unless otherwise noted in this procedure report.    Normal-appearing, patent tubular esophagus.  Some retained gastric contents (last intake of solid food reportedly 18 hours prior to this procedure).  Pyloric channel appeared stenotic and somewhat fixed..  I did apply significant pressure through the gastroscope to traverse the channel.  When this was done I noted the pyloric channel was somewhat friable and stenotic but it opened up with passage of the scope.  Examination of the bulb and second portion revealed no abnormalities.  I did not see an ulcer or infiltrating process anywhere. Retained gastric contents easily lavaged and suctioned out. Scope was withdrawn and a 54 [place] Maloney dilator was  passed to full insertion easily.  A look back revealed no apparent complication related to this maneuver.  Subsequently, I biopsied the antral/prepyloric mucosa.  Retroflexed views revealed as previously described.     The scope was then withdrawn from the patient and the procedure completed. EBL 5 mL COMPLICATIONS: There were no immediate complications.  ENDOSCOPIC IMPRESSION: Normal esophagus?"status post Maloney dilation. 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 Follow up on pathology. Office visit in 3 months to reassess and set up her first ever colonoscopy  REPEAT EXAM:  eSigned:  R. Garfield Cornea, MD Rosalita Chessman [facility] 05-23-2015 11:28 AM    CC:  CPT CODES: ICD CODES:  The ICD and CPT codes recommended by this software are interpretations from the data that the clinical staff has captured with the software.  The verification of the translation of this report to the ICD and CPT codes and modifiers is the sole responsibility of the health care institution and practicing physician where this report was generated.  [employer] will not be held responsible for the validity of the ICD and CPT codes included on this report.  AMA assumes no liability for data contained or not contained herein. CPT is a Designer, television/film set of the [employer].  PATIENT NAME:  Eron, Goble MR#: [id number]

## 2015-04-24 NOTE — Interval H&P Note (Signed)
History and Physical Interval Note:  04/24/2015 10:50 AM  [place]  has presented today for surgery, with the diagnosis of DYSPHAGIA  The various methods of treatment have been discussed with the patient and family. After consideration of risks, benefits and other options for treatment, the patient has consented to  Procedure(s): ESOPHAGOGASTRODUODENOSCOPY (EGD) WITH PROPOFOL (N/A) [place] (N/A) as a surgical intervention .  The patient's history has been reviewed, patient examined, no change in status, stable for surgery.  I have reviewed the patient's chart and labs.  Questions were answered to the patient's satisfaction.     Cindy Brindisi  No change. Patient notes frequent, nearly daily, breakthrough reflux symptoms in spite of taking Nexium 40 mg daily. EGD with esophageal dilation as appropriate/feasible today per plan.  The risks, benefits, limitations, alternatives and imponderables have been reviewed with the patient. Potential for esophageal dilation, biopsy, etc. have also been reviewed.  Questions have been answered. All parties agreeable.

## 2015-04-24 NOTE — Telephone Encounter (Signed)
Dexilant 60 mg #20 given to pt per Dr. Gala Romney to take one tablet daily.

## 2015-04-25 ENCOUNTER — Encounter (HOSPITAL_COMMUNITY): Payer: Self-pay | Admitting: Internal Medicine

## 2015-04-26 ENCOUNTER — Encounter: Payer: Self-pay | Admitting: Internal Medicine

## 2015-06-25 ENCOUNTER — Telehealth: Payer: Self-pay | Admitting: Gastroenterology

## 2015-06-25 ENCOUNTER — Ambulatory Visit: Payer: Medicare HMO | Admitting: Gastroenterology

## 2015-06-25 ENCOUNTER — Encounter: Payer: Self-pay | Admitting: Gastroenterology

## 2015-06-25 NOTE — Telephone Encounter (Signed)
PATIENT WAS A NO SHOW AND LETTER SENT  °

## 2015-09-08 ENCOUNTER — Emergency Department (HOSPITAL_COMMUNITY)
Admission: EM | Admit: 2015-09-08 | Discharge: 2015-09-08 | Disposition: A | Discharge status: Home / Self Care | Payer: Medicare HMO | Attending: Emergency Medicine | Admitting: Emergency Medicine

## 2015-09-08 ENCOUNTER — Emergency Department (HOSPITAL_COMMUNITY): Payer: Medicare HMO

## 2015-09-08 ENCOUNTER — Encounter (HOSPITAL_COMMUNITY): Payer: Self-pay | Admitting: *Deleted

## 2015-09-08 DIAGNOSIS — K219 Gastro-esophageal reflux disease without esophagitis: Secondary | ICD-10-CM | POA: Diagnosis not present

## 2015-09-08 DIAGNOSIS — R079 Chest pain, unspecified: Secondary | ICD-10-CM | POA: Diagnosis not present

## 2015-09-08 DIAGNOSIS — R1110 Vomiting, unspecified: Secondary | ICD-10-CM

## 2015-09-08 DIAGNOSIS — F0390 Unspecified dementia without behavioral disturbance: Secondary | ICD-10-CM | POA: Insufficient documentation

## 2015-09-08 DIAGNOSIS — E860 Dehydration: Secondary | ICD-10-CM | POA: Diagnosis not present

## 2015-09-08 DIAGNOSIS — F17210 Nicotine dependence, cigarettes, uncomplicated: Secondary | ICD-10-CM | POA: Diagnosis not present

## 2015-09-08 DIAGNOSIS — Z79899 Other long term (current) drug therapy: Secondary | ICD-10-CM | POA: Diagnosis not present

## 2015-09-08 DIAGNOSIS — I10 Essential (primary) hypertension: Secondary | ICD-10-CM | POA: Diagnosis not present

## 2015-09-08 DIAGNOSIS — R197 Diarrhea, unspecified: Secondary | ICD-10-CM

## 2015-09-08 DIAGNOSIS — F329 Major depressive disorder, single episode, unspecified: Secondary | ICD-10-CM | POA: Diagnosis not present

## 2015-09-08 DIAGNOSIS — Z7902 Long term (current) use of antithrombotics/antiplatelets: Secondary | ICD-10-CM | POA: Insufficient documentation

## 2015-09-08 DIAGNOSIS — J441 Chronic obstructive pulmonary disease with (acute) exacerbation: Secondary | ICD-10-CM

## 2015-09-08 DIAGNOSIS — G4730 Sleep apnea, unspecified: Secondary | ICD-10-CM | POA: Diagnosis not present

## 2015-09-08 DIAGNOSIS — E876 Hypokalemia: Secondary | ICD-10-CM

## 2015-09-08 DIAGNOSIS — Z9981 Dependence on supplemental oxygen: Secondary | ICD-10-CM | POA: Diagnosis not present

## 2015-09-08 DIAGNOSIS — G8929 Other chronic pain: Secondary | ICD-10-CM | POA: Diagnosis not present

## 2015-09-08 DIAGNOSIS — R0602 Shortness of breath: Secondary | ICD-10-CM | POA: Diagnosis present

## 2015-09-08 LAB — CBC WITH DIFFERENTIAL/PLATELET
BASOS ABS: 0 10*3/uL (ref 0.0–0.1)
BASOS PCT: 0 %
EOS PCT: 3 %
Eosinophils Absolute: 0.3 10*3/uL (ref 0.0–0.7)
HCT: 37.3 % (ref 36.0–46.0)
Hemoglobin: 12 g/dL (ref 12.0–15.0)
Lymphocytes Relative: 22 %
Lymphs Abs: 1.8 10*3/uL (ref 0.7–4.0)
MCH: 29.2 pg (ref 26.0–34.0)
MCHC: 32.2 g/dL (ref 30.0–36.0)
MCV: 90.8 fL (ref 78.0–100.0)
Monocytes Absolute: 0.8 10*3/uL (ref 0.1–1.0)
Monocytes Relative: 10 %
Neutro Abs: 5.1 10*3/uL (ref 1.7–7.7)
Neutrophils Relative %: 65 %
Platelets: 297 10*3/uL (ref 150–400)
RBC: 4.11 MIL/uL (ref 3.87–5.11)
RDW: 14.9 % (ref 11.5–15.5)
WBC: 7.9 10*3/uL (ref 4.0–10.5)

## 2015-09-08 LAB — I-STAT TROPONIN, ED
TROPONIN I, POC: 0 ng/mL (ref 0.00–0.08)
Troponin i, poc: 0 ng/mL (ref 0.00–0.08)

## 2015-09-08 LAB — BASIC METABOLIC PANEL
ANION GAP: 9 (ref 5–15)
BUN: <5 mg/dL — ABNORMAL LOW (ref 6–20)
CALCIUM: 9.5 mg/dL (ref 8.9–10.3)
CO2: 30 mmol/L (ref 22–32)
Chloride: 89 mmol/L — ABNORMAL LOW (ref 101–111)
Creatinine, Ser: 1.02 mg/dL — ABNORMAL HIGH (ref 0.44–1.00)
GFR calc Af Amer: >60 mL/min (ref >60)
GFR, EST NON AFRICAN AMERICAN: 56 mL/min — AB (ref >60)
GLUCOSE: 103 mg/dL — AB (ref 65–99)
Potassium: 3.1 mmol/L — ABNORMAL LOW (ref 3.5–5.1)
SODIUM: 128 mmol/L — AB (ref 135–145)

## 2015-09-08 MED ORDER — IPRATROPIUM-ALBUTEROL 0.5-2.5 (3) MG/3ML IN SOLN
3.0000 mL | Freq: Once | RESPIRATORY_TRACT | Status: AC
  Administered 2015-09-08: 3 mL via RESPIRATORY_TRACT
  Filled 2015-09-08: qty 3

## 2015-09-08 MED ORDER — PREDNISONE 50 MG PO TABS
60.0000 mg | ORAL_TABLET | Freq: Once | ORAL | Status: AC
  Administered 2015-09-08: 60 mg via ORAL
  Filled 2015-09-08: qty 1

## 2015-09-08 MED ORDER — ALBUTEROL SULFATE (2.5 MG/3ML) 0.083% IN NEBU
2.5000 mg | INHALATION_SOLUTION | Freq: Once | RESPIRATORY_TRACT | Status: AC
  Administered 2015-09-08: 2.5 mg via RESPIRATORY_TRACT
  Filled 2015-09-08: qty 3

## 2015-09-08 MED ORDER — SODIUM CHLORIDE 0.9 % IV BOLUS (SEPSIS)
1000.0000 mL | Freq: Once | INTRAVENOUS | Status: AC
Start: 2015-09-08 — End: 2015-09-08
  Administered 2015-09-08: 1000 mL via INTRAVENOUS

## 2015-09-08 MED ORDER — POTASSIUM CHLORIDE CRYS ER 20 MEQ PO TBCR
40.0000 meq | EXTENDED_RELEASE_TABLET | Freq: Once | ORAL | Status: AC
  Administered 2015-09-08: 40 meq via ORAL
  Filled 2015-09-08: qty 2

## 2015-09-08 MED ORDER — PREDNISONE 50 MG PO TABS: ORAL_TABLET | ORAL | Status: DC

## 2015-09-08 NOTE — ED Notes (Signed)
Pt comes in with shortness of breath starting last night along with chest tightness. Shortness of breath starting first. Pt is on 2 L oxygen all of the time. Pt has difficulty breathing upon triage.

## 2015-09-08 NOTE — ED Notes (Signed)
Respiratory paged for breathing tx. 

## 2015-09-08 NOTE — ED Notes (Signed)
MD at bedside. 

## 2015-09-08 NOTE — ED Provider Notes (Signed)
CSN: [id number]     Arrival date & time 09/08/15  1513 History   First MD Initiated Contact with Patient 09/08/15 1526     Chief Complaint  Patient presents with  . Shortness of Breath    Patient is a 64 y.o. female presenting with shortness of breath. The history is provided by the patient and the spouse.  Shortness of Breath Severity:  Moderate Onset quality:  Gradual Duration:  1 day Timing:  Constant Progression:  Worsening Chronicity:  Recurrent Relieved by:  Rest Worsened by:  Activity Associated symptoms: chest pain, cough, sore throat and vomiting   Associated symptoms: no abdominal pain, no fever, no hemoptysis and no syncope   Risk factors: tobacco use   Risk factors comment:  H/o COPD  Patient presents with increasing cough/SOB/wheezing for past day She also reports chest tightness with cough and also chest tightness with exertion She also reports vomiting/diarrhea for past 24 hours and reports multiple episodes No hemoptysis She is on home oxygen at 2L daily and no increase in that concentration of oxygen  She also reports dry mouth and mouth feeling "numb" but no other facial numbness/weakness  Denies h/o CAD/PE She is still smoking cigarettes    Past Medical History  Diagnosis Date  . COPD (chronic obstructive pulmonary disease) ([place])   . Hypertension   . Sleep apnea   . Depression   . On home O2     2L N/C  . Chronic back pain   . Anxiety and depression   . GERD (gastroesophageal reflux disease)   . Chronic neck pain   . MS (multiple sclerosis) ([place])   . Shortness of breath dyspnea   . Asthma   . MS (multiple sclerosis) ([place])     staes xrays showed signs of ms  . Dementia     possible early onset Alzheimer's   Past Surgical History  Procedure Laterality Date  . Abdominal hysterectomy    . Bladder surgery    . Hemorrhoid surgery    . Carpal tunnel release    . Neck surgery    . Shoulder surgery Right   . Esophagogastroduodenoscopy  2009     Dr. Laural Golden: small sliding hiatal hernia with mild reflux esophagitis, empiric dilation with 54/56 F, nonerosive antral gastritis   . Cholecystectomy    . Breast surgery    . Esophagogastroduodenoscopy (egd) with propofol N/A 04/24/2015    RMR: normal esophagus status post Maloney dilation. 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   Venia Minks dilation N/A 04/24/2015    Procedure: MALONEY ESOPHAGEAL DILATION (54FR);  Surgeon: [name], MD;  Location: [facility];  Service: Endoscopy;  Laterality: N/A;  . Esophageal biopsy  04/24/2015    Procedure: BIOPSY (Gastric);  Surgeon: [name], MD;  Location: [facility];  Service: Endoscopy;;   Family History  Problem Relation Age of Onset  . Diabetes Mother   . Diabetes Father   . Diabetes Brother   . Colon cancer Neg Hx    Social History  Substance Use Topics  . Smoking status: Current Every Day Smoker    Types: Cigarettes  . Smokeless tobacco: None     Comment: 3-4 cigarettes a day  . Alcohol Use: No   OB History    Gravida Para Term Preterm AB TAB SAB Ectopic Multiple Living   '1 1 1            '$ Review of Systems  Constitutional: Negative  for fever.  HENT: Positive for sore throat.   Respiratory: Positive for cough and shortness of breath. Negative for hemoptysis.   Cardiovascular: Positive for chest pain. Negative for leg swelling and syncope.  Gastrointestinal: Positive for nausea, vomiting and diarrhea. Negative for abdominal pain.  Neurological: Negative for syncope.  All other systems reviewed and are negative.     Allergies  Review of patient's allergies indicates no known allergies.  Home Medications   Prior to Admission medications   Medication Sig Start Date End Date Taking? Authorizing Provider  albuterol (PROAIR HFA) 108 (90 BASE) MCG/ACT inhaler Inhale 2 puffs into the lungs every 6 (six) hours as needed for shortness of breath.    Yes Historical Provider, MD  albuterol  (PROVENTIL) (2.5 MG/3ML) 0.083% nebulizer solution Take 2.5 mg by nebulization every 6 (six) hours as needed for shortness of breath (Mixed with Ipratropium).    Yes Historical Provider, MD  ALPRAZolam Duanne Moron) 1 MG tablet Take 1 mg by mouth 4 (four) times daily as needed for anxiety.    Yes Historical Provider, MD  Aspirin-Acetaminophen-Caffeine (GOODY HEADACHE PO) Take 1 Package by mouth daily as needed (Pain).    Yes Historical Provider, MD  clopidogrel (PLAVIX) 75 MG tablet Take 75 mg by mouth daily.  01/25/15  Yes Historical Provider, MD  donepezil (ARICEPT) 10 MG tablet Take 10 mg by mouth at bedtime.  11/04/14  Yes Historical Provider, MD  DULERA 100-5 MCG/ACT AERO Take 2 puffs by mouth 2 (two) times daily. 11/04/14  Yes Historical Provider, MD  esomeprazole (NEXIUM) 40 MG capsule Take 40 mg by mouth daily. 11/04/14  Yes Historical Provider, MD  furosemide (LASIX) 20 MG tablet Take 20 mg by mouth every morning.    Yes Historical Provider, MD  ipratropium (ATROVENT) 0.02 % nebulizer solution Take 500 mcg by nebulization every 6 (six) hours as needed for wheezing (*To be mixed with Albuterol for treatments via nebulization*).   Yes Historical Provider, MD  loratadine (CLARITIN) 10 MG tablet Take 10 mg by mouth every morning.    Yes Historical Provider, MD  Oxycodone HCl 20 MG TABS Take 20 mg by mouth 4 (four) times daily as needed (for pain).  11/04/14  Yes Historical Provider, MD  PARoxetine (PAXIL) 40 MG tablet Take 40 mg by mouth every morning.   Yes Historical Provider, MD  potassium chloride (K-DUR) 10 MEQ tablet Take 1 tablet by mouth daily. 02/22/15  Yes Historical Provider, MD  SPIRIVA HANDIHALER 18 MCG inhalation capsule Take 18 mcg by mouth daily.  11/04/14  Yes Historical Provider, MD  benzonatate (TESSALON) 100 MG capsule Take 1 capsule (100 mg total) by mouth 2 (two) times daily. Patient not taking: Reported on 09/08/2015 03/20/15   Radene Gunning, NP  dicyclomine (BENTYL) 10 MG capsule Take  10 mg by mouth 3 (three) times daily before meals.  09/06/15   Historical Provider, MD  guaiFENesin (MUCINEX) 600 MG 12 hr tablet Take 1 tablet (600 mg total) by mouth 2 (two) times daily. Patient not taking: Reported on 09/08/2015 03/20/15   Radene Gunning, NP  levofloxacin (LEVAQUIN) 750 MG tablet Take 1 tablet (750 mg total) by mouth daily. Patient not taking: Reported on 09/08/2015 03/20/15   Radene Gunning, NP  lisinopril (PRINIVIL,ZESTRIL) 10 MG tablet Take 10 mg by mouth daily.    Historical Provider, MD  meloxicam (MOBIC) 7.5 MG tablet Take 7.5 mg by mouth every morning.    Historical Provider, MD  nicotine (NICODERM CQ -  DOSED IN MG/24 HOURS) 21 mg/24hr patch Place 1 patch (21 mg total) onto the skin daily. Patient not taking: Reported on 09/08/2015 03/20/15   Radene Gunning, NP  predniSONE (DELTASONE) 10 MG tablet Take 4 tabs for 4 days starting 03/21/15 take 3 tabs for 4 days then take 2 tabs for 4 days then take 1 tab for 4 days then stop Patient not taking: Reported on 09/08/2015 03/20/15   Radene Gunning, NP  propranolol ER (INDERAL LA) 60 MG 24 hr capsule  09/06/15   Historical Provider, MD  theophylline (THEODUR) 300 MG 12 hr tablet  09/06/15   Historical Provider, MD   BP 125/77 mmHg  Pulse 95  Temp(Src) 98.5 F (36.9 C) (Oral)  Resp 23  Ht '5\' 6"'$  (1.676 m)  Wt 60.782 kg  BMI 21.64 kg/m2  SpO2 95% Physical Exam CONSTITUTIONAL: Well developed/well nourished HEAD: Normocephalic/atraumatic EYES: EOMI ENMT: Mucous membranes moist, uvula midline, no stridor, no drooling NECK: supple no meningeal signs SPINE/BACK:entire spine nontender CV: S1/S2 noted, no murmurs/rubs/gallops noted LUNGS: mild tachypnea noted, wheezing bilaterally ABDOMEN: soft, nontender, no rebound or guarding NEURO: Pt is awake/alert/appropriate, moves all extremitiesx4.  No facial droop.  No arm drift.  No facial numbness noted EXTREMITIES: pulses normal/equal, full ROM, no LE edema noted SKIN: warm, color  normal PSYCH: no abnormalities of mood noted, alert and oriented to situation  ED Course  Procedures   Medications  ipratropium-albuterol (DUONEB) 0.5-2.5 (3) MG/3ML nebulizer solution 3 mL (3 mLs Nebulization Given 09/08/15 1624)  albuterol (PROVENTIL) (2.5 MG/3ML) 0.083% nebulizer solution 2.5 mg (2.5 mg Nebulization Given 09/08/15 1624)  predniSONE (DELTASONE) tablet 60 mg (60 mg Oral Given 09/08/15 1620)  sodium chloride 0.9 % bolus 1,000 mL (0 mLs Intravenous Stopped 09/08/15 1755)  potassium chloride SA (K-DUR,KLOR-CON) CR tablet 40 mEq (40 mEq Oral Given 09/08/15 1655)     4:28 PM Pt stable Probable COPD exacerbation brought on by viral illness (reports vomiting/diarrhea as well) She reports chest tightness worse with exertion and cough. Imaging/labs pending at this time Pt counseled on need to quit smoking 6:54 PM Pt improved Lungs are more clear She told me she felt improved on ambulating but nurses notes state otherwise She still requests to go home I stressed importance of increasing albuterol at home Will give 4 day course of prednisone No other CP reported and she admits mostly chest tightness with cough, I doubt ACS at this time I have low suspicion for acute PE/Dissection at this time BP 122/58 mmHg  Pulse 99  Temp(Src) 98.5 F (36.9 C) (Oral)  Resp 22  Ht '5\' 6"'$  (1.676 m)  Wt 60.782 kg  BMI 21.64 kg/m2  SpO2 97%  Labs Review Labs Reviewed  BASIC METABOLIC PANEL - Abnormal; Notable for the following:    Sodium 128 (*)    Potassium 3.1 (*)    Chloride 89 (*)    Glucose, Bld 103 (*)    BUN <5 (*)    Creatinine, Ser 1.02 (*)    GFR calc non Af Amer 56 (*)    All other components within normal limits  CBC WITH DIFFERENTIAL/PLATELET  Randolm Idol, ED  Randolm Idol, ED    Imaging Review Dg Chest Portable 1 View  09/08/2015  CLINICAL DATA:  Chest pain and congestion.  Short of breath. EXAM: PORTABLE CHEST 1 VIEW COMPARISON:  Radiograph  03/17/2015 FINDINGS: Anterior cervical fusion noted. Normal cardiac silhouette. Lungs are hyperinflated. There is chronic atelectasis at the RIGHT lung  base. No effusion, infiltrate, or pneumothorax. IMPRESSION: Hyperinflated lungs.  No acute findings. Electronically Signed   By: Suzy Bouchard M.D.   On: 09/08/2015 16:55   I have personally reviewed and evaluated these images and lab results as part of my medical decision-making.   EKG Interpretation   Date/Time:  Monday September 08 2015 15:59:20 EST Ventricular Rate:  98 PR Interval:  96 QRS Duration: 97 QT Interval:  383 QTC Calculation: 489 R Axis:   66 Text Interpretation:  Sinus rhythm Short PR interval Borderline prolonged  QT interval No significant change since last tracing Confirmed by Christy Gentles   MD, Elenore Rota ([id number]) on 09/08/2015 4:03:22 PM      MDM   Final diagnoses:  Chronic obstructive pulmonary disease with acute exacerbation (HCC)  Vomiting and diarrhea  Chest pain, unspecified chest pain type  Dehydration  Hypokalemia    Nursing notes including past medical history and social history reviewed and considered in documentation xrays/imaging reviewed by myself and considered during evaluation Labs/vital reviewed myself and considered during evaluation     Ripley Fraise, MD 09/08/15 1857

## 2015-09-08 NOTE — ED Notes (Signed)
Pt was ambulated with 2L O2. Pt became SOB and O2 sats dropped down to 83 % but jumped right back up to 95% when pt sat down.

## 2015-10-14 IMAGING — DX DG CHEST 2V
2 series · 2 of 2 positions shown · non-contrast
Comparison: November 24, 2014

CLINICAL DATA: One day history of chest pain.  Shortness of Breath.

EXAM:
CHEST  2 VIEW

[chest pa]
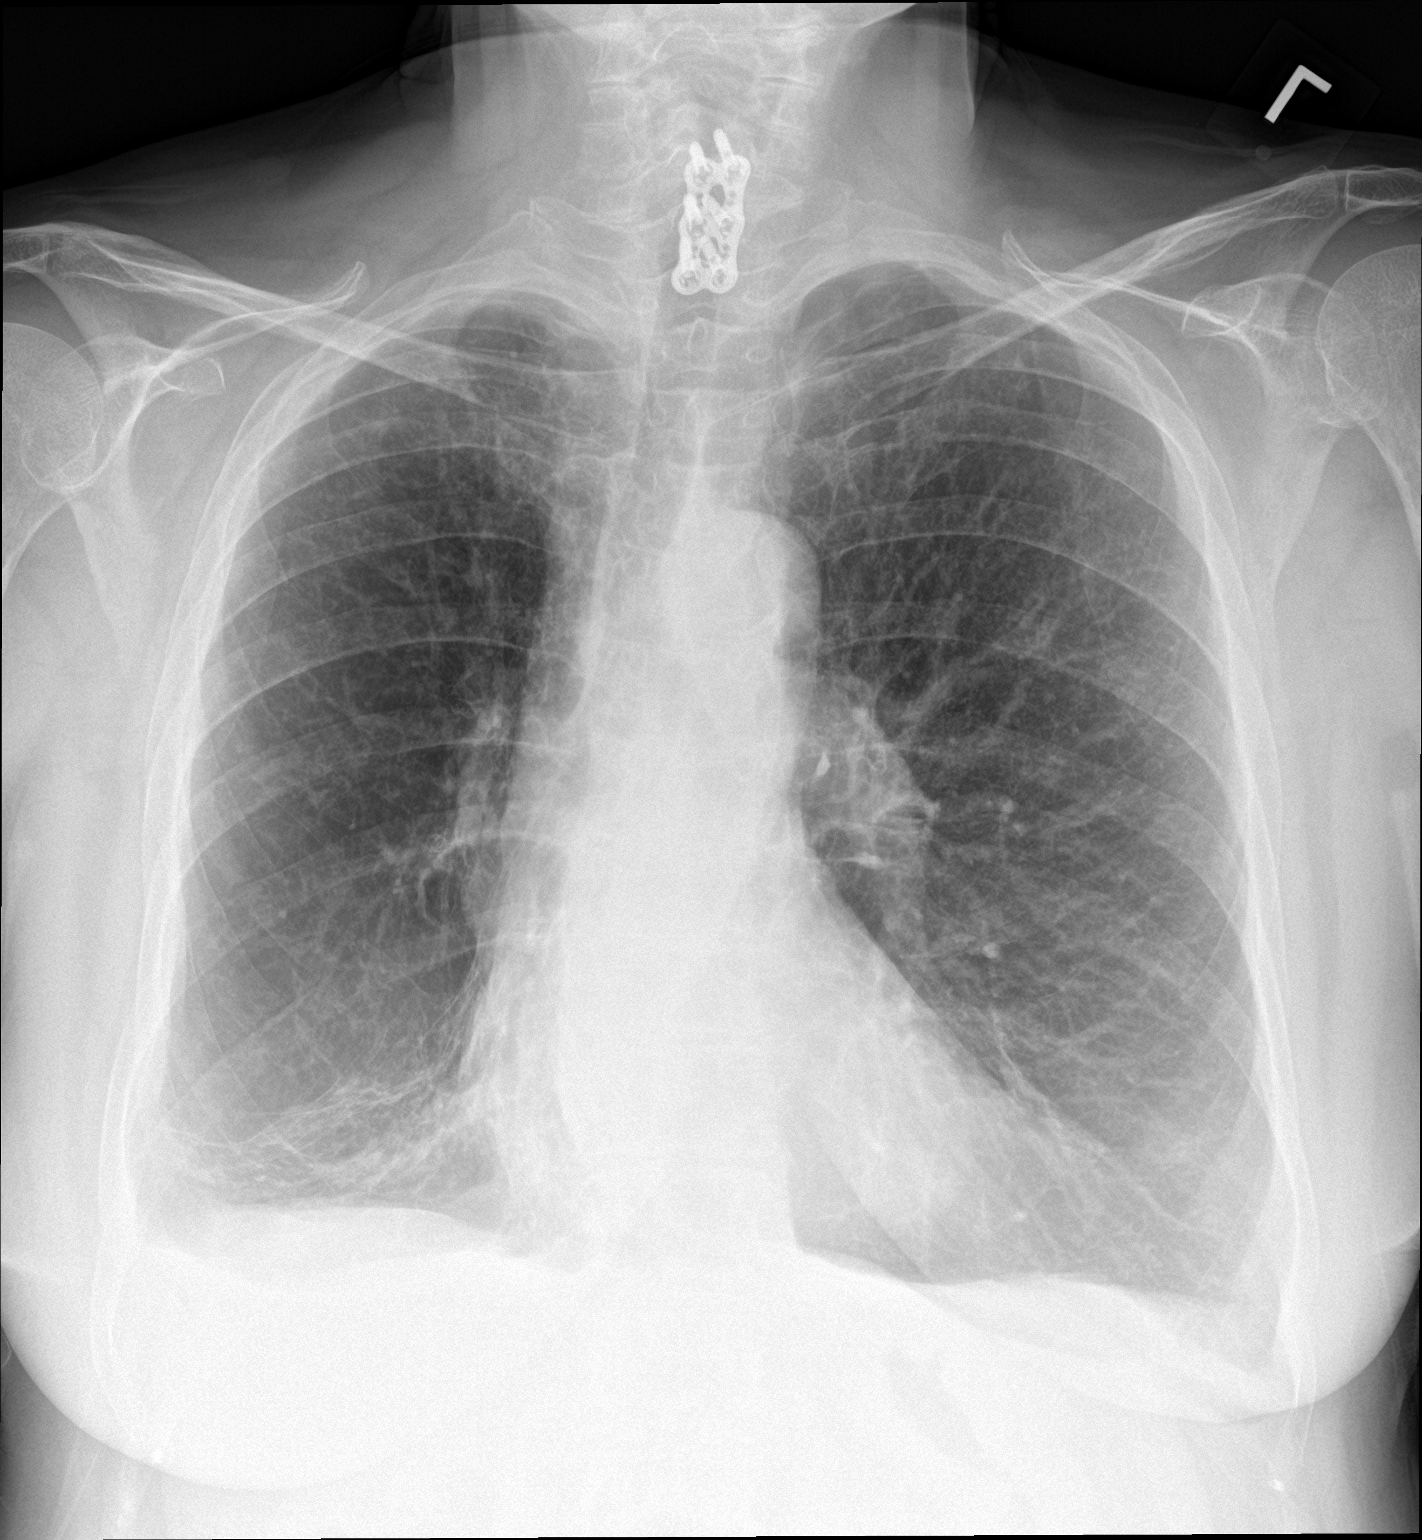

[chest lat]
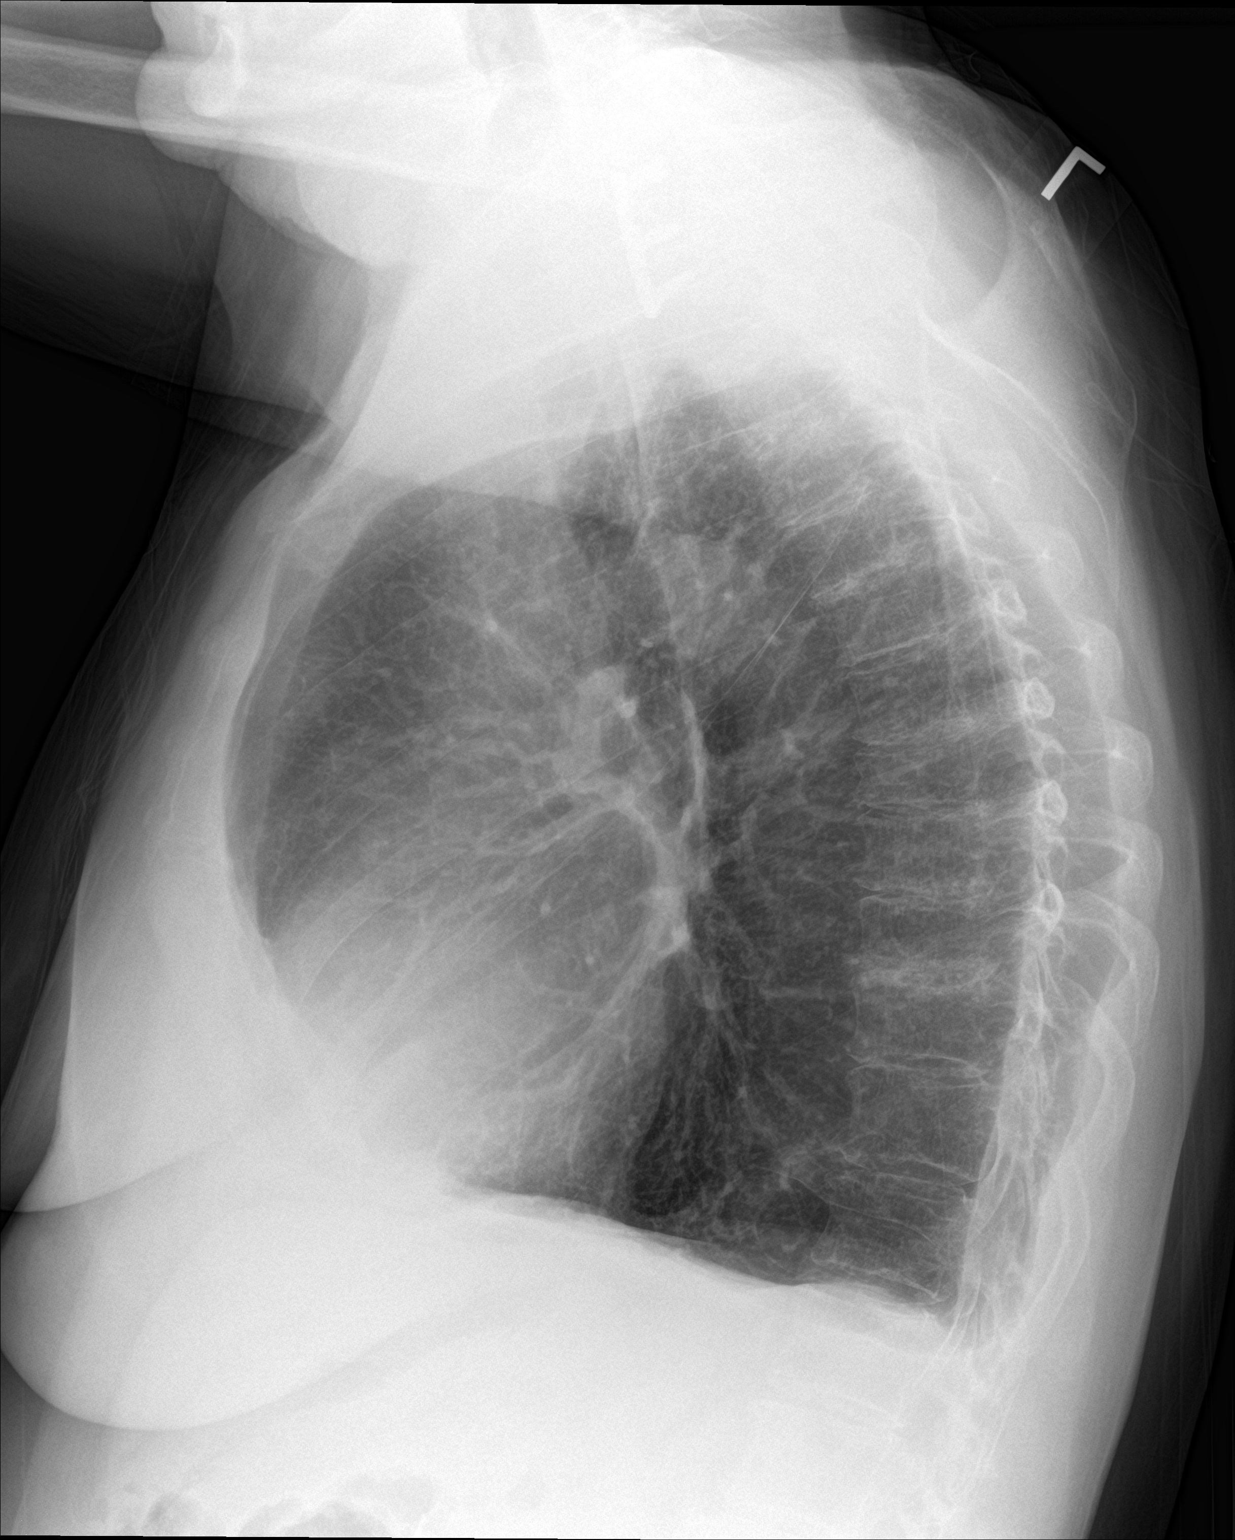

[2 of 2 positions shown; findings below may reference images not displayed]

FINDINGS: There is underlying emphysema. There is stable scarring in the right
base. There is no edema or consolidation. The heart size is normal.
The pulmonary vascularity reflects the underlying emphysema and is
stable. No adenopathy. There is postoperative change in the lower
cervical spine region.
IMPRESSION: Underlying emphysema. Scarring right base. No edema or
consolidation.

## 2015-10-20 ENCOUNTER — Emergency Department (HOSPITAL_COMMUNITY): Payer: Medicare HMO

## 2015-10-20 ENCOUNTER — Encounter (HOSPITAL_COMMUNITY): Payer: Self-pay | Admitting: *Deleted

## 2015-10-20 ENCOUNTER — Emergency Department (HOSPITAL_COMMUNITY)
Admission: EM | Admit: 2015-10-20 | Discharge: 2015-10-20 | Disposition: A | Discharge status: Home / Self Care | Payer: Medicare HMO | Attending: Emergency Medicine | Admitting: Emergency Medicine

## 2015-10-20 DIAGNOSIS — Z7902 Long term (current) use of antithrombotics/antiplatelets: Secondary | ICD-10-CM | POA: Diagnosis not present

## 2015-10-20 DIAGNOSIS — J441 Chronic obstructive pulmonary disease with (acute) exacerbation: Secondary | ICD-10-CM | POA: Insufficient documentation

## 2015-10-20 DIAGNOSIS — I10 Essential (primary) hypertension: Secondary | ICD-10-CM | POA: Insufficient documentation

## 2015-10-20 DIAGNOSIS — R0602 Shortness of breath: Secondary | ICD-10-CM | POA: Diagnosis present

## 2015-10-20 DIAGNOSIS — K219 Gastro-esophageal reflux disease without esophagitis: Secondary | ICD-10-CM | POA: Insufficient documentation

## 2015-10-20 DIAGNOSIS — F419 Anxiety disorder, unspecified: Secondary | ICD-10-CM | POA: Diagnosis not present

## 2015-10-20 DIAGNOSIS — J9801 Acute bronchospasm: Secondary | ICD-10-CM

## 2015-10-20 DIAGNOSIS — Z79899 Other long term (current) drug therapy: Secondary | ICD-10-CM | POA: Insufficient documentation

## 2015-10-20 DIAGNOSIS — F17210 Nicotine dependence, cigarettes, uncomplicated: Secondary | ICD-10-CM | POA: Diagnosis not present

## 2015-10-20 DIAGNOSIS — G8929 Other chronic pain: Secondary | ICD-10-CM | POA: Diagnosis not present

## 2015-10-20 DIAGNOSIS — J40 Bronchitis, not specified as acute or chronic: Secondary | ICD-10-CM

## 2015-10-20 DIAGNOSIS — Z9981 Dependence on supplemental oxygen: Secondary | ICD-10-CM | POA: Diagnosis not present

## 2015-10-20 DIAGNOSIS — G4730 Sleep apnea, unspecified: Secondary | ICD-10-CM | POA: Diagnosis not present

## 2015-10-20 DIAGNOSIS — F329 Major depressive disorder, single episode, unspecified: Secondary | ICD-10-CM | POA: Diagnosis not present

## 2015-10-20 DIAGNOSIS — F0390 Unspecified dementia without behavioral disturbance: Secondary | ICD-10-CM | POA: Insufficient documentation

## 2015-10-20 DIAGNOSIS — R200 Anesthesia of skin: Secondary | ICD-10-CM | POA: Diagnosis not present

## 2015-10-20 LAB — CBC WITH DIFFERENTIAL/PLATELET
BASOS ABS: 0 10*3/uL (ref 0.0–0.1)
BASOS PCT: 1 %
Eosinophils Absolute: 0.2 10*3/uL (ref 0.0–0.7)
Eosinophils Relative: 3 %
HEMATOCRIT: 36.8 % (ref 36.0–46.0)
Hemoglobin: 11.4 g/dL — ABNORMAL LOW (ref 12.0–15.0)
Lymphocytes Relative: 21 %
Lymphs Abs: 1.3 10*3/uL (ref 0.7–4.0)
MCH: 27.5 pg (ref 26.0–34.0)
MCHC: 31 g/dL (ref 30.0–36.0)
MCV: 88.9 fL (ref 78.0–100.0)
MONO ABS: 0.5 10*3/uL (ref 0.1–1.0)
Monocytes Relative: 8 %
NEUTROS ABS: 4.1 10*3/uL (ref 1.7–7.7)
Neutrophils Relative %: 67 %
PLATELETS: 213 10*3/uL (ref 150–400)
RBC: 4.14 MIL/uL (ref 3.87–5.11)
RDW: 14.4 % (ref 11.5–15.5)
WBC: 6.1 10*3/uL (ref 4.0–10.5)

## 2015-10-20 LAB — HEPATIC FUNCTION PANEL
ALBUMIN: 3.1 g/dL — AB (ref 3.5–5.0)
ALT: 11 U/L — ABNORMAL LOW (ref 14–54)
AST: 17 U/L (ref 15–41)
Alkaline Phosphatase: 90 U/L (ref 38–126)
BILIRUBIN TOTAL: 0.3 mg/dL (ref 0.3–1.2)
Bilirubin, Direct: 0.1 mg/dL (ref 0.1–0.5)
Indirect Bilirubin: 0.2 mg/dL — ABNORMAL LOW (ref 0.3–0.9)
Total Protein: 6.2 g/dL — ABNORMAL LOW (ref 6.5–8.1)

## 2015-10-20 LAB — BASIC METABOLIC PANEL
Anion gap: 6 (ref 5–15)
BUN: 5 mg/dL — ABNORMAL LOW (ref 6–20)
CHLORIDE: 92 mmol/L — AB (ref 101–111)
CO2: 34 mmol/L — ABNORMAL HIGH (ref 22–32)
CREATININE: 0.79 mg/dL (ref 0.44–1.00)
Calcium: 9.4 mg/dL (ref 8.9–10.3)
Glucose, Bld: 88 mg/dL (ref 65–99)
POTASSIUM: 4.2 mmol/L (ref 3.5–5.1)
SODIUM: 132 mmol/L — AB (ref 135–145)

## 2015-10-20 LAB — BRAIN NATRIURETIC PEPTIDE: B Natriuretic Peptide: 45 pg/mL (ref 0.0–100.0)

## 2015-10-20 LAB — TROPONIN I

## 2015-10-20 MED ORDER — PREDNISONE 10 MG PO TABS: 20.0000 mg | ORAL_TABLET | Freq: Every day | ORAL | Status: DC

## 2015-10-20 MED ORDER — AZITHROMYCIN 250 MG PO TABS: ORAL_TABLET | ORAL | Status: DC

## 2015-10-20 MED ORDER — ALBUTEROL SULFATE (2.5 MG/3ML) 0.083% IN NEBU
2.5000 mg | INHALATION_SOLUTION | Freq: Once | RESPIRATORY_TRACT | Status: AC
  Administered 2015-10-20: 2.5 mg via RESPIRATORY_TRACT
  Filled 2015-10-20: qty 3

## 2015-10-20 MED ORDER — METHYLPREDNISOLONE SODIUM SUCC 125 MG IJ SOLR
125.0000 mg | Freq: Once | INTRAMUSCULAR | Status: AC
  Administered 2015-10-20: 125 mg via INTRAVENOUS
  Filled 2015-10-20: qty 2

## 2015-10-20 MED ORDER — IPRATROPIUM-ALBUTEROL 0.5-2.5 (3) MG/3ML IN SOLN
3.0000 mL | Freq: Once | RESPIRATORY_TRACT | Status: AC
  Administered 2015-10-20: 3 mL via RESPIRATORY_TRACT
  Filled 2015-10-20: qty 3

## 2015-10-20 NOTE — ED Notes (Signed)
Pt c/o chest pain that is intermittent, started yesterday. Pt has hx of COPD, also c/o shortness of breath. Pt. Talking in complete sentences, on 2L.

## 2015-10-20 NOTE — ED Provider Notes (Signed)
CSN: [id number]     Arrival date & time 10/20/15  1213 History  By signing my name below, I, Hilda Lias, attest that this documentation has been prepared under the direction and in the presence of Milton Ferguson, MD. Electronically Signed: Hilda Lias, ED Scribe. 10/20/2015. 1:01 PM.     Chief Complaint  Patient presents with  . Chest Pain      Patient is a 65 y.o. female presenting with chest pain. The history is provided by the patient. No language interpreter was used.  Chest Pain Pain location:  Unable to specify Pain radiates to:  Does not radiate Pain radiates to the back: no   Pain severity:  Moderate Onset quality:  Sudden Duration:  1 day Timing:  Intermittent Chronicity:  New Context: at rest   Relieved by:  None tried Worsened by:  Nothing tried Ineffective treatments:  None tried Associated symptoms: cough, numbness, shortness of breath and weakness   Associated symptoms: no abdominal pain, no back pain, no fatigue and no headache    HPI Comments: Olivia Randall is a 65 y.o. female with a hx of COPD, HTN, sleep apnea who presents to the Emergency Department complaining of intermittent chest pain that began yesterday. Pt also reports having constant SOB, and states she has been coughing up white and yellow sputum intermittently. Pt states when her intermittent chest pain begins she starts to get numb in her lips. Pt reports she is a smoker, but denies smoking since her symptoms began.   Past Medical History  Diagnosis Date  . COPD (chronic obstructive pulmonary disease) ([place])   . Hypertension   . Sleep apnea   . Depression   . On home O2     2L N/C  . Chronic back pain   . Anxiety and depression   . GERD (gastroesophageal reflux disease)   . Chronic neck pain   . MS (multiple sclerosis) ([place])   . Shortness of breath dyspnea   . Asthma   . MS (multiple sclerosis) ([place])     staes xrays showed signs of ms  . Dementia     possible early onset Alzheimer's    Past Surgical History  Procedure Laterality Date  . Abdominal hysterectomy    . Bladder surgery    . Hemorrhoid surgery    . Carpal tunnel release    . Neck surgery    . Shoulder surgery Right   . Esophagogastroduodenoscopy  2009    Dr. Laural Golden: small sliding hiatal hernia with mild reflux esophagitis, empiric dilation with 54/56 F, nonerosive antral gastritis   . Cholecystectomy    . Breast surgery    . Esophagogastroduodenoscopy (egd) with propofol N/A 04/24/2015    RMR: normal esophagus status post Maloney dilation. 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   Venia Minks dilation N/A 04/24/2015    Procedure: MALONEY ESOPHAGEAL DILATION (54FR);  Surgeon: [name], MD;  Location: [facility];  Service: Endoscopy;  Laterality: N/A;  . Biopsy  04/24/2015    Procedure: BIOPSY (Gastric);  Surgeon: [name], MD;  Location: [facility];  Service: Endoscopy;;   Family History  Problem Relation Age of Onset  . Diabetes Mother   . Diabetes Father   . Diabetes Brother   . Colon cancer Neg Hx    Social History  Substance Use Topics  . Smoking status: Current Every Day Smoker    Types: Cigarettes  . Smokeless tobacco: None  Comment: 3-4 cigarettes a day  . Alcohol Use: No   OB History    Gravida Para Term Preterm AB TAB SAB Ectopic Multiple Living   '1 1 1            '$ Review of Systems  Constitutional: Negative for appetite change and fatigue.  HENT: Negative for congestion, ear discharge and sinus pressure.   Eyes: Negative for discharge.  Respiratory: Positive for cough and shortness of breath.   Cardiovascular: Positive for chest pain.  Gastrointestinal: Negative for abdominal pain and diarrhea.  Genitourinary: Negative for frequency and hematuria.  Musculoskeletal: Negative for back pain.  Skin: Negative for rash.  Neurological: Positive for weakness and numbness. Negative for seizures and headaches.  Psychiatric/Behavioral: Negative for  hallucinations.      Allergies  Review of patient's allergies indicates no known allergies.  Home Medications   Prior to Admission medications   Medication Sig Start Date End Date Taking? Authorizing Provider  albuterol (PROAIR HFA) 108 (90 BASE) MCG/ACT inhaler Inhale 2 puffs into the lungs every 6 (six) hours as needed for shortness of breath.     Historical Provider, MD  albuterol (PROVENTIL) (2.5 MG/3ML) 0.083% nebulizer solution Take 2.5 mg by nebulization every 6 (six) hours as needed for shortness of breath (Mixed with Ipratropium).     Historical Provider, MD  ALPRAZolam Duanne Moron) 1 MG tablet Take 1 mg by mouth 4 (four) times daily as needed for anxiety.     Historical Provider, MD  Aspirin-Acetaminophen-Caffeine (GOODY HEADACHE PO) Take 1 Package by mouth daily as needed (Pain).     Historical Provider, MD  clopidogrel (PLAVIX) 75 MG tablet Take 75 mg by mouth daily.  01/25/15   Historical Provider, MD  dicyclomine (BENTYL) 10 MG capsule Take 10 mg by mouth 3 (three) times daily before meals.  09/06/15   Historical Provider, MD  donepezil (ARICEPT) 10 MG tablet Take 10 mg by mouth at bedtime.  11/04/14   Historical Provider, MD  DULERA 100-5 MCG/ACT AERO Take 2 puffs by mouth 2 (two) times daily. 11/04/14   Historical Provider, MD  esomeprazole (NEXIUM) 40 MG capsule Take 40 mg by mouth daily. 11/04/14   Historical Provider, MD  furosemide (LASIX) 20 MG tablet Take 20 mg by mouth every morning.     Historical Provider, MD  ipratropium (ATROVENT) 0.02 % nebulizer solution Take 500 mcg by nebulization every 6 (six) hours as needed for wheezing (*To be mixed with Albuterol for treatments via nebulization*).    Historical Provider, MD  lisinopril (PRINIVIL,ZESTRIL) 10 MG tablet Take 10 mg by mouth daily.    Historical Provider, MD  loratadine (CLARITIN) 10 MG tablet Take 10 mg by mouth every morning.     Historical Provider, MD  meloxicam (MOBIC) 7.5 MG tablet Take 7.5 mg by mouth every  morning.    Historical Provider, MD  Oxycodone HCl 20 MG TABS Take 20 mg by mouth 4 (four) times daily as needed (for pain).  11/04/14   Historical Provider, MD  PARoxetine (PAXIL) 40 MG tablet Take 40 mg by mouth every morning.    Historical Provider, MD  potassium chloride (K-DUR) 10 MEQ tablet Take 1 tablet by mouth daily. 02/22/15   Historical Provider, MD  predniSONE (DELTASONE) 50 MG tablet One tablet PO daily for 4 days 09/08/15   Ripley Fraise, MD  propranolol ER (INDERAL LA) 60 MG 24 hr capsule  09/06/15   Historical Provider, MD  SPIRIVA HANDIHALER 18 MCG inhalation capsule Take 18  mcg by mouth daily.  11/04/14   Historical Provider, MD  theophylline (THEODUR) 300 MG 12 hr tablet Take 300 mg by mouth daily.  09/06/15   Historical Provider, MD   BP 128/112 mmHg  Pulse 81  Temp(Src) 97.7 F (36.5 C) (Tympanic)  Resp 18  Ht '5\' 6"'$  (1.676 m)  Wt 133 lb (60.328 kg)  BMI 21.48 kg/m2  SpO2 97% Physical Exam  Constitutional: She is oriented to person, place, and time. She appears well-developed.  HENT:  Head: Normocephalic.  Eyes: Conjunctivae and EOM are normal. No scleral icterus.  Neck: Neck supple. No thyromegaly present.  Cardiovascular: Normal rate and regular rhythm.  Exam reveals no gallop and no friction rub.   No murmur heard. Pulmonary/Chest: No stridor. She has wheezes. She has no rales. She exhibits no tenderness.  Moderate wheezing throughout  Abdominal: She exhibits no distension. There is no tenderness. There is no rebound.  Musculoskeletal: Normal range of motion. She exhibits no edema.  Lymphadenopathy:    She has no cervical adenopathy.  Neurological: She is oriented to person, place, and time. She exhibits normal muscle tone. Coordination normal.  Skin: No rash noted. No erythema.  Psychiatric: She has a normal mood and affect. Her behavior is normal.    ED Course  Procedures (including critical care time)  DIAGNOSTIC STUDIES: Oxygen Saturation is 97% on  2L [place], normal by my interpretation.    COORDINATION OF CARE: 12:53 PM Discussed treatment plan with pt at bedside and pt agreed to plan.   Labs Review Labs Reviewed  BASIC METABOLIC PANEL  CBC  TROPONIN I    Imaging Review Dg Chest 2 View  10/20/2015  CLINICAL DATA:  Three-day history of chest pain, left-sided, and productive cough EXAM: CHEST  2 VIEW COMPARISON:  September 08, 2015 FINDINGS: There is scarring in the lung bases with chronic blunting of the right costophrenic angle. Lungs are somewhat hyperexpanded. There is no edema or consolidation. There is right lower lobe scarring, stable. Heart size and pulmonary vascularity are normal. No adenopathy. No pneumothorax. There is postoperative change in the lower cervical region. IMPRESSION: Lungs hyperexpanded. Scarring in right lower lobe with chronic blunting of the right costophrenic angle. No edema or consolidation. No change in cardiac silhouette. Electronically Signed   By: Lowella Grip III M.D.   On: 10/20/2015 12:42   I have personally reviewed and evaluated these images and lab results as part of my medical decision-making.   EKG Interpretation   Date/Time:  Monday October 20 2015 12:25:23 EST Ventricular Rate:  94 PR Interval:  150 QRS Duration: 84 QT Interval:  382 QTC Calculation: 477 R Axis:   80 Text Interpretation:  Normal sinus rhythm Normal ECG Confirmed by Tamella Tuccillo   MD, Maysen Sudol [phone]) on 10/20/2015 4:12:56 PM    me:  Monday October 20 2015 12:25:23 EST Ventricular Rate:  94 PR Interval:  150 QRS Duration: 84 QT Interval:  382 QTC Calculation: 477 R Axis:   80 Text Interpretation:  Normal sinus rhythm Normal ECG Confirmed by Virginia Curl   MD, Alem Fahl ([id number]) on 10/20/2015 4:12:56 PM    me:  Monday October 20 2015 12:25:23 EST Ventricular Rate:  94 PR Interval:  150 QRS Duration: 84 QT Interval:  382 QTC Calculation: 477 R Axis:   80 Text Interpretation:  Normal sinus rhythm Normal ECG Confirmed by  Maiya Kates   MD, Clella Mckeel [phone]) on 10/20/2015 4:12:56 PM    me:  Monday October 20 2015 12:25:23 EST  Ventricular Rate:  94 PR Interval:  150 QRS Duration: 84 QT Interval:  382 QTC Calculation: 477 R Axis:   80 Text Interpretation:  Normal sinus rhythm Normal ECG Confirmed by Latia Mataya   MD, Broadus John ([id number]) on 10/20/2015 4:12:56 PM      MDM   Final diagnoses:  None   patient with exacerbation of COPD post likely from bronchitis. She is put on Z-Pak and prednisone will continue her inhalers and follow-up with her PCP this week.j  The chart was scribed for me under my direct supervision.  I personally performed the history, physical, and medical decision making and all procedures in the evaluation of this patient.Milton Ferguson, MD 10/20/15 [phone]

## 2015-10-20 NOTE — Discharge Instructions (Signed)
Follow up with your md later this week for recheck °

## 2015-11-08 ENCOUNTER — Encounter (HOSPITAL_COMMUNITY): Payer: Self-pay

## 2015-11-08 ENCOUNTER — Emergency Department (HOSPITAL_COMMUNITY): Payer: Medicare HMO

## 2015-11-08 ENCOUNTER — Observation Stay (HOSPITAL_COMMUNITY)
Admission: EM | Admit: 2015-11-08 | Discharge: 2015-11-10 | Disposition: A | Discharge status: Home / Self Care | Payer: Medicare HMO | Attending: Internal Medicine | Admitting: Internal Medicine

## 2015-11-08 DIAGNOSIS — K219 Gastro-esophageal reflux disease without esophagitis: Secondary | ICD-10-CM | POA: Insufficient documentation

## 2015-11-08 DIAGNOSIS — J441 Chronic obstructive pulmonary disease with (acute) exacerbation: Principal | ICD-10-CM | POA: Insufficient documentation

## 2015-11-08 DIAGNOSIS — F329 Major depressive disorder, single episode, unspecified: Secondary | ICD-10-CM | POA: Insufficient documentation

## 2015-11-08 DIAGNOSIS — G8929 Other chronic pain: Secondary | ICD-10-CM | POA: Insufficient documentation

## 2015-11-08 DIAGNOSIS — J45909 Unspecified asthma, uncomplicated: Secondary | ICD-10-CM | POA: Diagnosis not present

## 2015-11-08 DIAGNOSIS — J9621 Acute and chronic respiratory failure with hypoxia: Secondary | ICD-10-CM | POA: Diagnosis not present

## 2015-11-08 DIAGNOSIS — G4730 Sleep apnea, unspecified: Secondary | ICD-10-CM | POA: Insufficient documentation

## 2015-11-08 DIAGNOSIS — G35 Multiple sclerosis: Secondary | ICD-10-CM | POA: Insufficient documentation

## 2015-11-08 DIAGNOSIS — M549 Dorsalgia, unspecified: Secondary | ICD-10-CM | POA: Insufficient documentation

## 2015-11-08 DIAGNOSIS — F419 Anxiety disorder, unspecified: Secondary | ICD-10-CM | POA: Diagnosis not present

## 2015-11-08 DIAGNOSIS — Z9981 Dependence on supplemental oxygen: Secondary | ICD-10-CM | POA: Insufficient documentation

## 2015-11-08 DIAGNOSIS — I10 Essential (primary) hypertension: Secondary | ICD-10-CM

## 2015-11-08 DIAGNOSIS — F0390 Unspecified dementia without behavioral disturbance: Secondary | ICD-10-CM | POA: Insufficient documentation

## 2015-11-08 DIAGNOSIS — F17200 Nicotine dependence, unspecified, uncomplicated: Secondary | ICD-10-CM | POA: Diagnosis present

## 2015-11-08 LAB — BASIC METABOLIC PANEL
ANION GAP: 4 — AB (ref 5–15)
BUN: 8 mg/dL (ref 6–20)
CALCIUM: 9.6 mg/dL (ref 8.9–10.3)
CHLORIDE: 92 mmol/L — AB (ref 101–111)
CO2: 39 mmol/L — AB (ref 22–32)
Creatinine, Ser: 0.61 mg/dL (ref 0.44–1.00)
GFR calc non Af Amer: >60 mL/min (ref >60)
GLUCOSE: 131 mg/dL — AB (ref 65–99)
POTASSIUM: 4.5 mmol/L (ref 3.5–5.1)
Sodium: 135 mmol/L (ref 135–145)

## 2015-11-08 LAB — CBC
HEMATOCRIT: 37.8 % (ref 36.0–46.0)
HEMOGLOBIN: 11.3 g/dL — AB (ref 12.0–15.0)
MCH: 27.2 pg (ref 26.0–34.0)
MCHC: 29.9 g/dL — AB (ref 30.0–36.0)
MCV: 90.9 fL (ref 78.0–100.0)
Platelets: 260 10*3/uL (ref 150–400)
RBC: 4.16 MIL/uL (ref 3.87–5.11)
RDW: 14.6 % (ref 11.5–15.5)
WBC: 8.7 10*3/uL (ref 4.0–10.5)

## 2015-11-08 LAB — INFLUENZA PANEL BY PCR (TYPE A & B)
H1N1FLUPCR: NOT DETECTED
Influenza A By PCR: NEGATIVE
Influenza B By PCR: NEGATIVE

## 2015-11-08 LAB — TROPONIN I

## 2015-11-08 LAB — BRAIN NATRIURETIC PEPTIDE: B Natriuretic Peptide: 94 pg/mL (ref 0.0–100.0)

## 2015-11-08 MED ORDER — PANTOPRAZOLE SODIUM 40 MG PO TBEC
40.0000 mg | DELAYED_RELEASE_TABLET | Freq: Every day | ORAL | Status: DC
  Administered 2015-11-08 – 2015-11-10 (×3): 40 mg via ORAL
  Filled 2015-11-08 (×3): qty 1

## 2015-11-08 MED ORDER — ACETAMINOPHEN 650 MG RE SUPP: 650.0000 mg | Freq: Four times a day (QID) | RECTAL | Status: DC | PRN

## 2015-11-08 MED ORDER — THEOPHYLLINE ER 300 MG PO TB12
300.0000 mg | ORAL_TABLET | Freq: Every day | ORAL | Status: DC
  Administered 2015-11-08 – 2015-11-10 (×3): 300 mg via ORAL
  Filled 2015-11-08 (×3): qty 1

## 2015-11-08 MED ORDER — METHYLPREDNISOLONE SODIUM SUCC 125 MG IJ SOLR
125.0000 mg | Freq: Once | INTRAMUSCULAR | Status: AC
  Administered 2015-11-08: 125 mg via INTRAVENOUS
  Filled 2015-11-08: qty 2

## 2015-11-08 MED ORDER — PROPRANOLOL HCL ER 60 MG PO CP24
60.0000 mg | ORAL_CAPSULE | Freq: Every day | ORAL | Status: DC
  Administered 2015-11-09 – 2015-11-10 (×2): 60 mg via ORAL
  Filled 2015-11-08 (×5): qty 1

## 2015-11-08 MED ORDER — OXYCODONE HCL 20 MG PO TABS: 20.0000 mg | ORAL_TABLET | Freq: Four times a day (QID) | ORAL | Status: DC | PRN

## 2015-11-08 MED ORDER — IPRATROPIUM-ALBUTEROL 0.5-2.5 (3) MG/3ML IN SOLN
3.0000 mL | Freq: Once | RESPIRATORY_TRACT | Status: AC
  Administered 2015-11-08: 3 mL via RESPIRATORY_TRACT
  Filled 2015-11-08: qty 3

## 2015-11-08 MED ORDER — FUROSEMIDE 20 MG PO TABS
20.0000 mg | ORAL_TABLET | Freq: Every morning | ORAL | Status: DC
  Administered 2015-11-09 – 2015-11-10 (×2): 20 mg via ORAL
  Filled 2015-11-08 (×3): qty 1

## 2015-11-08 MED ORDER — ONDANSETRON HCL 4 MG PO TABS: 4.0000 mg | ORAL_TABLET | Freq: Four times a day (QID) | ORAL | Status: DC | PRN

## 2015-11-08 MED ORDER — SODIUM CHLORIDE 0.9% FLUSH
3.0000 mL | Freq: Two times a day (BID) | INTRAVENOUS | Status: DC
  Administered 2015-11-08 – 2015-11-10 (×4): 3 mL via INTRAVENOUS

## 2015-11-08 MED ORDER — SENNOSIDES-DOCUSATE SODIUM 8.6-50 MG PO TABS: 1.0000 | ORAL_TABLET | Freq: Every evening | ORAL | Status: DC | PRN

## 2015-11-08 MED ORDER — ONDANSETRON HCL 4 MG/2ML IJ SOLN: 4.0000 mg | Freq: Four times a day (QID) | INTRAMUSCULAR | Status: DC | PRN

## 2015-11-08 MED ORDER — SODIUM CHLORIDE 0.9 % IV SOLN: 250.0000 mL | INTRAVENOUS | Status: DC | PRN

## 2015-11-08 MED ORDER — LORATADINE 10 MG PO TABS
10.0000 mg | ORAL_TABLET | Freq: Every morning | ORAL | Status: DC
  Administered 2015-11-08 – 2015-11-10 (×3): 10 mg via ORAL
  Filled 2015-11-08 (×3): qty 1

## 2015-11-08 MED ORDER — DONEPEZIL HCL 5 MG PO TABS
10.0000 mg | ORAL_TABLET | Freq: Every day | ORAL | Status: DC
  Administered 2015-11-08 – 2015-11-09 (×2): 10 mg via ORAL
  Filled 2015-11-08 (×2): qty 2

## 2015-11-08 MED ORDER — OXYCODONE HCL 5 MG PO TABS
20.0000 mg | ORAL_TABLET | Freq: Four times a day (QID) | ORAL | Status: DC | PRN
  Administered 2015-11-08 – 2015-11-09 (×3): 20 mg via ORAL
  Filled 2015-11-08 (×4): qty 4

## 2015-11-08 MED ORDER — TIOTROPIUM BROMIDE MONOHYDRATE 18 MCG IN CAPS
18.0000 ug | ORAL_CAPSULE | Freq: Every day | RESPIRATORY_TRACT | Status: DC
  Administered 2015-11-09 – 2015-11-10 (×2): 18 ug via RESPIRATORY_TRACT
  Filled 2015-11-08: qty 5

## 2015-11-08 MED ORDER — LISINOPRIL 10 MG PO TABS
10.0000 mg | ORAL_TABLET | Freq: Every day | ORAL | Status: DC
  Administered 2015-11-08 – 2015-11-10 (×3): 10 mg via ORAL
  Filled 2015-11-08 (×3): qty 1

## 2015-11-08 MED ORDER — ALBUTEROL SULFATE (2.5 MG/3ML) 0.083% IN NEBU: 2.5000 mg | INHALATION_SOLUTION | RESPIRATORY_TRACT | Status: DC | PRN

## 2015-11-08 MED ORDER — MOMETASONE FURO-FORMOTEROL FUM 100-5 MCG/ACT IN AERO
INHALATION_SPRAY | RESPIRATORY_TRACT | Status: AC
  Filled 2015-11-08: qty 8.8

## 2015-11-08 MED ORDER — LEVOFLOXACIN 750 MG PO TABS
750.0000 mg | ORAL_TABLET | Freq: Every day | ORAL | Status: DC
  Administered 2015-11-08 – 2015-11-10 (×3): 750 mg via ORAL
  Filled 2015-11-08 (×3): qty 1

## 2015-11-08 MED ORDER — DICYCLOMINE HCL 10 MG PO CAPS
10.0000 mg | ORAL_CAPSULE | Freq: Three times a day (TID) | ORAL | Status: DC
Start: 2015-11-09 — End: 2015-11-10
  Administered 2015-11-08 – 2015-11-10 (×6): 10 mg via ORAL
  Filled 2015-11-08 (×6): qty 1

## 2015-11-08 MED ORDER — ENOXAPARIN SODIUM 40 MG/0.4ML ~~LOC~~ SOLN
40.0000 mg | SUBCUTANEOUS | Status: DC
  Administered 2015-11-08 – 2015-11-09 (×2): 40 mg via SUBCUTANEOUS
  Filled 2015-11-08 (×2): qty 0.4

## 2015-11-08 MED ORDER — PAROXETINE HCL 20 MG PO TABS
40.0000 mg | ORAL_TABLET | ORAL | Status: DC
  Administered 2015-11-08 – 2015-11-10 (×3): 40 mg via ORAL
  Filled 2015-11-08 (×3): qty 2

## 2015-11-08 MED ORDER — MOMETASONE FURO-FORMOTEROL FUM 100-5 MCG/ACT IN AERO
2.0000 | INHALATION_SPRAY | Freq: Two times a day (BID) | RESPIRATORY_TRACT | Status: DC
  Administered 2015-11-08 – 2015-11-10 (×4): 2 via RESPIRATORY_TRACT
  Filled 2015-11-08: qty 8.8

## 2015-11-08 MED ORDER — IPRATROPIUM-ALBUTEROL 0.5-2.5 (3) MG/3ML IN SOLN
3.0000 mL | RESPIRATORY_TRACT | Status: DC
  Administered 2015-11-08: 3 mL via RESPIRATORY_TRACT
  Filled 2015-11-08: qty 3

## 2015-11-08 MED ORDER — ACETAMINOPHEN 325 MG PO TABS
650.0000 mg | ORAL_TABLET | Freq: Four times a day (QID) | ORAL | Status: DC | PRN
  Administered 2015-11-08: 650 mg via ORAL
  Filled 2015-11-08: qty 2

## 2015-11-08 MED ORDER — OSELTAMIVIR PHOSPHATE 75 MG PO CAPS
75.0000 mg | ORAL_CAPSULE | Freq: Two times a day (BID) | ORAL | Status: DC
  Administered 2015-11-08 – 2015-11-09 (×2): 75 mg via ORAL
  Filled 2015-11-08 (×2): qty 1

## 2015-11-08 MED ORDER — CETYLPYRIDINIUM CHLORIDE 0.05 % MT LIQD
7.0000 mL | Freq: Two times a day (BID) | OROMUCOSAL | Status: DC
  Administered 2015-11-08: 7 mL via OROMUCOSAL

## 2015-11-08 MED ORDER — CLOPIDOGREL BISULFATE 75 MG PO TABS
75.0000 mg | ORAL_TABLET | Freq: Every day | ORAL | Status: DC
  Administered 2015-11-08 – 2015-11-10 (×3): 75 mg via ORAL
  Filled 2015-11-08 (×3): qty 1

## 2015-11-08 MED ORDER — ALPRAZOLAM 1 MG PO TABS
1.0000 mg | ORAL_TABLET | Freq: Four times a day (QID) | ORAL | Status: DC | PRN
  Administered 2015-11-08: 1 mg via ORAL
  Filled 2015-11-08 (×2): qty 1

## 2015-11-08 MED ORDER — GUAIFENESIN ER 600 MG PO TB12
1200.0000 mg | ORAL_TABLET | Freq: Two times a day (BID) | ORAL | Status: DC
  Administered 2015-11-08 – 2015-11-10 (×4): 1200 mg via ORAL
  Filled 2015-11-08 (×4): qty 2

## 2015-11-08 MED ORDER — METHYLPREDNISOLONE SODIUM SUCC 125 MG IJ SOLR
60.0000 mg | Freq: Four times a day (QID) | INTRAMUSCULAR | Status: DC
  Administered 2015-11-08 – 2015-11-09 (×4): 60 mg via INTRAVENOUS
  Filled 2015-11-08 (×4): qty 2

## 2015-11-08 MED ORDER — LEVOFLOXACIN IN D5W 500 MG/100ML IV SOLN
500.0000 mg | Freq: Once | INTRAVENOUS | Status: AC
  Administered 2015-11-08: 500 mg via INTRAVENOUS
  Filled 2015-11-08: qty 100

## 2015-11-08 MED ORDER — CHLORHEXIDINE GLUCONATE 0.12 % MT SOLN: 15.0000 mL | Freq: Two times a day (BID) | OROMUCOSAL | Status: DC

## 2015-11-08 MED ORDER — ALBUTEROL SULFATE (2.5 MG/3ML) 0.083% IN NEBU
2.5000 mg | INHALATION_SOLUTION | Freq: Once | RESPIRATORY_TRACT | Status: AC
  Administered 2015-11-08: 2.5 mg via RESPIRATORY_TRACT
  Filled 2015-11-08: qty 3

## 2015-11-08 MED ORDER — POTASSIUM CHLORIDE CRYS ER 10 MEQ PO TBCR
10.0000 meq | EXTENDED_RELEASE_TABLET | Freq: Every day | ORAL | Status: DC
  Administered 2015-11-08 – 2015-11-10 (×3): 10 meq via ORAL
  Filled 2015-11-08 (×6): qty 1

## 2015-11-08 MED ORDER — SODIUM CHLORIDE 0.9% FLUSH: 3.0000 mL | INTRAVENOUS | Status: DC | PRN

## 2015-11-08 MED ORDER — SODIUM CHLORIDE 0.9 % IV SOLN: INTRAVENOUS | Status: DC

## 2015-11-08 NOTE — H&P (Signed)
[facility]          History and Physical    PCP:   Terald Sleeper, PA-C   EDP: Ezequiel Essex, M.D.  Chief Complaint:  Shortness of breath  HPI: Patient is a 65 year old woman with history of COPD on chronic oxygen 2 L and multiple sclerosis who presents to the hospital today with a three-day history of increasing shortness of breath. She states she has had chills but no fever, severe muscle aches, headache, she has had multiple sick contacts. She was admitted given nebulizer treatments and steroids, however she continued to have wheezing, was more hypoxemic than usual with sats dipping into the 70s on her usual regimen of 2 L. Admission was requested for further evaluation and management. In the emergency department labs were essentially within normal limits, chest x-ray with no acute findings but definite evidence for COPD.  Allergies:  No Known Allergies    Past Medical History  Diagnosis Date  . COPD (chronic obstructive pulmonary disease) ([place])   . Hypertension   . Sleep apnea   . Depression   . On home O2     2L N/C  . Chronic back pain   . Anxiety and depression   . GERD (gastroesophageal reflux disease)   . Chronic neck pain   . MS (multiple sclerosis) ([place])   . Shortness of breath dyspnea   . Asthma   . MS (multiple sclerosis) ([place])     staes xrays showed signs of ms  . Dementia     possible early onset Alzheimer's    Past Surgical History  Procedure Laterality Date  . Abdominal hysterectomy    . Bladder surgery    . Hemorrhoid surgery    . Carpal tunnel release    . Neck surgery    . Shoulder surgery Right   . Esophagogastroduodenoscopy  2009    Dr. Laural Golden: small sliding hiatal hernia with mild reflux esophagitis, empiric dilation with 54/56 F, nonerosive antral gastritis   . Cholecystectomy    . Breast surgery    . Esophagogastroduodenoscopy (egd) with propofol N/A 04/24/2015    RMR: normal esophagus status post Maloney  dilation. 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   Venia Minks dilation N/A 04/24/2015    Procedure: MALONEY ESOPHAGEAL DILATION (54FR);  Surgeon: [name], MD;  Location: [facility];  Service: Endoscopy;  Laterality: N/A;  . Biopsy  04/24/2015    Procedure: BIOPSY (Gastric);  Surgeon: [name], MD;  Location: [facility];  Service: Endoscopy;;    Prior to Admission medications   Medication Sig Start Date End Date Taking? Authorizing Provider  albuterol (PROAIR HFA) 108 (90 BASE) MCG/ACT inhaler Inhale 2 puffs into the lungs every 6 (six) hours as needed for shortness of breath.     Historical Provider, MD  albuterol (PROVENTIL) (2.5 MG/3ML) 0.083% nebulizer solution Take 2.5 mg by nebulization every 6 (six) hours as needed for shortness of breath (Mixed with Ipratropium).     Historical Provider, MD  ALPRAZolam Duanne Moron) 1 MG tablet Take 1 mg by mouth 4 (four) times daily as needed for anxiety.     Historical Provider, MD  Aspirin-Acetaminophen-Caffeine (GOODY HEADACHE PO) Take 1 Package by mouth daily as needed (Pain).     Historical Provider, MD  azithromycin (ZITHROMAX Z-PAK) 250 MG tablet 2 po day one, then 1 daily x 4 days 10/20/15  Milton Ferguson, MD  clopidogrel (PLAVIX) 75 MG tablet Take 75 mg by mouth daily.  01/25/15   Historical Provider, MD  dicyclomine (BENTYL) 10 MG capsule Take 10 mg by mouth 3 (three) times daily before meals.  09/06/15   Historical Provider, MD  donepezil (ARICEPT) 10 MG tablet Take 10 mg by mouth at bedtime.  11/04/14   Historical Provider, MD  DULERA 100-5 MCG/ACT AERO Take 2 puffs by mouth 2 (two) times daily. 11/04/14   Historical Provider, MD  esomeprazole (NEXIUM) 40 MG capsule Take 40 mg by mouth daily. 11/04/14   Historical Provider, MD  furosemide (LASIX) 20 MG tablet Take 20 mg by mouth every morning.     Historical Provider, MD  ipratropium (ATROVENT) 0.02 % nebulizer solution Take 500 mcg by nebulization every 6 (six)  hours as needed for wheezing (*To be mixed with Albuterol for treatments via nebulization*).    Historical Provider, MD  lisinopril (PRINIVIL,ZESTRIL) 10 MG tablet Take 10 mg by mouth daily.    Historical Provider, MD  loratadine (CLARITIN) 10 MG tablet Take 10 mg by mouth every morning.     Historical Provider, MD  meloxicam (MOBIC) 7.5 MG tablet Take 7.5 mg by mouth every morning.    Historical Provider, MD  Oxycodone HCl 20 MG TABS Take 20 mg by mouth 4 (four) times daily as needed (for pain).  11/04/14   Historical Provider, MD  PARoxetine (PAXIL) 40 MG tablet Take 40 mg by mouth every morning.    Historical Provider, MD  potassium chloride (K-DUR) 10 MEQ tablet Take 1 tablet by mouth daily. 02/22/15   Historical Provider, MD  predniSONE (DELTASONE) 10 MG tablet Take 2 tablets (20 mg total) by mouth daily. 10/20/15   Milton Ferguson, MD  propranolol ER (INDERAL LA) 60 MG 24 hr capsule Take 60 mg by mouth daily.  09/06/15   Historical Provider, MD  SPIRIVA HANDIHALER 18 MCG inhalation capsule Take 18 mcg by mouth daily.  11/04/14   Historical Provider, MD  theophylline (THEODUR) 300 MG 12 hr tablet Take 300 mg by mouth daily.  09/06/15   Historical Provider, MD    Social History:  reports that she has been smoking Cigarettes.  She does not have any smokeless tobacco history on file. She reports that she does not drink alcohol or use illicit drugs.  Family History  Problem Relation Age of Onset  . Diabetes Mother   . Diabetes Father   . Diabetes Brother   . Colon cancer Neg Hx     Review of Systems:  Constitutional: Positive for chills, diaphoresis, appetite change and fatigue.  HEENT: Denies photophobia, eye pain, redness, hearing loss, ear pain, congestion, sore throat, rhinorrhea, sneezing, mouth sores, trouble swallowing, neck pain, neck stiffness and tinnitus.   Respiratory: Denies , chest tightness,  and wheezing.   Cardiovascular: Denies chest pain, palpitations and leg swelling.    Gastrointestinal: Denies nausea, vomiting, abdominal pain, diarrhea, constipation, blood in stool and abdominal distention.  Genitourinary: Denies dysuria, urgency, frequency, hematuria, flank pain and difficulty urinating.  Endocrine: Denies: hot or cold intolerance, sweats, changes in hair or nails, polyuria, polydipsia. Musculoskeletal: Denies myalgias, back pain, joint swelling, arthralgias and gait problem.  Skin: Denies pallor, rash and wound.  Neurological: Denies dizziness, seizures, syncope, weakness, light-headedness, numbness and headaches.  Hematological: Denies adenopathy. Easy bruising, personal or family bleeding history  Psychiatric/Behavioral: Denies suicidal ideation, mood changes, confusion, nervousness, sleep disturbance and agitation   Physical Exam: Blood pressure 153/85, pulse  93, temperature 98.3 F (36.8 C), temperature source Oral, resp. rate 17, height _0  (1.676 m), weight 60.328 kg (133 lb), SpO2 99 %. General: Alert, awake, oriented 3 HEENT: Normocephalic, atraumatic, pupils equal round reactive to light, charcoal movements intact Neck: Supple, no JVD, no lymphadenopathy, no bruits, no goiter Cardiovascular: Regular rate and rhythm, no murmurs, rubs or gallops Lungs: Fair air movement, bilateral expiratory wheezes Abdomen: Soft, nontender, nondistended, positive bowel sounds Extremities: No clubbing, cyanosis or edema, positive pulses Neurologic: Grossly intact and nonfocal  Labs on Admission:  Results for orders placed or performed during the hospital encounter of 11/08/15 (from the past 48 hour(s))  Basic metabolic panel     Status: Abnormal   Collection Time: 11/08/15  7:09 AM  Result Value Ref Range   Sodium 135 135 - 145 mmol/L   Potassium 4.5 3.5 - 5.1 mmol/L   Chloride 92 (L) 101 - 111 mmol/L   CO2 39 (H) 22 - 32 mmol/L   Glucose, Bld 131 (H) 65 - 99 mg/dL   BUN 8 6 - 20 mg/dL   Creatinine, Ser 0.61 0.44 - 1.00 mg/dL   Calcium 9.6 8.9 - 10.3  mg/dL   GFR calc non Af Amer >60 >60 mL/min   GFR calc Af Amer >60 >60 mL/min    Comment: (NOTE) The eGFR has been calculated using the CKD EPI equation. This calculation has not been validated in all clinical situations. eGFR's persistently <60 mL/min signify possible Chronic Kidney Disease.    Anion gap 4 (L) 5 - 15  CBC     Status: Abnormal   Collection Time: 11/08/15  7:09 AM  Result Value Ref Range   WBC 8.7 4.0 - 10.5 K/uL   RBC 4.16 3.87 - 5.11 MIL/uL   Hemoglobin 11.3 (L) 12.0 - 15.0 g/dL   HCT 37.8 36.0 - 46.0 %   MCV 90.9 78.0 - 100.0 fL   MCH 27.2 26.0 - 34.0 pg   MCHC 29.9 (L) 30.0 - 36.0 g/dL   RDW 14.6 11.5 - 15.5 %   Platelets 260 150 - 400 K/uL  Troponin I     Status: None   Collection Time: 11/08/15  7:09 AM  Result Value Ref Range   Troponin I <0.03 <0.031 ng/mL    Comment:        NO INDICATION OF MYOCARDIAL INJURY.   Brain natriuretic peptide     Status: None   Collection Time: 11/08/15  7:09 AM  Result Value Ref Range   B Natriuretic Peptide 94.0 0.0 - 100.0 pg/mL    Radiological Exams on Admission: Dg Chest 2 View  11/08/2015  CLINICAL DATA:  65 year old female with increasing shortness of breath and left chest pain. EXAM: CHEST  2 VIEW COMPARISON:  10/20/2015 and prior exams FINDINGS: The cardiomediastinal silhouette is unremarkable. COPD/ emphysema again identified. Chronic blunting of the costophrenic angles again noted. There is no evidence of airspace disease, pneumothorax, mass, nodule or edema. No acute bony abnormalities are present. Lower cervical spine surgical hardware again noted. IMPRESSION: No evidence of acute cardiopulmonary disease. COPD/ emphysema. Electronically Signed   By: Margarette Canada M.D.   On: 11/08/2015 07:54    Assessment/Plan Principal Problem:   COPD exacerbation (HCC) Active Problems:   Acute on chronic respiratory failure (HCC)   Tobacco use disorder   HTN (hypertension)    Acute on chronic hypoxemic respiratory  failure -Due to COPD with acute exacerbation. -Oxygen as required. -Please see below for details.  COPD with acute exacerbation -Start IV steroids, frequent nebs, Mucinex, Levaquin. -Check influenza PCR, high suspicion given her current symptoms. -Start Tamiflu empirically, can discontinue if influenza testing is negative.  Hypertension -Continue home medications  Tobacco abuse -Counseled on cessation. She is not interested in a nicotine patch.  DVT prophylaxis -Lovenox  CODE STATUS -Full code as discussed with patient and husband at bedside.    Time Spent on Admission: 75 minutes  HERNANDEZ ACOSTA,ESTELA [facility] Pager: [phone] 11/08/2015, 5:26 PM

## 2015-11-08 NOTE — ED Notes (Signed)
Pt ambulated around ER. Pt became short of breath.  Oxygen removed to return to bed and sats dropped to 79 percent after being off for less than 5 minutes.

## 2015-11-08 NOTE — ED Provider Notes (Signed)
CSN: [id number]     Arrival date & time 11/08/15  0458 History   First MD Initiated Contact with Patient 11/08/15 [phone]     Chief Complaint  Patient presents with  . Shortness of Breath     (Consider location/radiation/quality/duration/timing/severity/associated sxs/prior Treatment) HPI Comments: Patient with history of multiple sclerosis, COPD on home oxygen 2 L present with worsening shortness of breath for the past 3 days so the family worse overnight. States she is coughing were not able to bring anything up. Denies fever but has had chills. She has some chest soreness with coughing. Denies any nausea or vomiting or abdominal pain. She was treated for COPD exacerbation in February 16 that she did not get better. She continues to smoke. She has not had increase her oxygen. Endorses rhinorrhea, sore throat, body aches, headache and some abdominal pain with coughing. Multiple sick contacts at home. She did receive a flu shot.  The history is provided by the patient and a relative.    Past Medical History  Diagnosis Date  . COPD (chronic obstructive pulmonary disease) ([place])   . Hypertension   . Sleep apnea   . Depression   . On home O2     2L N/C  . Chronic back pain   . Anxiety and depression   . GERD (gastroesophageal reflux disease)   . Chronic neck pain   . MS (multiple sclerosis) ([place])   . Shortness of breath dyspnea   . Asthma   . MS (multiple sclerosis) ([place])     staes xrays showed signs of ms  . Dementia     possible early onset Alzheimer's   Past Surgical History  Procedure Laterality Date  . Abdominal hysterectomy    . Bladder surgery    . Hemorrhoid surgery    . Carpal tunnel release    . Neck surgery    . Shoulder surgery Right   . Esophagogastroduodenoscopy  2009    Dr. Laural Golden: small sliding hiatal hernia with mild reflux esophagitis, empiric dilation with 54/56 F, nonerosive antral gastritis   . Cholecystectomy    . Breast surgery    .  Esophagogastroduodenoscopy (egd) with propofol N/A 04/24/2015    RMR: normal esophagus status post Maloney dilation. 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   Venia Minks dilation N/A 04/24/2015    Procedure: MALONEY ESOPHAGEAL DILATION (54FR);  Surgeon: [name], MD;  Location: [facility];  Service: Endoscopy;  Laterality: N/A;  . Biopsy  04/24/2015    Procedure: BIOPSY (Gastric);  Surgeon: [name], MD;  Location: [facility];  Service: Endoscopy;;   Family History  Problem Relation Age of Onset  . Diabetes Mother   . Diabetes Father   . Diabetes Brother   . Colon cancer Neg Hx    Social History  Substance Use Topics  . Smoking status: Current Every Day Smoker    Types: Cigarettes  . Smokeless tobacco: None     Comment: 3-4 cigarettes a day  . Alcohol Use: No   OB History    Gravida Para Term Preterm AB TAB SAB Ectopic Multiple Living   '1 1 1            '$ Review of Systems  Constitutional: Positive for activity change, appetite change and fatigue. Negative for fever.  HENT: Positive for congestion and rhinorrhea.   Eyes: Negative for visual disturbance.  Respiratory: Positive for chest tightness and shortness of breath.   Gastrointestinal: Negative for  nausea, vomiting and abdominal pain.  Genitourinary: Negative for dysuria, vaginal bleeding and vaginal discharge.  Musculoskeletal: Positive for myalgias and arthralgias.  Neurological: Positive for dizziness, weakness, light-headedness and headaches.  A complete 10 system review of systems was obtained and all systems are negative except as noted in the HPI and PMH.      Allergies  Review of patient's allergies indicates no known allergies.  Home Medications   Prior to Admission medications   Medication Sig Start Date End Date Taking? Authorizing Provider  albuterol (PROAIR HFA) 108 (90 BASE) MCG/ACT inhaler Inhale 2 puffs into the lungs every 6 (six) hours as needed for shortness of  breath.     Historical Provider, MD  albuterol (PROVENTIL) (2.5 MG/3ML) 0.083% nebulizer solution Take 2.5 mg by nebulization every 6 (six) hours as needed for shortness of breath (Mixed with Ipratropium).     Historical Provider, MD  ALPRAZolam Duanne Moron) 1 MG tablet Take 1 mg by mouth 4 (four) times daily as needed for anxiety.     Historical Provider, MD  Aspirin-Acetaminophen-Caffeine (GOODY HEADACHE PO) Take 1 Package by mouth daily as needed (Pain).     Historical Provider, MD  azithromycin (ZITHROMAX Z-PAK) 250 MG tablet 2 po day one, then 1 daily x 4 days 10/20/15   Milton Ferguson, MD  clopidogrel (PLAVIX) 75 MG tablet Take 75 mg by mouth daily.  01/25/15   Historical Provider, MD  dicyclomine (BENTYL) 10 MG capsule Take 10 mg by mouth 3 (three) times daily before meals.  09/06/15   Historical Provider, MD  donepezil (ARICEPT) 10 MG tablet Take 10 mg by mouth at bedtime.  11/04/14   Historical Provider, MD  DULERA 100-5 MCG/ACT AERO Take 2 puffs by mouth 2 (two) times daily. 11/04/14   Historical Provider, MD  esomeprazole (NEXIUM) 40 MG capsule Take 40 mg by mouth daily. 11/04/14   Historical Provider, MD  furosemide (LASIX) 20 MG tablet Take 20 mg by mouth every morning.     Historical Provider, MD  ipratropium (ATROVENT) 0.02 % nebulizer solution Take 500 mcg by nebulization every 6 (six) hours as needed for wheezing (*To be mixed with Albuterol for treatments via nebulization*).    Historical Provider, MD  lisinopril (PRINIVIL,ZESTRIL) 10 MG tablet Take 10 mg by mouth daily.    Historical Provider, MD  loratadine (CLARITIN) 10 MG tablet Take 10 mg by mouth every morning.     Historical Provider, MD  meloxicam (MOBIC) 7.5 MG tablet Take 7.5 mg by mouth every morning.    Historical Provider, MD  Oxycodone HCl 20 MG TABS Take 20 mg by mouth 4 (four) times daily as needed (for pain).  11/04/14   Historical Provider, MD  PARoxetine (PAXIL) 40 MG tablet Take 40 mg by mouth every morning.    Historical  Provider, MD  potassium chloride (K-DUR) 10 MEQ tablet Take 1 tablet by mouth daily. 02/22/15   Historical Provider, MD  predniSONE (DELTASONE) 10 MG tablet Take 2 tablets (20 mg total) by mouth daily. 10/20/15   Milton Ferguson, MD  propranolol ER (INDERAL LA) 60 MG 24 hr capsule Take 60 mg by mouth daily.  09/06/15   Historical Provider, MD  SPIRIVA HANDIHALER 18 MCG inhalation capsule Take 18 mcg by mouth daily.  11/04/14   Historical Provider, MD  theophylline (THEODUR) 300 MG 12 hr tablet Take 300 mg by mouth daily.  09/06/15   Historical Provider, MD   BP 137/66 mmHg  Pulse 80  Temp(Src) 98.3 F (  36.8 C) (Oral)  Resp 20  Ht '5\' 6"'$  (1.676 m)  Wt 133 lb (60.328 kg)  BMI 21.48 kg/m2  SpO2 93% Physical Exam  Constitutional: She is oriented to person, place, and time. She appears well-developed and well-nourished. She appears distressed.  Speaking in short sentences  HENT:  Head: Normocephalic and atraumatic.  Mouth/Throat: Oropharynx is clear and moist. No oropharyngeal exudate.  Eyes: Conjunctivae and EOM are normal. Pupils are equal, round, and reactive to light.  Neck: Normal range of motion. Neck supple.  No meningismus.  Cardiovascular: Normal rate, regular rhythm, normal heart sounds and intact distal pulses.   No murmur heard. Pulmonary/Chest: Effort normal. No respiratory distress. She has wheezes. She exhibits no tenderness.  Decreased breath sounds throughout with faint expiratory wheezing  Abdominal: Soft. There is no tenderness. There is no rebound and no guarding.  Musculoskeletal: Normal range of motion. She exhibits no edema or tenderness.  Neurological: She is alert and oriented to person, place, and time. No cranial nerve deficit. She exhibits normal muscle tone. Coordination normal.  No ataxia on finger to nose bilaterally. No pronator drift. 5/5 strength throughout. CN 2-12 intact.Equal grip strength. Sensation intact.   Skin: Skin is warm.  Psychiatric: She has a  normal mood and affect. Her behavior is normal.  Nursing note and vitals reviewed.   ED Course  Procedures (including critical care time) Labs Review Labs Reviewed  BASIC METABOLIC PANEL - Abnormal; Notable for the following:    Chloride 92 (*)    CO2 39 (*)    Glucose, Bld 131 (*)    Anion gap 4 (*)    All other components within normal limits  CBC - Abnormal; Notable for the following:    Hemoglobin 11.3 (*)    MCHC 29.9 (*)    All other components within normal limits  TROPONIN I  BRAIN NATRIURETIC PEPTIDE    Imaging Review Dg Chest 2 View  11/08/2015  CLINICAL DATA:  65 year old female with increasing shortness of breath and left chest pain. EXAM: CHEST  2 VIEW COMPARISON:  10/20/2015 and prior exams FINDINGS: The cardiomediastinal silhouette is unremarkable. COPD/ emphysema again identified. Chronic blunting of the costophrenic angles again noted. There is no evidence of airspace disease, pneumothorax, mass, nodule or edema. No acute bony abnormalities are present. Lower cervical spine surgical hardware again noted. IMPRESSION: No evidence of acute cardiopulmonary disease. COPD/ emphysema. Electronically Signed   By: Margarette Canada M.D.   On: 11/08/2015 07:54   I have personally reviewed and evaluated these images and lab results as part of my medical decision-making.   EKG Interpretation   Date/Time:  Saturday November 08 2015 07:23:54 EST Ventricular Rate:  94 PR Interval:  105 QRS Duration: 86 QT Interval:  350 QTC Calculation: 438 R Axis:   62 Text Interpretation:  Ectopic atrial rhythm Short PR interval No  significant change was found Confirmed by Wyvonnia Dusky  MD, Ruari Duggan ([id number]) on  11/08/2015 7:29:52 AM      MDM   Final diagnoses:  COPD exacerbation (HCC)   4 days of shortness of breath, nonproductive cough, chest pain with coughing. Decreased breath sounds on exam.  Nebulizers and steroids. EKG unchanged.  Chest x-ray is negative for infiltrate. Labs are at  baseline. Patient's work of breathing has improved after 2 nebulizers and steroids. She has however hypoxic with ambulation despite her home O2 and dropped to 79% and becomes quite dyspneic. She'll need admission for ongoing COPD exacerbation.  D/w Dr.  Jerilee Hoh.  Ezequiel Essex, MD 11/08/15 [phone]

## 2015-11-08 NOTE — ED Notes (Signed)
Pt states worsening sob overnight, also having some pain to left ribcage area, worse with breathing

## 2015-11-08 NOTE — ED Notes (Signed)
Report called to Dept 300, all questions answered

## 2015-11-09 DIAGNOSIS — J441 Chronic obstructive pulmonary disease with (acute) exacerbation: Secondary | ICD-10-CM | POA: Diagnosis not present

## 2015-11-09 DIAGNOSIS — I10 Essential (primary) hypertension: Secondary | ICD-10-CM | POA: Diagnosis not present

## 2015-11-09 DIAGNOSIS — J9621 Acute and chronic respiratory failure with hypoxia: Secondary | ICD-10-CM | POA: Diagnosis not present

## 2015-11-09 LAB — CBC
HCT: 38.7 % (ref 36.0–46.0)
Hemoglobin: 11.9 g/dL — ABNORMAL LOW (ref 12.0–15.0)
MCH: 26.9 pg (ref 26.0–34.0)
MCHC: 30.7 g/dL (ref 30.0–36.0)
MCV: 87.6 fL (ref 78.0–100.0)
PLATELETS: 295 10*3/uL (ref 150–400)
RBC: 4.42 MIL/uL (ref 3.87–5.11)
RDW: 14.5 % (ref 11.5–15.5)
WBC: 8.1 10*3/uL (ref 4.0–10.5)

## 2015-11-09 LAB — BASIC METABOLIC PANEL
Anion gap: 7 (ref 5–15)
BUN: 10 mg/dL (ref 6–20)
CO2: 37 mmol/L — ABNORMAL HIGH (ref 22–32)
CREATININE: 0.67 mg/dL (ref 0.44–1.00)
Calcium: 9.9 mg/dL (ref 8.9–10.3)
Chloride: 90 mmol/L — ABNORMAL LOW (ref 101–111)
GFR calc Af Amer: >60 mL/min (ref >60)
Glucose, Bld: 138 mg/dL — ABNORMAL HIGH (ref 65–99)
Potassium: 4.6 mmol/L (ref 3.5–5.1)
SODIUM: 134 mmol/L — AB (ref 135–145)

## 2015-11-09 MED ORDER — METHYLPREDNISOLONE SODIUM SUCC 125 MG IJ SOLR
60.0000 mg | Freq: Two times a day (BID) | INTRAMUSCULAR | Status: DC
  Administered 2015-11-09 – 2015-11-10 (×2): 60 mg via INTRAVENOUS
  Filled 2015-11-09 (×2): qty 2

## 2015-11-09 NOTE — Progress Notes (Signed)
[facility] PROGRESS NOTE  Olivia Randall IRC:[id number] DOB: [birth year] DOA: 11/08/2015 PCP: Terald Sleeper, PA-C  Assessment/Plan: Acute on Chronic Hypoxemic Respiratory Failure -Due to COPD with exacerbation. -Continue oxygen  COPD with acute exacerbation -Much improved. -Continue steroids (start titrating), nebs, mucinex, levaquin. -Flu PCR negative, will DC tamiflu.  HTN -Continue home medications  Tobacco Abuse -Counseled on cessation.  Code Status: Full Code Family Communication: Husband at bedside updated on plan of care and all questions answered.  Disposition Plan: Home when ready, anticipate 24 hours.   Consultants:  None   Antibiotics:  Levaquin   Subjective: Feels much improved, no SOB  Objective: Filed Vitals:   11/08/15 2236 11/08/15 2343 11/09/15 0715 11/09/15 1144  BP: 165/79  139/83   Pulse: 101  100   Temp: 98.2 F (36.8 C)  98.3 F (36.8 C)   TempSrc: Oral  Oral   Resp: 17  20   Height:      Weight:      SpO2: 100% 98% 100% 96%    Intake/Output Summary (Last 24 hours) at 11/09/15 1217 Last data filed at 11/09/15 0800  Gross per 24 hour  Intake    480 ml  Output      0 ml  Net    480 ml   Filed Weights   11/08/15 0521  Weight: 60.328 kg (133 lb)    Exam:   General:  AA Ox3  Cardiovascular: RRR  Respiratory: CTA B  Abdomen: S/NT/ND/+BS  Extremities: no C/C/E   Neurologic:  Non-focal  Data Reviewed: Basic Metabolic Panel:  Recent Labs Lab 11/08/15 0709 11/09/15 0626  NA 135 134*  K 4.5 4.6  CL 92* 90*  CO2 39* 37*  GLUCOSE 131* 138*  BUN 8 10  CREATININE 0.61 0.67  CALCIUM 9.6 9.9   Liver Function Tests: No results for input(s): AST, ALT, ALKPHOS, BILITOT, PROT, ALBUMIN in the last 168 hours. No results for input(s): LIPASE, AMYLASE in the last 168 hours. No results for input(s): AMMONIA in the last 168 hours. CBC:  Recent Labs Lab 11/08/15 0709 11/09/15 0626  WBC 8.7 8.1  HGB 11.3*  11.9*  HCT 37.8 38.7  MCV 90.9 87.6  PLT 260 295   Cardiac Enzymes:  Recent Labs Lab 11/08/15 0709  TROPONINI <0.03   BNP (last 3 results)  Recent Labs  01/07/15 1757 10/20/15 1250 11/08/15 0709  BNP 31.0 45.0 94.0    ProBNP (last 3 results) No results for input(s): PROBNP in the last 8760 hours.  CBG: No results for input(s): GLUCAP in the last 168 hours.  No results found for this or any previous visit (from the past 240 hour(s)).   Studies: Dg Chest 2 View  11/08/2015  CLINICAL DATA:  65 year old female with increasing shortness of breath and left chest pain. EXAM: CHEST  2 VIEW COMPARISON:  10/20/2015 and prior exams FINDINGS: The cardiomediastinal silhouette is unremarkable. COPD/ emphysema again identified. Chronic blunting of the costophrenic angles again noted. There is no evidence of airspace disease, pneumothorax, mass, nodule or edema. No acute bony abnormalities are present. Lower cervical spine surgical hardware again noted. IMPRESSION: No evidence of acute cardiopulmonary disease. COPD/ emphysema. Electronically Signed   By: Margarette Canada M.D.   On: 11/08/2015 07:54    Scheduled Meds: . clopidogrel  75 mg Oral Daily  . dicyclomine  10 mg Oral TID AC  . donepezil  10 mg Oral QHS  . enoxaparin (LOVENOX) injection  40  mg Subcutaneous Q24H  . furosemide  20 mg Oral q morning - 10a  . guaiFENesin  1,200 mg Oral BID  . levofloxacin  750 mg Oral Daily  . lisinopril  10 mg Oral Daily  . loratadine  10 mg Oral q morning - 10a  . methylPREDNISolone (SOLU-MEDROL) injection  60 mg Intravenous Q6H  . mometasone-formoterol  2 puff Inhalation BID  . pantoprazole  40 mg Oral Daily  . PARoxetine  40 mg Oral BH-q7a  . potassium chloride  10 mEq Oral Daily  . propranolol ER  60 mg Oral Daily  . sodium chloride flush  3 mL Intravenous Q12H  . theophylline  300 mg Oral Daily  . tiotropium  18 mcg Inhalation Daily   Continuous Infusions:   Principal Problem:   COPD  exacerbation (HCC) Active Problems:   Acute on chronic respiratory failure (HCC)   Tobacco use disorder   HTN (hypertension)    Time spent: 25 minutes. Greater than 50% of this time was spent in direct contact with the patient coordinating care.    Lelon Frohlich  [facility] Pager [phone]  If 7PM-7AM, please contact night-coverage at [email or web address], password [facility] 11/09/2015, 12:17 PM       \

## 2015-11-10 DIAGNOSIS — J441 Chronic obstructive pulmonary disease with (acute) exacerbation: Secondary | ICD-10-CM | POA: Diagnosis not present

## 2015-11-10 DIAGNOSIS — J9621 Acute and chronic respiratory failure with hypoxia: Secondary | ICD-10-CM | POA: Diagnosis not present

## 2015-11-10 MED ORDER — LEVOFLOXACIN 750 MG PO TABS: 750.0000 mg | ORAL_TABLET | Freq: Every day | ORAL | Status: DC

## 2015-11-10 MED ORDER — GUAIFENESIN ER 600 MG PO TB12: 1200.0000 mg | ORAL_TABLET | Freq: Two times a day (BID) | ORAL | Status: DC

## 2015-11-10 NOTE — Discharge Summary (Signed)
Physician Discharge Summary  Shiva [facility] VQQ:[id number] DOB: 1950-10-16 DOA: 11/08/2015  PCP: Terald Sleeper, PA-C  Admit date: 11/08/2015 Discharge date: 11/10/2015  Time spent: 45 minutes  Recommendations for Outpatient Follow-up:  -Will be discharged home today. -Advised to follow up with PCP in 2 weeks.   Discharge Diagnoses:  Principal Problem:   COPD exacerbation ([place]) Active Problems:   Acute on chronic respiratory failure (HCC)   Tobacco use disorder   HTN (hypertension)   Discharge Condition: Stable and improved  Filed Weights   11/08/15 0521  Weight: 60.328 kg (133 lb)    History of present illness:  Patient is a 64 year old woman with history of COPD on chronic oxygen 2 L and multiple sclerosis who presents to the hospital today with a three-day history of increasing shortness of breath. She states she has had chills but no fever, severe muscle aches, headache, she has had multiple sick contacts. She was admitted given nebulizer treatments and steroids, however she continued to have wheezing, was more hypoxemic than usual with sats dipping into the 70s on her usual regimen of 2 L. Admission was requested for further evaluation and management. In the emergency department labs were essentially within normal limits, chest x-ray with no acute findings but definite evidence for COPD.  Hospital Course:   Acute on Chronic Hypoxemic Respiratory Failure -Due to COPD with exacerbation. -Continue oxygen.  COPD with acute exacerbation -Much improved. -Dc on a prednisone taper, nebs, mucinex, levaquin. -Flu PCR negative.  HTN -Continue home medications  Tobacco Abuse -Counseled on cessation.  Procedures:  None   Consultations:  None  Discharge Instructions  Discharge Instructions    Diet - low sodium heart healthy    Complete by:  As directed      Increase activity slowly    Complete by:  As directed             Medication List    STOP taking  these medications        azithromycin 250 MG tablet  Commonly known as:  ZITHROMAX Z-PAK     ipratropium 0.02 % nebulizer solution  Commonly known as:  ATROVENT      TAKE these medications        ALPRAZolam 1 MG tablet  Commonly known as:  XANAX  Take 1 mg by mouth 4 (four) times daily as needed for anxiety.     clopidogrel 75 MG tablet  Commonly known as:  PLAVIX  Take 75 mg by mouth daily.     dicyclomine 10 MG capsule  Commonly known as:  BENTYL  Take 10 mg by mouth 3 (three) times daily before meals.     donepezil 10 MG tablet  Commonly known as:  ARICEPT  Take 10 mg by mouth at bedtime.     DULERA 100-5 MCG/ACT Aero  Generic drug:  mometasone-formoterol  Take 2 puffs by mouth 2 (two) times daily.     esomeprazole 40 MG capsule  Commonly known as:  NEXIUM  Take 40 mg by mouth daily.     furosemide 20 MG tablet  Commonly known as:  LASIX  Take 20 mg by mouth every morning.     GOODY HEADACHE PO  Take 1 Package by mouth daily as needed (Pain).     guaiFENesin 600 MG 12 hr tablet  Commonly known as:  MUCINEX  Take 2 tablets (1,200 mg total) by mouth 2 (two) times daily.     levofloxacin 750 MG  tablet  Commonly known as:  LEVAQUIN  Take 1 tablet (750 mg total) by mouth daily.     lisinopril 10 MG tablet  Commonly known as:  PRINIVIL,ZESTRIL  Take 10 mg by mouth daily.     loratadine 10 MG tablet  Commonly known as:  CLARITIN  Take 10 mg by mouth every morning.     meloxicam 7.5 MG tablet  Commonly known as:  MOBIC  Take 7.5 mg by mouth every morning.     Oxycodone HCl 20 MG Tabs  Take 20 mg by mouth 4 (four) times daily as needed (for pain).     PARoxetine 40 MG tablet  Commonly known as:  PAXIL  Take 40 mg by mouth every morning.     potassium chloride 10 MEQ tablet  Commonly known as:  K-DUR  Take 1 tablet by mouth daily.     predniSONE 10 MG tablet  Commonly known as:  DELTASONE  Take 2 tablets (20 mg total) by mouth daily.     PROAIR  HFA 108 (90 Base) MCG/ACT inhaler  Generic drug:  albuterol  Inhale 2 puffs into the lungs every 6 (six) hours as needed for shortness of breath.     albuterol (2.5 MG/3ML) 0.083% nebulizer solution  Commonly known as:  PROVENTIL  Take 2.5 mg by nebulization every 6 (six) hours as needed for shortness of breath (Mixed with Ipratropium).     propranolol ER 60 MG 24 hr capsule  Commonly known as:  INDERAL LA  Take 60 mg by mouth daily.     SPIRIVA HANDIHALER 18 MCG inhalation capsule  Generic drug:  tiotropium  Take 18 mcg by mouth daily.     theophylline 300 MG 12 hr tablet  Commonly known as:  THEODUR  Take 300 mg by mouth daily.       No Known Allergies     Follow-up Information    Follow up with Terald Sleeper, PA-C. Schedule an appointment as soon as possible for a visit in 2 weeks.   Specialty:  General Practice   Contact information:   [address] [place] [address] [phone]        The results of significant diagnostics from this hospitalization (including imaging, microbiology, ancillary and laboratory) are listed below for reference.    Significant Diagnostic Studies: Dg Chest 2 View  11/08/2015  CLINICAL DATA:  65 year old female with increasing shortness of breath and left chest pain. EXAM: CHEST  2 VIEW COMPARISON:  10/20/2015 and prior exams FINDINGS: The cardiomediastinal silhouette is unremarkable. COPD/ emphysema again identified. Chronic blunting of the costophrenic angles again noted. There is no evidence of airspace disease, pneumothorax, mass, nodule or edema. No acute bony abnormalities are present. Lower cervical spine surgical hardware again noted. IMPRESSION: No evidence of acute cardiopulmonary disease. COPD/ emphysema. Electronically Signed   By: Margarette Canada M.D.   On: 11/08/2015 07:54   Dg Chest 2 View  10/20/2015  CLINICAL DATA:  Three-day history of chest pain, left-sided, and productive cough EXAM: CHEST  2 VIEW COMPARISON:  September 08, 2015 FINDINGS: There is scarring in the lung bases with chronic blunting of the right costophrenic angle. Lungs are somewhat hyperexpanded. There is no edema or consolidation. There is right lower lobe scarring, stable. Heart size and pulmonary vascularity are normal. No adenopathy. No pneumothorax. There is postoperative change in the lower cervical region. IMPRESSION: Lungs hyperexpanded. Scarring in right lower lobe with chronic blunting of the right costophrenic  angle. No edema or consolidation. No change in cardiac silhouette. Electronically Signed   By: Lowella Grip III M.D.   On: 10/20/2015 12:42    Microbiology: No results found for this or any previous visit (from the past 240 hour(s)).   Labs: Basic Metabolic Panel:  Recent Labs Lab 11/08/15 0709 11/09/15 0626  NA 135 134*  K 4.5 4.6  CL 92* 90*  CO2 39* 37*  GLUCOSE 131* 138*  BUN 8 10  CREATININE 0.61 0.67  CALCIUM 9.6 9.9   Liver Function Tests: No results for input(s): AST, ALT, ALKPHOS, BILITOT, PROT, ALBUMIN in the last 168 hours. No results for input(s): LIPASE, AMYLASE in the last 168 hours. No results for input(s): AMMONIA in the last 168 hours. CBC:  Recent Labs Lab 11/08/15 0709 11/09/15 0626  WBC 8.7 8.1  HGB 11.3* 11.9*  HCT 37.8 38.7  MCV 90.9 87.6  PLT 260 295   Cardiac Enzymes:  Recent Labs Lab 11/08/15 0709  TROPONINI <0.03   BNP: BNP (last 3 results)  Recent Labs  01/07/15 1757 10/20/15 1250 11/08/15 0709  BNP 31.0 45.0 94.0    ProBNP (last 3 results) No results for input(s): PROBNP in the last 8760 hours.  CBG: No results for input(s): GLUCAP in the last 168 hours.     SignedLelon Frohlich  [facility] Pager: [phone] 11/10/2015, 1:35 PM

## 2015-11-10 NOTE — Care Management Note (Signed)
Case Management Note  Patient Details  Name: Olivia Randall MRN: [id number] Date of Birth: Jul 22, 1951  Subjective/Objective:                  Pt admitted with COPD exacerbation. Pt is from home, lives with husband and is ind with ADL's. PT has home O2 and neb prior to admission. Pt plans to return home with self care today.   Action/Plan: No CM needs.   Expected Discharge Date:  11/10/15               Expected Discharge Plan:  Home/Self Care  In-House Referral:  NA  Discharge planning Services  CM Consult  Post Acute Care Choice:  NA Choice offered to:  NA  DME Arranged:    DME Agency:     HH Arranged:    HH Agency:     Status of Service:  Completed, signed off  Medicare Important Message Given:    Date Medicare IM Given:    Medicare IM give by:    Date Additional Medicare IM Given:    Additional Medicare Important Message give by:     If discussed at [place] of Stay Meetings, dates discussed:    Additional Comments:  Sherald Barge, RN 11/10/2015, 1:57 PM

## 2015-11-10 NOTE — Progress Notes (Signed)
Pt's IV catheter removed and intact. Pt's IV site clean dry and intact. Discharge instructions follow up appointments and medications were reviewed and discussed with patient. Pt verbalized understanding of discharge instructions including medications and follow up appointments. All questions were answered and no further questions at this time. Pt in stable condition and in no acute distress at time of discharge. Pt escorted by nurse tech.

## 2015-12-16 ENCOUNTER — Telehealth: Payer: Self-pay | Admitting: Internal Medicine

## 2015-12-16 NOTE — Telephone Encounter (Signed)
No medication for dysphagia. I would send her information on fa ull liquid diet with swallowing precautions.

## 2015-12-16 NOTE — Telephone Encounter (Signed)
Tried to call pt to tell her about full liquid diet- LM to return call.

## 2015-12-16 NOTE — Telephone Encounter (Signed)
Manuela Schwartz called pt and offered earlier appt for tomorrow at 9am. Pt couldn't come tomorrow, she offered appt on Monday 12/22/15 and pt accepted that appt. Manuela Schwartz cancelled her appt for 01/07/16

## 2015-12-16 NOTE — Telephone Encounter (Signed)
See other phone note

## 2015-12-16 NOTE — Telephone Encounter (Signed)
Please call patient back at [phone]. She had called earlier this morning asking about getting something for dysphagia to hold her until her OV

## 2015-12-16 NOTE — Telephone Encounter (Signed)
Pt is aware of new OV on 4/10 at 11 with EG

## 2015-12-16 NOTE — Telephone Encounter (Signed)
Pt called to make OV for dysphagia and asked if we could call something in to help her until her OV. Please advise [phone]

## 2015-12-17 NOTE — Telephone Encounter (Signed)
Pt is aware of full liquids

## 2015-12-22 ENCOUNTER — Ambulatory Visit: Payer: Medicare HMO | Admitting: Nurse Practitioner

## 2016-01-07 ENCOUNTER — Ambulatory Visit: Payer: Medicare HMO | Admitting: Gastroenterology

## 2016-01-13 ENCOUNTER — Ambulatory Visit: Payer: Medicare HMO | Admitting: Nurse Practitioner

## 2016-01-29 ENCOUNTER — Encounter (HOSPITAL_COMMUNITY): Payer: Self-pay | Admitting: Emergency Medicine

## 2016-01-29 ENCOUNTER — Emergency Department (HOSPITAL_COMMUNITY): Payer: Medicare HMO

## 2016-01-29 ENCOUNTER — Emergency Department (HOSPITAL_COMMUNITY)
Admission: EM | Admit: 2016-01-29 | Discharge: 2016-01-29 | Disposition: A | Discharge status: Home / Self Care | Payer: Medicare HMO | Attending: Emergency Medicine | Admitting: Emergency Medicine

## 2016-01-29 DIAGNOSIS — J45909 Unspecified asthma, uncomplicated: Secondary | ICD-10-CM | POA: Insufficient documentation

## 2016-01-29 DIAGNOSIS — Z79899 Other long term (current) drug therapy: Secondary | ICD-10-CM | POA: Insufficient documentation

## 2016-01-29 DIAGNOSIS — F329 Major depressive disorder, single episode, unspecified: Secondary | ICD-10-CM | POA: Diagnosis not present

## 2016-01-29 DIAGNOSIS — Z7982 Long term (current) use of aspirin: Secondary | ICD-10-CM | POA: Diagnosis not present

## 2016-01-29 DIAGNOSIS — Z79891 Long term (current) use of opiate analgesic: Secondary | ICD-10-CM | POA: Insufficient documentation

## 2016-01-29 DIAGNOSIS — F17210 Nicotine dependence, cigarettes, uncomplicated: Secondary | ICD-10-CM | POA: Insufficient documentation

## 2016-01-29 DIAGNOSIS — J441 Chronic obstructive pulmonary disease with (acute) exacerbation: Secondary | ICD-10-CM | POA: Diagnosis not present

## 2016-01-29 DIAGNOSIS — I10 Essential (primary) hypertension: Secondary | ICD-10-CM | POA: Insufficient documentation

## 2016-01-29 DIAGNOSIS — R0602 Shortness of breath: Secondary | ICD-10-CM | POA: Diagnosis present

## 2016-01-29 LAB — CBC
HCT: 40.2 % (ref 36.0–46.0)
Hemoglobin: 12 g/dL (ref 12.0–15.0)
MCH: 26.8 pg (ref 26.0–34.0)
MCHC: 29.9 g/dL — AB (ref 30.0–36.0)
MCV: 89.7 fL (ref 78.0–100.0)
Platelets: 258 10*3/uL (ref 150–400)
RBC: 4.48 MIL/uL (ref 3.87–5.11)
RDW: 14.9 % (ref 11.5–15.5)
WBC: 7.6 10*3/uL (ref 4.0–10.5)

## 2016-01-29 LAB — BASIC METABOLIC PANEL
Anion gap: 6 (ref 5–15)
BUN: 7 mg/dL (ref 6–20)
CO2: 39 mmol/L — ABNORMAL HIGH (ref 22–32)
CREATININE: 0.65 mg/dL (ref 0.44–1.00)
Calcium: 9.5 mg/dL (ref 8.9–10.3)
Chloride: 90 mmol/L — ABNORMAL LOW (ref 101–111)
Glucose, Bld: 146 mg/dL — ABNORMAL HIGH (ref 65–99)
Potassium: 3.6 mmol/L (ref 3.5–5.1)
SODIUM: 135 mmol/L (ref 135–145)

## 2016-01-29 LAB — TROPONIN I: Troponin I: <0.03 ng/mL (ref <0.031)

## 2016-01-29 LAB — BRAIN NATRIURETIC PEPTIDE: B NATRIURETIC PEPTIDE 5: 36 pg/mL (ref 0.0–100.0)

## 2016-01-29 MED ORDER — ALBUTEROL SULFATE (2.5 MG/3ML) 0.083% IN NEBU
5.0000 mg | INHALATION_SOLUTION | Freq: Once | RESPIRATORY_TRACT | Status: AC
  Administered 2016-01-29: 5 mg via RESPIRATORY_TRACT
  Filled 2016-01-29: qty 6

## 2016-01-29 MED ORDER — METHYLPREDNISOLONE SODIUM SUCC 125 MG IJ SOLR
125.0000 mg | Freq: Once | INTRAMUSCULAR | Status: AC
  Administered 2016-01-29: 125 mg via INTRAVENOUS
  Filled 2016-01-29: qty 2

## 2016-01-29 MED ORDER — IPRATROPIUM BROMIDE 0.02 % IN SOLN
0.5000 mg | Freq: Once | RESPIRATORY_TRACT | Status: AC
  Administered 2016-01-29: 0.5 mg via RESPIRATORY_TRACT
  Filled 2016-01-29: qty 2.5

## 2016-01-29 MED ORDER — PREDNISONE 10 MG (21) PO TBPK: ORAL_TABLET | ORAL | Status: DC

## 2016-01-29 NOTE — ED Notes (Signed)
Patient with no complaints at this time. Respirations even and unlabored. Skin warm/dry. Discharge instructions reviewed with patient at this time. Patient given opportunity to voice concerns/ask questions. IV removed per policy and band-aid applied to site. Patient discharged at this time and left Emergency Department with steady gait.  

## 2016-01-29 NOTE — Discharge Instructions (Signed)
Chronic Obstructive Pulmonary Disease Chronic obstructive pulmonary disease (COPD) is a common lung condition in which airflow from the lungs is limited. COPD is a general term that can be used to describe many different lung problems that limit airflow, including both chronic bronchitis and emphysema. If you have COPD, your lung function will probably never return to normal, but there are measures you can take to improve lung function and make yourself feel better. CAUSES   Smoking (common).  Exposure to secondhand smoke.  Genetic problems.  Chronic inflammatory lung diseases or recurrent infections. SYMPTOMS  Shortness of breath, especially with physical activity.  Deep, persistent (chronic) cough with a large amount of thick mucus.  Wheezing.  Rapid breaths (tachypnea).  Gray or bluish discoloration (cyanosis) of the skin, especially in your fingers, toes, or lips.  Fatigue.  Weight loss.  Frequent infections or episodes when breathing symptoms become much worse (exacerbations).  Chest tightness. DIAGNOSIS Your health care provider will take a medical history and perform a physical examination to diagnose COPD. Additional tests for COPD may include:  Lung (pulmonary) function tests.  Chest X-ray.  CT scan.  Blood tests. TREATMENT  Treatment for COPD may include:  Inhaler and nebulizer medicines. These help manage the symptoms of COPD and make your breathing more comfortable.  Supplemental oxygen. Supplemental oxygen is only helpful if you have a low oxygen level in your blood.  Exercise and physical activity. These are beneficial for nearly all people with COPD.  Lung surgery or transplant.  Nutrition therapy to gain weight, if you are underweight.  Pulmonary rehabilitation. This may involve working with a team of health care providers and specialists, such as respiratory, occupational, and physical therapists. HOME CARE INSTRUCTIONS  Take all medicines  (inhaled or pills) as directed by your health care provider.  Avoid over-the-counter medicines or cough syrups that dry up your airway (such as antihistamines) and slow down the elimination of secretions unless instructed otherwise by your health care provider.  If you are a smoker, the most important thing that you can do is stop smoking. Continuing to smoke will cause further lung damage and breathing trouble. Ask your health care provider for help with quitting smoking. He or she can direct you to community resources or hospitals that provide support.  Avoid exposure to irritants such as smoke, chemicals, and fumes that aggravate your breathing.  Use oxygen therapy and pulmonary rehabilitation if directed by your health care provider. If you require home oxygen therapy, ask your health care provider whether you should purchase a pulse oximeter to measure your oxygen level at home.  Avoid contact with individuals who have a contagious illness.  Avoid extreme temperature and humidity changes.  Eat healthy foods. Eating smaller, more frequent meals and resting before meals may help you maintain your strength.  Stay active, but balance activity with periods of rest. Exercise and physical activity will help you maintain your ability to do things you want to do.  Preventing infection and hospitalization is very important when you have COPD. Make sure to receive all the vaccines your health care provider recommends, especially the pneumococcal and influenza vaccines. Ask your health care provider whether you need a pneumonia vaccine.  Learn and use relaxation techniques to manage stress.  Learn and use controlled breathing techniques as directed by your health care provider. Controlled breathing techniques include:  Pursed lip breathing. Start by breathing in (inhaling) through your nose for 1 second. Then, purse your lips as if you were   going to whistle and breathe out (exhale) through the  pursed lips for 2 seconds.  Diaphragmatic breathing. Start by putting one hand on your abdomen just above your waist. Inhale slowly through your nose. The hand on your abdomen should move out. Then purse your lips and exhale slowly. You should be able to feel the hand on your abdomen moving in as you exhale.  Learn and use controlled coughing to clear mucus from your lungs. Controlled coughing is a series of short, progressive coughs. The steps of controlled coughing are: 1. Lean your head slightly forward. 2. Breathe in deeply using diaphragmatic breathing. 3. Try to hold your breath for 3 seconds. 4. Keep your mouth slightly open while coughing twice. 5. Spit any mucus out into a tissue. 6. Rest and repeat the steps once or twice as needed. SEEK MEDICAL CARE IF:  You are coughing up more mucus than usual.  There is a change in the color or thickness of your mucus.  Your breathing is more labored than usual.  Your breathing is faster than usual. SEEK IMMEDIATE MEDICAL CARE IF:  You have shortness of breath while you are resting.  You have shortness of breath that prevents you from:  Being able to talk.  Performing your usual physical activities.  You have chest pain lasting longer than 5 minutes.  Your skin color is more cyanotic than usual.  You measure low oxygen saturations for longer than 5 minutes with a pulse oximeter. MAKE SURE YOU:  Understand these instructions.  Will watch your condition.  Will get help right away if you are not doing well or get worse.   This information is not intended to replace advice given to you by your health care provider. Make sure you discuss any questions you have with your health care provider.   Document Released: 06/09/2005 Document Revised: 09/20/2014 Document Reviewed: 04/26/2013 Elsevier Interactive Patient Education 2016 [employer]  

## 2016-01-29 NOTE — ED Notes (Signed)
Increasing SOB started today.  On 2 liters of O2 at home.  C/o pain under left breast.

## 2016-01-29 NOTE — ED Provider Notes (Signed)
CSN: [id number]     Arrival date & time 01/29/16  1419 History   First MD Initiated Contact with Patient 01/29/16 1455     Chief Complaint  Patient presents with  . Shortness of Breath     HPI Pt started having pain in her chest last night.  It is a sharp pain on the left side.  It does not radiate.  No back or jaw pain but she does have some pain in her left neck.  The pain comes and goes but she is left with a soreness.  She does feel short of breath.  She has COPD and uses oxygen but today it is worse.  No history of heart disease but it does run in the family.  Past Medical History  Diagnosis Date  . COPD (chronic obstructive pulmonary disease) ([place])   . Hypertension   . Sleep apnea   . Depression   . On home O2     2L N/C  . Chronic back pain   . Anxiety and depression   . GERD (gastroesophageal reflux disease)   . Chronic neck pain   . MS (multiple sclerosis) ([place])   . Shortness of breath dyspnea   . Asthma   . MS (multiple sclerosis) ([place])     staes xrays showed signs of ms  . Dementia     possible early onset Alzheimer's   Past Surgical History  Procedure Laterality Date  . Abdominal hysterectomy    . Bladder surgery    . Hemorrhoid surgery    . Carpal tunnel release    . Neck surgery    . Shoulder surgery Right   . Esophagogastroduodenoscopy  2009    Dr. Laural Golden: small sliding hiatal hernia with mild reflux esophagitis, empiric dilation with 54/56 F, nonerosive antral gastritis   . Cholecystectomy    . Breast surgery    . Esophagogastroduodenoscopy (egd) with propofol N/A 04/24/2015    RMR: normal esophagus status post Maloney dilation. 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   Venia Minks dilation N/A 04/24/2015    Procedure: MALONEY ESOPHAGEAL DILATION (54FR);  Surgeon: [name], MD;  Location: [facility];  Service: Endoscopy;  Laterality: N/A;  . Biopsy  04/24/2015    Procedure: BIOPSY (Gastric);  Surgeon: [name], MD;   Location: [facility];  Service: Endoscopy;;   Family History  Problem Relation Age of Onset  . Diabetes Mother   . Diabetes Father   . Diabetes Brother   . Colon cancer Neg Hx    Social History  Substance Use Topics  . Smoking status: Current Every Day Smoker    Types: Cigarettes  . Smokeless tobacco: None     Comment: 3-4 cigarettes a day  . Alcohol Use: No   OB History    Gravida Para Term Preterm AB TAB SAB Ectopic Multiple Living   '1 1 1            '$ Review of Systems  All other systems reviewed and are negative.     Allergies  Review of patient's allergies indicates no known allergies.  Home Medications   Prior to Admission medications   Medication Sig Start Date End Date Taking? Authorizing Provider  ADVAIR HFA 230-21 MCG/ACT inhaler Inhale 1 puff into the lungs 2 (two) times daily. 01/24/16  Yes Historical Provider, MD  albuterol (PROAIR HFA) 108 (90 BASE) MCG/ACT inhaler Inhale 2 puffs into the lungs every 6 (six) hours as needed  for shortness of breath.    Yes Historical Provider, MD  albuterol (PROVENTIL) (2.5 MG/3ML) 0.083% nebulizer solution Take 2.5 mg by nebulization every 6 (six) hours as needed for shortness of breath (Mixed with Ipratropium).    Yes Historical Provider, MD  ALPRAZolam Duanne Moron) 1 MG tablet Take 1 mg by mouth 4 (four) times daily as needed for anxiety.    Yes Historical Provider, MD  Aspirin-Acetaminophen-Caffeine (GOODY HEADACHE PO) Take 1 Package by mouth daily as needed (Pain).    Yes Historical Provider, MD  clopidogrel (PLAVIX) 75 MG tablet Take 75 mg by mouth daily.  01/25/15  Yes Historical Provider, MD  donepezil (ARICEPT) 10 MG tablet Take 10 mg by mouth at bedtime.  11/04/14  Yes Historical Provider, MD  esomeprazole (NEXIUM) 40 MG capsule Take 40 mg by mouth daily. 11/04/14  Yes Historical Provider, MD  furosemide (LASIX) 20 MG tablet Take 20 mg by mouth every morning.    Yes Historical Provider, MD  lisinopril (PRINIVIL,ZESTRIL) 10 MG  tablet Take 10 mg by mouth daily.   Yes Historical Provider, MD  loratadine (CLARITIN) 10 MG tablet Take 10 mg by mouth every morning.    Yes Historical Provider, MD  meloxicam (MOBIC) 7.5 MG tablet Take 7.5 mg by mouth every morning.   Yes Historical Provider, MD  Oxycodone HCl 20 MG TABS Take 20 mg by mouth 4 (four) times daily as needed (for pain).  11/04/14  Yes Historical Provider, MD  PARoxetine (PAXIL) 40 MG tablet Take 40 mg by mouth every morning.   Yes Historical Provider, MD  potassium chloride (K-DUR) 10 MEQ tablet Take 1 tablet by mouth daily. 02/22/15  Yes Historical Provider, MD  SPIRIVA HANDIHALER 18 MCG inhalation capsule Take 18 mcg by mouth daily.  11/04/14  Yes Historical Provider, MD  predniSONE (STERAPRED UNI-PAK 21 TAB) 10 MG (21) TBPK tablet Take 6 tabs by mouth daily  for 2 days, then 5 tabs for 2 days, then 4 tabs for 2 days, then 3 tabs for 2 days, 2 tabs for 2 days, then 1 tab by mouth daily for 2 days 01/29/16   Dorie Rank, MD   BP 112/62 mmHg  Pulse 99  Temp(Src) 98.2 F (36.8 C) (Oral)  Resp 21  Ht '5\' 5"'$  (1.651 m)  Wt 61.689 kg  BMI 22.63 kg/m2  SpO2 95% Physical Exam  Constitutional: No distress.  HENT:  Head: Normocephalic and atraumatic.  Right Ear: External ear normal.  Left Ear: External ear normal.  Eyes: Conjunctivae are normal. Right eye exhibits no discharge. Left eye exhibits no discharge. No scleral icterus.  Neck: Neck supple. No tracheal deviation present.  Cardiovascular: Normal rate, regular rhythm and intact distal pulses.   Pulmonary/Chest: Effort normal. No stridor. No respiratory distress. She has decreased breath sounds. She has wheezes in the right upper field, the right middle field, the right lower field, the left upper field, the left middle field and the left lower field. She has no rales.  Abdominal: Soft. Bowel sounds are normal. She exhibits no distension. There is no tenderness. There is no rebound and no guarding.  Musculoskeletal:  She exhibits no edema or tenderness.  Neurological: She is alert. She has normal strength. No cranial nerve deficit (no facial droop, extraocular movements intact, no slurred speech) or sensory deficit. She exhibits normal muscle tone. She displays no seizure activity. Coordination normal.  Skin: Skin is warm and dry. No rash noted. She is not diaphoretic.  Psychiatric: She has a  normal mood and affect.  Nursing note and vitals reviewed.   ED Course  Procedures (including critical care time)  Medications  albuterol (PROVENTIL) (2.5 MG/3ML) 0.083% nebulizer solution 5 mg (not administered)  albuterol (PROVENTIL) (2.5 MG/3ML) 0.083% nebulizer solution 5 mg (5 mg Nebulization Given 01/29/16 1515)  albuterol (PROVENTIL) (2.5 MG/3ML) 0.083% nebulizer solution 5 mg (5 mg Nebulization Given 01/29/16 1534)  ipratropium (ATROVENT) nebulizer solution 0.5 mg (0.5 mg Nebulization Given 01/29/16 1534)  methylPREDNISolone sodium succinate (SOLU-MEDROL) 125 mg/2 mL injection 125 mg (125 mg Intravenous Given 01/29/16 1526)    Labs Review Labs Reviewed  BASIC METABOLIC PANEL - Abnormal; Notable for the following:    Chloride 90 (*)    CO2 39 (*)    Glucose, Bld 146 (*)    All other components within normal limits  CBC - Abnormal; Notable for the following:    MCHC 29.9 (*)    All other components within normal limits  TROPONIN I  BRAIN NATRIURETIC PEPTIDE    Imaging Review Dg Chest 2 View  01/29/2016  CLINICAL DATA:  Left chest pain beginning last night. Initial encounter. EXAM: CHEST  2 VIEW COMPARISON:  PA and lateral chest 01/07/2015 and 11/08/2015. FINDINGS: The lungs are emphysematous with bibasilar scarring, worse on the right. No consolidative process, pneumothorax or effusion is identified. Heart size is normal. Bones are osteopenic. Thoracic spondylosis is noted. IMPRESSION: COPD without acute disease. Osteopenia. Electronically Signed   By: Inge Rise M.D.   On: 01/29/2016 15:05   I  have personally reviewed and evaluated these images and lab results as part of my medical decision-making.   EKG Interpretation   Date/Time:  Thursday Jan 29 2016 14:33:25 EDT Ventricular Rate:  100 PR Interval:  154 QRS Duration: 95 QT Interval:  349 QTC Calculation: 450 R Axis:   79 Text Interpretation:  Sinus tachycardia No significant change since last  tracing except sinus has replaced ectopic atrial rhythm Confirmed by Lanora Reveron   MD-J, Merlean Pizzini ([id number]) on 01/29/2016 2:35:54 PM      MDM   Final diagnoses:  COPD exacerbation ([place])    I believe the patient's symptoms are related to a COPD exacerbation. She has significant wheezing on exam and has been coughing. I think the intermittent pain is relating to her pulmonary issues.  Laboratory tests and x-rays are reassuring. EKG does not show any acute ischemia. Patient was treated with albuterol Atrovent nebulizer treatments and steroids. Her symptoms improved. SHe was able to walk around the emergency room and she feels like she is back at her baseline.  Plan on discharge home with a prescription for a prednisone taper    Dorie Rank, MD 01/29/16 1642

## 2016-02-02 ENCOUNTER — Encounter: Payer: Self-pay | Admitting: Nurse Practitioner

## 2016-02-02 ENCOUNTER — Ambulatory Visit: Payer: Medicare HMO | Admitting: Nurse Practitioner

## 2016-02-02 ENCOUNTER — Telehealth: Payer: Self-pay | Admitting: Nurse Practitioner

## 2016-02-02 NOTE — Telephone Encounter (Signed)
PATIENT WAS A NO SHOW AND LETTER SENT  °

## 2016-02-04 NOTE — Telephone Encounter (Signed)
Noted  

## 2016-04-28 ENCOUNTER — Other Ambulatory Visit: Payer: Self-pay | Admitting: Physician Assistant

## 2016-04-28 NOTE — Telephone Encounter (Signed)
Spoke with patient. She has COPD and is coughing more than usual. She is requesting Hycodan. Explained that we can't prescribe that over the phone and that she would need to be seen. She is a patient of [facility] and hasn't established at our practice yet. Offered an appointment but she prefers to wait. Suggested OTC Delsym and and to let us know if the cough worsened or didn't improve. She stated understanding and agreement to plan.

## 2016-04-29 ENCOUNTER — Encounter: Payer: Self-pay | Admitting: Nurse Practitioner

## 2016-04-29 ENCOUNTER — Ambulatory Visit: Payer: Self-pay | Admitting: Nurse Practitioner

## 2016-04-30 ENCOUNTER — Encounter: Payer: Self-pay | Admitting: Physician Assistant

## 2016-06-12 ENCOUNTER — Other Ambulatory Visit: Payer: Self-pay | Admitting: Physician Assistant

## 2016-08-09 ENCOUNTER — Other Ambulatory Visit: Payer: Self-pay | Admitting: *Deleted

## 2016-08-09 NOTE — Telephone Encounter (Signed)
She needs an appointment and to be given pain contract.

## 2016-08-10 NOTE — Telephone Encounter (Signed)
No answer no voicemail jkp 11/28

## 2016-08-10 NOTE — Telephone Encounter (Signed)
Per pt she does not want refills here She sees Dr Melina Copa

## 2016-08-14 IMAGING — DX DG CHEST 2V
2 series · 2 of 2 positions shown · non-contrast
Comparison: 10/20/2015 and prior exams

CLINICAL DATA: 64-year-old female with increasing shortness of
breath and left chest pain.

EXAM:
CHEST  2 VIEW

[chest pa]
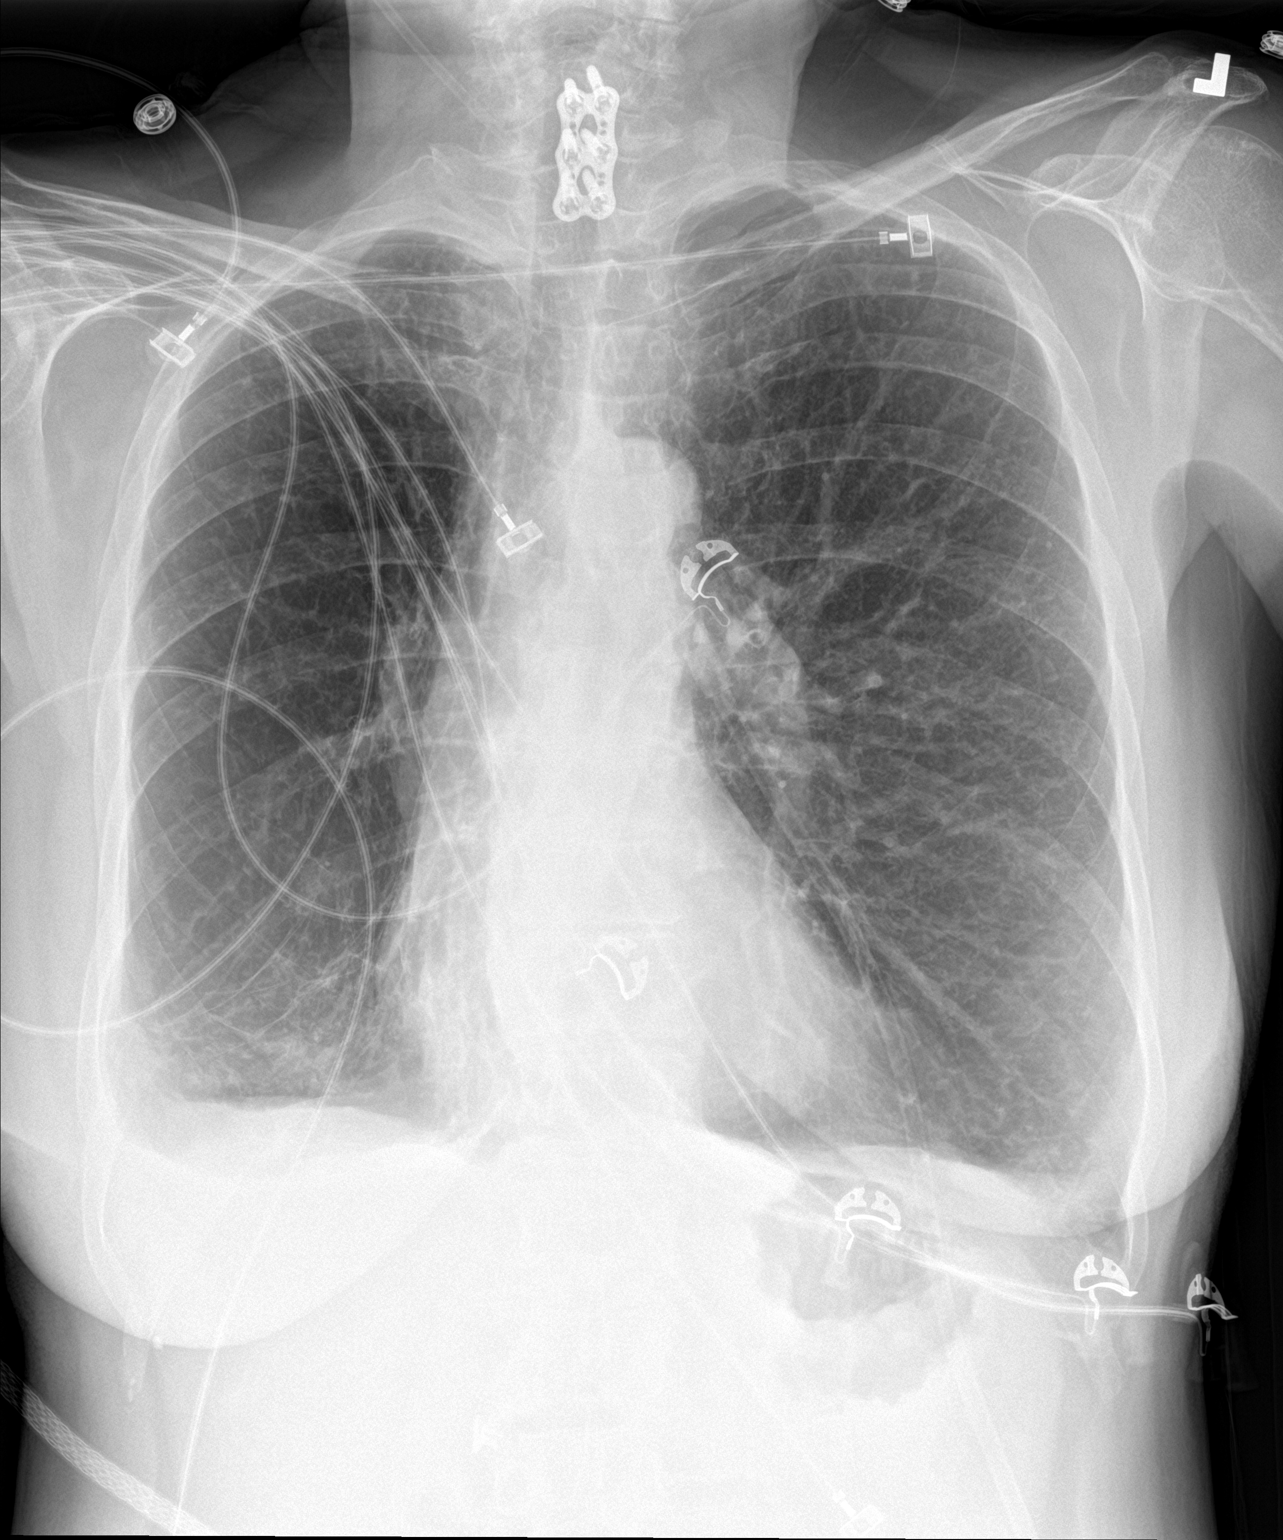

[chest lat]
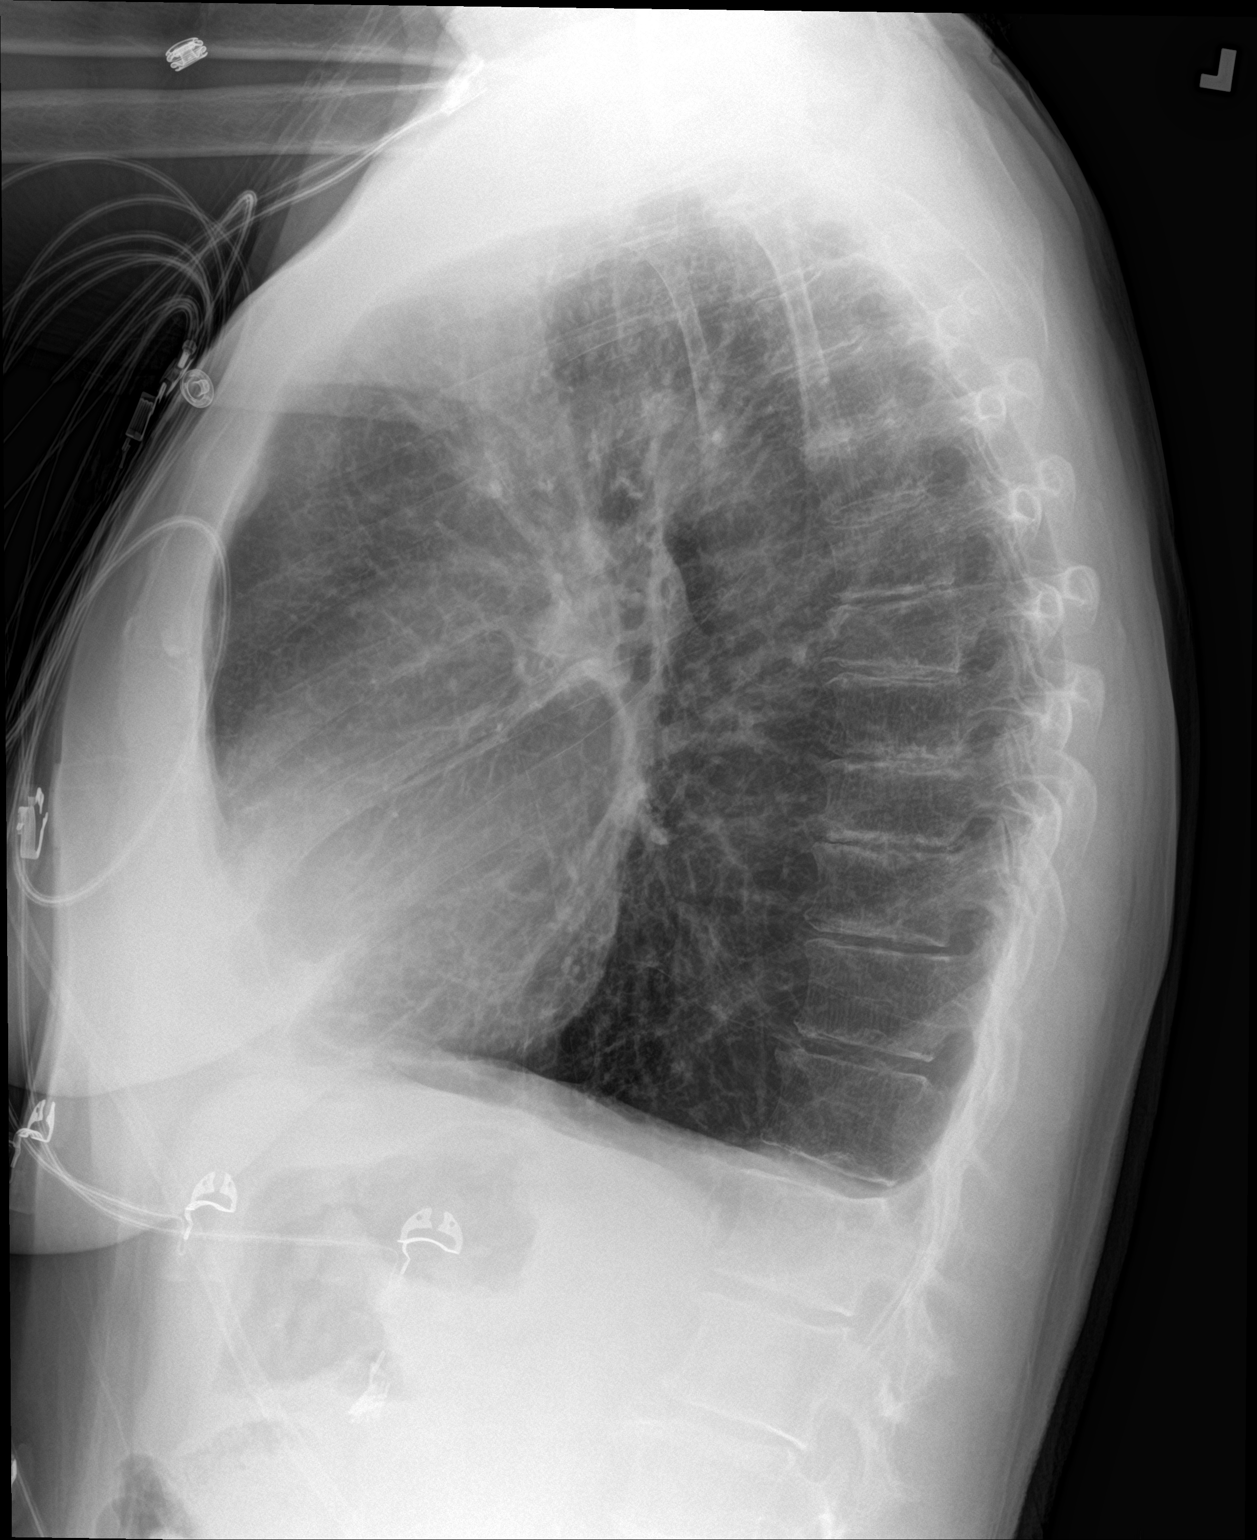

[2 of 2 positions shown; findings below may reference images not displayed]

FINDINGS: The cardiomediastinal silhouette is unremarkable.

COPD/ emphysema again identified.

Chronic blunting of the costophrenic angles again noted.

There is no evidence of airspace disease, pneumothorax, mass, nodule
or edema.

No acute bony abnormalities are present. Lower cervical spine
surgical hardware again noted.
IMPRESSION: No evidence of acute cardiopulmonary disease.

COPD/ emphysema.

## 2016-08-28 ENCOUNTER — Other Ambulatory Visit: Payer: Self-pay | Admitting: Physician Assistant

## 2016-09-05 ENCOUNTER — Emergency Department (HOSPITAL_COMMUNITY): Payer: Medicare HMO

## 2016-09-05 ENCOUNTER — Inpatient Hospital Stay (HOSPITAL_COMMUNITY)
Admission: EM | Admit: 2016-09-05 | Discharge: 2016-09-14 | DRG: 207 | Disposition: A | Discharge status: Home / Self Care | Payer: Medicare HMO | Attending: Internal Medicine | Admitting: Internal Medicine

## 2016-09-05 ENCOUNTER — Encounter (HOSPITAL_COMMUNITY): Payer: Self-pay | Admitting: *Deleted

## 2016-09-05 ENCOUNTER — Inpatient Hospital Stay (HOSPITAL_COMMUNITY): Payer: Medicare HMO

## 2016-09-05 DIAGNOSIS — D649 Anemia, unspecified: Secondary | ICD-10-CM | POA: Diagnosis present

## 2016-09-05 DIAGNOSIS — I2510 Atherosclerotic heart disease of native coronary artery without angina pectoris: Secondary | ICD-10-CM | POA: Diagnosis present

## 2016-09-05 DIAGNOSIS — G4730 Sleep apnea, unspecified: Secondary | ICD-10-CM | POA: Diagnosis present

## 2016-09-05 DIAGNOSIS — Z9981 Dependence on supplemental oxygen: Secondary | ICD-10-CM

## 2016-09-05 DIAGNOSIS — J9610 Chronic respiratory failure, unspecified whether with hypoxia or hypercapnia: Secondary | ICD-10-CM | POA: Diagnosis present

## 2016-09-05 DIAGNOSIS — I10 Essential (primary) hypertension: Secondary | ICD-10-CM | POA: Diagnosis present

## 2016-09-05 DIAGNOSIS — M549 Dorsalgia, unspecified: Secondary | ICD-10-CM | POA: Diagnosis present

## 2016-09-05 DIAGNOSIS — F1320 Sedative, hypnotic or anxiolytic dependence, uncomplicated: Secondary | ICD-10-CM | POA: Diagnosis present

## 2016-09-05 DIAGNOSIS — J9621 Acute and chronic respiratory failure with hypoxia: Secondary | ICD-10-CM | POA: Diagnosis present

## 2016-09-05 DIAGNOSIS — R34 Anuria and oliguria: Secondary | ICD-10-CM | POA: Diagnosis present

## 2016-09-05 DIAGNOSIS — Z23 Encounter for immunization: Secondary | ICD-10-CM

## 2016-09-05 DIAGNOSIS — K219 Gastro-esophageal reflux disease without esophagitis: Secondary | ICD-10-CM | POA: Diagnosis present

## 2016-09-05 DIAGNOSIS — Z9289 Personal history of other medical treatment: Secondary | ICD-10-CM

## 2016-09-05 DIAGNOSIS — E872 Acidosis: Secondary | ICD-10-CM | POA: Diagnosis present

## 2016-09-05 DIAGNOSIS — R0989 Other specified symptoms and signs involving the circulatory and respiratory systems: Secondary | ICD-10-CM

## 2016-09-05 DIAGNOSIS — G35 Multiple sclerosis: Secondary | ICD-10-CM | POA: Diagnosis present

## 2016-09-05 DIAGNOSIS — Z7982 Long term (current) use of aspirin: Secondary | ICD-10-CM

## 2016-09-05 DIAGNOSIS — J9622 Acute and chronic respiratory failure with hypercapnia: Secondary | ICD-10-CM | POA: Diagnosis present

## 2016-09-05 DIAGNOSIS — Z79899 Other long term (current) drug therapy: Secondary | ICD-10-CM

## 2016-09-05 DIAGNOSIS — J9602 Acute respiratory failure with hypercapnia: Secondary | ICD-10-CM | POA: Diagnosis present

## 2016-09-05 DIAGNOSIS — G9340 Encephalopathy, unspecified: Secondary | ICD-10-CM | POA: Diagnosis not present

## 2016-09-05 DIAGNOSIS — J9600 Acute respiratory failure, unspecified whether with hypoxia or hypercapnia: Secondary | ICD-10-CM

## 2016-09-05 DIAGNOSIS — J441 Chronic obstructive pulmonary disease with (acute) exacerbation: Secondary | ICD-10-CM | POA: Diagnosis present

## 2016-09-05 DIAGNOSIS — Z7902 Long term (current) use of antithrombotics/antiplatelets: Secondary | ICD-10-CM | POA: Diagnosis not present

## 2016-09-05 DIAGNOSIS — M542 Cervicalgia: Secondary | ICD-10-CM | POA: Diagnosis present

## 2016-09-05 DIAGNOSIS — E43 Unspecified severe protein-calorie malnutrition: Secondary | ICD-10-CM | POA: Diagnosis present

## 2016-09-05 DIAGNOSIS — Z7189 Other specified counseling: Secondary | ICD-10-CM | POA: Diagnosis not present

## 2016-09-05 DIAGNOSIS — F17200 Nicotine dependence, unspecified, uncomplicated: Secondary | ICD-10-CM | POA: Diagnosis present

## 2016-09-05 DIAGNOSIS — G8929 Other chronic pain: Secondary | ICD-10-CM | POA: Diagnosis present

## 2016-09-05 DIAGNOSIS — E876 Hypokalemia: Secondary | ICD-10-CM | POA: Diagnosis present

## 2016-09-05 DIAGNOSIS — F0390 Unspecified dementia without behavioral disturbance: Secondary | ICD-10-CM | POA: Diagnosis present

## 2016-09-05 DIAGNOSIS — R0689 Other abnormalities of breathing: Secondary | ICD-10-CM | POA: Diagnosis not present

## 2016-09-05 DIAGNOSIS — D696 Thrombocytopenia, unspecified: Secondary | ICD-10-CM | POA: Diagnosis present

## 2016-09-05 DIAGNOSIS — J9690 Respiratory failure, unspecified, unspecified whether with hypoxia or hypercapnia: Secondary | ICD-10-CM

## 2016-09-05 DIAGNOSIS — J9601 Acute respiratory failure with hypoxia: Secondary | ICD-10-CM

## 2016-09-05 DIAGNOSIS — R739 Hyperglycemia, unspecified: Secondary | ICD-10-CM | POA: Diagnosis present

## 2016-09-05 DIAGNOSIS — F17210 Nicotine dependence, cigarettes, uncomplicated: Secondary | ICD-10-CM | POA: Diagnosis present

## 2016-09-05 DIAGNOSIS — R069 Unspecified abnormalities of breathing: Secondary | ICD-10-CM

## 2016-09-05 DIAGNOSIS — F419 Anxiety disorder, unspecified: Secondary | ICD-10-CM | POA: Diagnosis present

## 2016-09-05 DIAGNOSIS — F329 Major depressive disorder, single episode, unspecified: Secondary | ICD-10-CM | POA: Diagnosis present

## 2016-09-05 DIAGNOSIS — F32A Depression, unspecified: Secondary | ICD-10-CM | POA: Diagnosis present

## 2016-09-05 LAB — BLOOD GAS, ARTERIAL
ACID-BASE EXCESS: 13.4 mmol/L — AB (ref 0.0–2.0)
Acid-Base Excess: 15.1 mmol/L — ABNORMAL HIGH (ref 0.0–2.0)
BICARBONATE: 35.8 mmol/L — AB (ref 20.0–28.0)
Bicarbonate: 33.8 mmol/L — ABNORMAL HIGH (ref 20.0–28.0)
DELIVERY SYSTEMS: POSITIVE
DRAWN BY: 317771
DRAWN BY: 317771
Delivery systems: POSITIVE
EXPIRATORY PAP: 6
Expiratory PAP: 6
FIO2: 0.4
FIO2: 0.8
Inspiratory PAP: 12
Inspiratory PAP: 12
LHR: 10 {breaths}/min
O2 CONTENT: 40 L/min
O2 Content: 80 L/min
O2 SAT: 99.3 %
O2 Saturation: 97.4 %
PCO2 ART: 98.4 mmHg — AB (ref 32.0–48.0)
PH ART: 7.243 — AB (ref 7.350–7.450)
RATE: 10 resp/min
pCO2 arterial: 97.9 mmHg (ref 32.0–48.0)
pH, Arterial: 7.26 — ABNORMAL LOW (ref 7.350–7.450)
pO2, Arterial: 110 mmHg — ABNORMAL HIGH (ref 83.0–108.0)
pO2, Arterial: 299 mmHg — ABNORMAL HIGH (ref 83.0–108.0)

## 2016-09-05 LAB — CBC WITH DIFFERENTIAL/PLATELET
Basophils Absolute: 0 10*3/uL (ref 0.0–0.1)
Basophils Relative: 0 %
Eosinophils Absolute: 0 10*3/uL (ref 0.0–0.7)
Eosinophils Relative: 0 %
HCT: 44.2 % (ref 36.0–46.0)
HEMOGLOBIN: 13.2 g/dL (ref 12.0–15.0)
LYMPHS ABS: 0.9 10*3/uL (ref 0.7–4.0)
LYMPHS PCT: 9 %
MCH: 27.2 pg (ref 26.0–34.0)
MCHC: 29.9 g/dL — ABNORMAL LOW (ref 30.0–36.0)
MCV: 90.9 fL (ref 78.0–100.0)
Monocytes Absolute: 0.9 10*3/uL (ref 0.1–1.0)
Monocytes Relative: 9 %
NEUTROS ABS: 8.1 10*3/uL — AB (ref 1.7–7.7)
NEUTROS PCT: 82 %
Platelets: 141 10*3/uL — ABNORMAL LOW (ref 150–400)
RBC: 4.86 MIL/uL (ref 3.87–5.11)
RDW: 13.7 % (ref 11.5–15.5)
WBC: 9.9 10*3/uL (ref 4.0–10.5)

## 2016-09-05 LAB — COMPREHENSIVE METABOLIC PANEL
ALT: 15 U/L (ref 14–54)
ANION GAP: 10 (ref 5–15)
AST: 25 U/L (ref 15–41)
Albumin: 3.6 g/dL (ref 3.5–5.0)
Alkaline Phosphatase: 94 U/L (ref 38–126)
BUN: 11 mg/dL (ref 6–20)
CHLORIDE: 86 mmol/L — AB (ref 101–111)
CO2: 39 mmol/L — AB (ref 22–32)
Calcium: 10.1 mg/dL (ref 8.9–10.3)
Creatinine, Ser: 0.67 mg/dL (ref 0.44–1.00)
GFR calc non Af Amer: >60 mL/min (ref >60)
Glucose, Bld: 133 mg/dL — ABNORMAL HIGH (ref 65–99)
Potassium: 3.3 mmol/L — ABNORMAL LOW (ref 3.5–5.1)
SODIUM: 135 mmol/L (ref 135–145)
Total Bilirubin: 0.4 mg/dL (ref 0.3–1.2)
Total Protein: 7.4 g/dL (ref 6.5–8.1)

## 2016-09-05 LAB — POCT I-STAT 3, ART BLOOD GAS (G3+)
ACID-BASE EXCESS: 9 mmol/L — AB (ref 0.0–2.0)
Acid-Base Excess: 13 mmol/L — ABNORMAL HIGH (ref 0.0–2.0)
Acid-Base Excess: 12 mmol/L — ABNORMAL HIGH (ref 0.0–2.0)
BICARBONATE: 38.2 mmol/L — AB (ref 20.0–28.0)
BICARBONATE: 37.4 mmol/L — AB (ref 20.0–28.0)
Bicarbonate: 36.4 mmol/L — ABNORMAL HIGH (ref 20.0–28.0)
O2 SAT: 86 %
O2 Saturation: 99 %
O2 Saturation: 99 %
PCO2 ART: 48.5 mmHg — AB (ref 32.0–48.0)
PCO2 ART: 43.5 mmHg (ref 32.0–48.0)
PO2 ART: 110 mmHg — AB (ref 83.0–108.0)
PO2 ART: 127 mmHg — AB (ref 83.0–108.0)
Patient temperature: 97.8
Patient temperature: 97.8
TCO2: 40 mmol/L (ref 0–100)
TCO2: 38 mmol/L (ref 0–100)
TCO2: 39 mmol/L (ref 0–100)
pCO2 arterial: 67.2 mmHg (ref 32.0–48.0)
pH, Arterial: 7.502 — ABNORMAL HIGH (ref 7.350–7.450)
pH, Arterial: 7.529 — ABNORMAL HIGH (ref 7.350–7.450)
pH, Arterial: 7.352 (ref 7.350–7.450)
pO2, Arterial: 55 mmHg — ABNORMAL LOW (ref 83.0–108.0)

## 2016-09-05 LAB — BRAIN NATRIURETIC PEPTIDE: B Natriuretic Peptide: 36 pg/mL (ref 0.0–100.0)

## 2016-09-05 LAB — URINALYSIS, ROUTINE W REFLEX MICROSCOPIC
BILIRUBIN URINE: NEGATIVE
Bacteria, UA: NONE SEEN
GLUCOSE, UA: NEGATIVE mg/dL
HGB URINE DIPSTICK: NEGATIVE
Ketones, ur: NEGATIVE mg/dL
LEUKOCYTES UA: NEGATIVE
NITRITE: NEGATIVE
PROTEIN: 100 mg/dL — AB
Specific Gravity, Urine: 1.023 (ref 1.005–1.030)
pH: 5 (ref 5.0–8.0)

## 2016-09-05 LAB — TRIGLYCERIDES: TRIGLYCERIDES: 50 mg/dL (ref <150)

## 2016-09-05 LAB — INFLUENZA PANEL BY PCR (TYPE A & B)
Influenza A By PCR: NEGATIVE
Influenza B By PCR: NEGATIVE

## 2016-09-05 LAB — MRSA PCR SCREENING: MRSA BY PCR: NEGATIVE

## 2016-09-05 LAB — TROPONIN I: Troponin I: <0.03 ng/mL (ref <0.03)

## 2016-09-05 MED ORDER — CHLORHEXIDINE GLUCONATE 0.12% ORAL RINSE (MEDLINE KIT)
15.0000 mL | Freq: Two times a day (BID) | OROMUCOSAL | Status: DC
  Administered 2016-09-05 – 2016-09-13 (×17): 15 mL via OROMUCOSAL

## 2016-09-05 MED ORDER — ENOXAPARIN SODIUM 40 MG/0.4ML ~~LOC~~ SOLN
40.0000 mg | Freq: Every day | SUBCUTANEOUS | Status: DC
  Administered 2016-09-05 – 2016-09-14 (×10): 40 mg via SUBCUTANEOUS
  Filled 2016-09-05 (×10): qty 0.4

## 2016-09-05 MED ORDER — SODIUM CHLORIDE 0.9 % IV SOLN
Freq: Once | INTRAVENOUS | Status: AC
  Administered 2016-09-05: 04:00:00 via INTRAVENOUS

## 2016-09-05 MED ORDER — ALBUTEROL (5 MG/ML) CONTINUOUS INHALATION SOLN
10.0000 mg/h | INHALATION_SOLUTION | Freq: Once | RESPIRATORY_TRACT | Status: AC
  Administered 2016-09-05: 10 mg/h via RESPIRATORY_TRACT
  Filled 2016-09-05: qty 20

## 2016-09-05 MED ORDER — CLOPIDOGREL BISULFATE 75 MG PO TABS
75.0000 mg | ORAL_TABLET | Freq: Every day | ORAL | Status: DC
  Administered 2016-09-05 – 2016-09-14 (×10): 75 mg via ORAL
  Filled 2016-09-05 (×10): qty 1

## 2016-09-05 MED ORDER — PROPOFOL 10 MG/ML IV BOLUS
INTRAVENOUS | Status: AC
  Filled 2016-09-05: qty 20

## 2016-09-05 MED ORDER — FENTANYL CITRATE (PF) 100 MCG/2ML IJ SOLN
50.0000 ug | INTRAMUSCULAR | Status: DC | PRN
  Administered 2016-09-05 – 2016-09-10 (×10): 50 ug via INTRAVENOUS
  Filled 2016-09-05 (×9): qty 2

## 2016-09-05 MED ORDER — SODIUM CHLORIDE 0.9 % IV BOLUS (SEPSIS)
250.0000 mL | Freq: Once | INTRAVENOUS | Status: AC
  Administered 2016-09-05: 250 mL via INTRAVENOUS

## 2016-09-05 MED ORDER — MIDAZOLAM 50MG/50ML (1MG/ML) PREMIX INFUSION
INTRAVENOUS | Status: AC
  Filled 2016-09-05: qty 50

## 2016-09-05 MED ORDER — SODIUM CHLORIDE 0.9 % IV SOLN
INTRAVENOUS | Status: DC
  Administered 2016-09-05 – 2016-09-06 (×2): 100 mL/h via INTRAVENOUS
  Administered 2016-09-06 – 2016-09-08 (×3): via INTRAVENOUS

## 2016-09-05 MED ORDER — DOCUSATE SODIUM 50 MG/5ML PO LIQD
100.0000 mg | Freq: Every day | ORAL | Status: DC
Start: 2016-09-05 — End: 2016-09-05

## 2016-09-05 MED ORDER — METHYLPREDNISOLONE SODIUM SUCC 125 MG IJ SOLR
40.0000 mg | INTRAMUSCULAR | Status: DC
  Administered 2016-09-05: 40 mg via INTRAVENOUS
  Filled 2016-09-05: qty 2

## 2016-09-05 MED ORDER — ORAL CARE MOUTH RINSE
15.0000 mL | Freq: Four times a day (QID) | OROMUCOSAL | Status: DC
  Administered 2016-09-05 – 2016-09-10 (×22): 15 mL via OROMUCOSAL

## 2016-09-05 MED ORDER — FENTANYL CITRATE (PF) 100 MCG/2ML IJ SOLN
50.0000 ug | INTRAMUSCULAR | Status: DC | PRN
  Filled 2016-09-05: qty 2

## 2016-09-05 MED ORDER — ALBUTEROL SULFATE (2.5 MG/3ML) 0.083% IN NEBU
2.5000 mg | INHALATION_SOLUTION | RESPIRATORY_TRACT | Status: DC
  Administered 2016-09-05 (×3): 2.5 mg via RESPIRATORY_TRACT
  Filled 2016-09-05 (×3): qty 3

## 2016-09-05 MED ORDER — MIDAZOLAM HCL 2 MG/2ML IJ SOLN
1.0000 mg | INTRAMUSCULAR | Status: DC | PRN
  Administered 2016-09-05 – 2016-09-08 (×4): 1 mg via INTRAVENOUS
  Filled 2016-09-05 (×4): qty 2

## 2016-09-05 MED ORDER — PROPOFOL 1000 MG/100ML IV EMUL
INTRAVENOUS | Status: AC
  Filled 2016-09-05: qty 100

## 2016-09-05 MED ORDER — DONEPEZIL HCL 10 MG PO TABS
10.0000 mg | ORAL_TABLET | Freq: Every day | ORAL | Status: DC
  Administered 2016-09-05 – 2016-09-13 (×8): 10 mg via ORAL
  Filled 2016-09-05 (×8): qty 1

## 2016-09-05 MED ORDER — PANTOPRAZOLE SODIUM 40 MG PO TBEC: 40.0000 mg | DELAYED_RELEASE_TABLET | Freq: Every day | ORAL | Status: DC

## 2016-09-05 MED ORDER — IPRATROPIUM BROMIDE 0.02 % IN SOLN
0.5000 mg | Freq: Once | RESPIRATORY_TRACT | Status: AC
  Administered 2016-09-05: 0.5 mg via RESPIRATORY_TRACT
  Filled 2016-09-05: qty 2.5

## 2016-09-05 MED ORDER — PAROXETINE HCL 20 MG PO TABS
40.0000 mg | ORAL_TABLET | Freq: Every day | ORAL | Status: DC
  Administered 2016-09-05 – 2016-09-14 (×9): 40 mg via ORAL
  Filled 2016-09-05 (×10): qty 2

## 2016-09-05 MED ORDER — METHYLPREDNISOLONE SODIUM SUCC 125 MG IJ SOLR
125.0000 mg | Freq: Once | INTRAMUSCULAR | Status: AC
  Administered 2016-09-05: 125 mg via INTRAVENOUS
  Filled 2016-09-05: qty 2

## 2016-09-05 MED ORDER — ETOMIDATE 2 MG/ML IV SOLN
INTRAVENOUS | Status: AC | PRN
  Administered 2016-09-05: 15 mg via INTRAVENOUS

## 2016-09-05 MED ORDER — ALPRAZOLAM 0.5 MG PO TABS
1.0000 mg | ORAL_TABLET | Freq: Four times a day (QID) | ORAL | Status: DC | PRN
  Administered 2016-09-05 – 2016-09-11 (×16): 1 mg via ORAL
  Filled 2016-09-05 (×16): qty 2

## 2016-09-05 MED ORDER — IPRATROPIUM-ALBUTEROL 0.5-2.5 (3) MG/3ML IN SOLN
3.0000 mL | RESPIRATORY_TRACT | Status: DC
  Administered 2016-09-05 – 2016-09-11 (×35): 3 mL via RESPIRATORY_TRACT
  Filled 2016-09-05 (×35): qty 3

## 2016-09-05 MED ORDER — ALBUTEROL SULFATE (2.5 MG/3ML) 0.083% IN NEBU: 2.5000 mg | INHALATION_SOLUTION | RESPIRATORY_TRACT | Status: DC | PRN

## 2016-09-05 MED ORDER — SUCCINYLCHOLINE CHLORIDE 20 MG/ML IJ SOLN
INTRAMUSCULAR | Status: AC | PRN
  Administered 2016-09-05: 100 mg via INTRAVENOUS

## 2016-09-05 MED ORDER — LEVOFLOXACIN 25 MG/ML PO SOLN
500.0000 mg | ORAL | Status: DC
  Administered 2016-09-05 – 2016-09-10 (×6): 500 mg via ORAL
  Filled 2016-09-05 (×6): qty 20

## 2016-09-05 MED ORDER — PANTOPRAZOLE SODIUM 40 MG PO PACK
40.0000 mg | PACK | Freq: Every day | ORAL | Status: DC
  Administered 2016-09-05 – 2016-09-10 (×6): 40 mg
  Filled 2016-09-05 (×7): qty 20

## 2016-09-05 MED ORDER — PNEUMOCOCCAL VAC POLYVALENT 25 MCG/0.5ML IJ INJ
0.5000 mL | INJECTION | INTRAMUSCULAR | Status: AC
  Administered 2016-09-06: 0.5 mL via INTRAMUSCULAR
  Filled 2016-09-05 (×2): qty 0.5

## 2016-09-05 MED ORDER — PROPOFOL 1000 MG/100ML IV EMUL
0.0000 ug/kg/min | INTRAVENOUS | Status: DC
  Administered 2016-09-05: 20 ug/kg/min via INTRAVENOUS
  Administered 2016-09-05: 30 ug/kg/min via INTRAVENOUS
  Administered 2016-09-06: 35 ug/kg/min via INTRAVENOUS
  Administered 2016-09-06: 15 ug/kg/min via INTRAVENOUS
  Administered 2016-09-06: 30 ug/kg/min via INTRAVENOUS
  Administered 2016-09-07: 20 ug/kg/min via INTRAVENOUS
  Administered 2016-09-07: 40 ug/kg/min via INTRAVENOUS
  Administered 2016-09-08: 20 ug/kg/min via INTRAVENOUS
  Administered 2016-09-08: 40 ug/kg/min via INTRAVENOUS
  Administered 2016-09-09: 35 ug/kg/min via INTRAVENOUS
  Administered 2016-09-09: 20 ug/kg/min via INTRAVENOUS
  Administered 2016-09-10: 10 ug/kg/min via INTRAVENOUS
  Filled 2016-09-05 (×12): qty 100

## 2016-09-05 MED ORDER — METHYLPREDNISOLONE SODIUM SUCC 125 MG IJ SOLR
40.0000 mg | Freq: Three times a day (TID) | INTRAMUSCULAR | Status: DC
  Administered 2016-09-06 – 2016-09-11 (×17): 40 mg via INTRAVENOUS
  Filled 2016-09-05 (×17): qty 2

## 2016-09-05 MED ORDER — SODIUM CHLORIDE 0.9 % IV SOLN
1.0000 mg/h | INTRAVENOUS | Status: DC
  Administered 2016-09-05: 1 mg/h via INTRAVENOUS
  Filled 2016-09-05: qty 10

## 2016-09-05 MED ORDER — IPRATROPIUM BROMIDE 0.02 % IN SOLN
0.5000 mg | Freq: Four times a day (QID) | RESPIRATORY_TRACT | Status: DC
  Administered 2016-09-05: 0.5 mg via RESPIRATORY_TRACT
  Filled 2016-09-05: qty 2.5

## 2016-09-05 NOTE — ED Notes (Signed)
Respiratory care in to reassess pt

## 2016-09-05 NOTE — ED Notes (Signed)
Pt arrived orthopneic and tachypnic- She was diaphoretic. Iv by EMS with 2nd established by this RN.   Pt put on Bipap by respiratory

## 2016-09-05 NOTE — ED Notes (Signed)
CRITICAL VALUE ALERT  Critical value received:  CO2 97.9  Date of notification:  09/05/2016 Time of notification:  02:14  Critical value read back: yes Nurse who received alert:  Luis Abed   MD notified (1st page): Dr Tomi Bamberger  Time of first page:  02:14  MD notified (2nd page):  Time of second page:  Responding MD:  Dr Tomi Bamberger  Time MD responded:  02:14

## 2016-09-05 NOTE — ED Provider Notes (Signed)
[facility]-EM[facility]Y DEPT Provider Note    By signing my name below, I, Bea Graff, attest that this documentation has been prepared under the direction and in the presence of Rolland Porter, MD. Electronically Signed: Bea Graff, ED Scribe. 09/05/16. 1:11 AM.  Time seen 12:16 AM (pt seen on arrival)  History   Chief Complaint Chief Complaint  Patient presents with  . Shortness of Breath   LEVEL 5 CAVEAT- Full history could not be obtained due to respiratory distress.  The history is provided by the EMS personnel and medical records. No language interpreter was used.    HPI Comments:  Olivia Randall is a 65 y.o. female with PMHx of COPD, asthma, dementia and MS brought in by EMS, who presents to the Emergency Department complaining of respiratory distress that began earlier today. EMS reports she called them earlier today but denied transport at that time. She called them again tonight because her SOB has worsened. She reports associated abdominal pain. EMS report she was given albuterol 2.5 mg nebulizer with no relief of her symptoms. Pt is on home oxygen at 3 L/min [place] and EMS states her pulse ox was 74% on her oxygen on arrival to her home. There are no modifying factors noted.   PCP is Dr. Melina Copa.   Past Medical History:  Diagnosis Date  . Anxiety and depression   . Asthma   . Chronic back pain   . Chronic neck pain   . COPD (chronic obstructive pulmonary disease) ([place])   . 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 Depression   . GERD (gastroesophageal reflux disease)   . Hypertension   . MS (multiple sclerosis) ([place])   . MS (multiple sclerosis) ([place])    staes xrays showed signs of ms  . On home O2    2L N/C  . Shortness of breath dyspnea   . Sleep apnea     Patient Active Problem List   Diagnosis Date Noted  . Respiratory failure with hypercapnia ([place]) 09/05/2016  . HTN (hypertension) 11/08/2015  . Mucosal abnormality of stomach   . Acute on chronic  respiratory failure ([place]) 03/18/2015  . Hyponatremia 03/18/2015  . Sleep apnea   . Anxiety and depression   . COPD exacerbation ([place]) 03/17/2015  . Dysphagia, pharyngoesophageal phase 02/18/2015  . OSA (obstructive sleep apnea) 05/20/2013  . Tobacco use disorder 05/20/2013  . Dizzy 12/04/2012  . COPD with acute exacerbation ([place]) 12/03/2012  . Chronic respiratory failure ([place]) 12/03/2012  . Hypokalemia 12/03/2012  . Chronic back pain 12/03/2012  . GERD (gastroesophageal reflux disease) 12/03/2012    Past Surgical History:  Procedure Laterality Date  . ABDOMINAL HYSTERECTOMY    . BIOPSY  04/24/2015   Procedure: BIOPSY (Gastric);  Surgeon: [name], MD;  Location: [facility] ORS;  Service: Endoscopy;;  . BLADDER SURGERY    . BREAST SURGERY    . CARPAL TUNNEL RELEASE    . CHOLECYSTECTOMY    . ESOPHAGOGASTRODUODENOSCOPY  2009   Dr. Laural Golden: small sliding hiatal hernia with mild reflux esophagitis, empiric dilation with 54/56 F, nonerosive antral gastritis   . ESOPHAGOGASTRODUODENOSCOPY (EGD) WITH PROPOFOL N/A 04/24/2015   RMR: normal esophagus status post Maloney dilation. 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   [name] HEMORRHOID SURGERY    . MALONEY DILATION N/A 04/24/2015   Procedure: MALONEY ESOPHAGEAL DILATION (54FR);  Surgeon: [name], MD;  Location: [facility] OR[facility];  Service: Endoscopy;  Laterality: N/A;  .  NECK SURGERY    . SHOULDER SURGERY Right     OB History    Gravida Para Term Preterm AB Living   '1 1 1         '$ SAB TAB Ectopic Multiple Live Births                   Home Medications    Prior to Admission medications   Medication Sig Start Date End Date Taking? Authorizing Provider  ADVAIR HFA 230-21 MCG/ACT inhaler Inhale 1 puff into the lungs 2 (two) times daily. 01/24/16   Historical Provider, MD  albuterol (PROAIR HFA) 108 (90 BASE) MCG/ACT inhaler Inhale 2 puffs into the lungs every 6 (six) hours as needed for shortness of breath.      Historical Provider, MD  albuterol (PROVENTIL) (2.5 MG/3ML) 0.083% nebulizer solution Take 2.5 mg by nebulization every 6 (six) hours as needed for shortness of breath (Mixed with Ipratropium).     Historical Provider, MD  ALPRAZolam Duanne Moron) 1 MG tablet Take 1 mg by mouth 4 (four) times daily as needed for anxiety.     Historical Provider, MD  Aspirin-Acetaminophen-Caffeine (GOODY HEADACHE PO) Take 1 Package by mouth daily as needed (Pain).     Historical Provider, MD  clopidogrel (PLAVIX) 75 MG tablet Take 75 mg by mouth daily.  01/25/15   Historical Provider, MD  donepezil (ARICEPT) 10 MG tablet Take 10 mg by mouth at bedtime.  11/04/14   Historical Provider, MD  esomeprazole (NEXIUM) 40 MG capsule Take 40 mg by mouth daily. 11/04/14   Historical Provider, MD  furosemide (LASIX) 20 MG tablet Take 20 mg by mouth every morning.     Historical Provider, MD  lisinopril (PRINIVIL,ZESTRIL) 10 MG tablet TAKE ONE (1) TABLET EACH DAY 06/14/16   Terald Sleeper, PA-C  loratadine (CLARITIN) 10 MG tablet Take 10 mg by mouth every morning.     Historical Provider, MD  meloxicam (MOBIC) 7.5 MG tablet Take 7.5 mg by mouth every morning.    Historical Provider, MD  Oxycodone HCl 20 MG TABS Take 20 mg by mouth 4 (four) times daily as needed (for pain).  11/04/14   Historical Provider, MD  PARoxetine (PAXIL) 40 MG tablet Take 40 mg by mouth every morning.    Historical Provider, MD  potassium chloride (K-DUR) 10 MEQ tablet Take 1 tablet by mouth daily. 02/22/15   Historical Provider, MD  predniSONE (STERAPRED UNI-PAK 21 TAB) 10 MG (21) TBPK tablet Take 6 tabs by mouth daily  for 2 days, then 5 tabs for 2 days, then 4 tabs for 2 days, then 3 tabs for 2 days, 2 tabs for 2 days, then 1 tab by mouth daily for 2 days 01/29/16   Dorie Rank, MD  [facility] HANDIHALER 18 MCG inhalation capsule Take 18 mcg by mouth daily.  11/04/14   Historical Provider, MD    Family History Family History  Problem Relation Age of Onset  . Diabetes  Mother   . Diabetes Father   . Diabetes Brother   . Colon cancer Neg Hx     Social History Social History  Substance Use Topics  . Smoking status: Current Every Day Smoker    Types: Cigarettes  . Smokeless tobacco: Not on file     Comment: 3-4 cigarettes a day  . Alcohol use No  lives at home  Lives with spouse Smokes 1/2 ppd   Allergies   Patient has no known allergies.   Review of  Systems Review of Systems  Unable to perform ROS: Severe respiratory distress   LEVEL 5 CAVEAT- Full history could not be obtained due to respiratory distress.   Physical Exam Updated Vital Signs BP 132/58 (BP Location: Left Arm)   Pulse 108   Resp 20   Ht '5\' 4"'$  (1.626 m)   Wt 120 lb (54.4 kg)   SpO2 100%   BMI 20.60 kg/m   Vital signs normal except for tachycardia   Physical Exam  Constitutional: She appears lethargic.  Non-toxic appearance. She has a sickly appearance. She appears ill. She appears distressed. Face mask in place.  Thin female appears older than stated age.  HENT:  Head: Normocephalic and atraumatic.  Right Ear: External ear normal.  Left Ear: External ear normal.  Nose: Nose normal. No mucosal edema or rhinorrhea.  Mouth/Throat: Oropharynx is clear and moist and mucous membranes are normal. No dental abscesses or uvula swelling.  edentulous  Eyes: Conjunctivae and EOM are normal. Pupils are equal, round, and reactive to light.  Neck: Normal range of motion and full passive range of motion without pain. Neck supple.  Cardiovascular: Normal rate, regular rhythm and normal heart sounds.  Exam reveals no gallop and no friction rub.   No murmur heard. Pulmonary/Chest: Accessory muscle usage present. Tachypnea noted. She is in respiratory distress. She has decreased breath sounds. She has wheezes. She has no rhonchi. She has no rales. She exhibits no tenderness and no crepitus.  Abdominal: Soft. Normal appearance and bowel sounds are normal. She exhibits no  distension. There is no tenderness. There is no rebound and no guarding.  Musculoskeletal: Normal range of motion. She exhibits no edema or tenderness.  Moves all extremities well.   Neurological: She has normal strength. She appears lethargic. No cranial nerve deficit.  Skin: Skin is warm and intact. No rash noted. She is diaphoretic. No erythema. No pallor.  Psychiatric: Her mood appears not anxious. Her speech is delayed. She is slowed.  Nursing note and vitals reviewed.    ED Treatments / Results   DIAGNOSTIC STUDIES: Oxygen Saturation is 100% on 15 L, facemask, normal by my interpretation.       Medications  midazolam (VERSED) 50 mg in sodium chloride 0.9 % 50 mL (1 mg/mL) infusion (3 mg/hr Intravenous Rate/Dose Change 09/05/16 0323)  albuterol (PROVENTIL,VENTOLIN) solution continuous neb (10 mg/hr Nebulization Given 09/05/16 0027)  ipratropium (ATROVENT) nebulizer solution 0.5 mg (0.5 mg Nebulization Given 09/05/16 0027)  methylPREDNISolone sodium succinate (SOLU-MEDROL) 125 mg/2 mL injection 125 mg (125 mg Intravenous Given 09/05/16 0026)  etomidate (AMIDATE) injection (15 mg Intravenous Given 09/05/16 0230)  succinylcholine (ANECTINE) injection (100 mg Intravenous Given 09/05/16 0231)   Labs (all labs ordered are listed, but only abnormal results are displayed) Results for orders placed or performed during the hospital encounter of 09/05/16  Comprehensive metabolic panel  Result Value Ref Range   Sodium 135 135 - 145 mmol/L   Potassium 3.3 (L) 3.5 - 5.1 mmol/L   Chloride 86 (L) 101 - 111 mmol/L   CO2 39 (H) 22 - 32 mmol/L   Glucose, Bld 133 (H) 65 - 99 mg/dL   BUN 11 6 - 20 mg/dL   Creatinine, Ser 0.67 0.44 - 1.00 mg/dL   Calcium 10.1 8.9 - 10.3 mg/dL   Total Protein 7.4 6.5 - 8.1 g/dL   Albumin 3.6 3.5 - 5.0 g/dL   AST 25 15 - 41 U/L   ALT 15 14 - 54 U/L  Alkaline Phosphatase 94 38 - 126 U/L   Total Bilirubin 0.4 0.3 - 1.2 mg/dL   GFR calc non Af Amer >60 >60  mL/min   GFR calc Af Amer >60 >60 mL/min   Anion gap 10 5 - 15  Brain natriuretic peptide  Result Value Ref Range   B Natriuretic Peptide 36.0 0.0 - 100.0 pg/mL  CBC with Differential  Result Value Ref Range   WBC 9.9 4.0 - 10.5 K/uL   RBC 4.86 3.87 - 5.11 MIL/uL   Hemoglobin 13.2 12.0 - 15.0 g/dL   HCT 44.2 36.0 - 46.0 %   MCV 90.9 78.0 - 100.0 fL   MCH 27.2 26.0 - 34.0 pg   MCHC 29.9 (L) 30.0 - 36.0 g/dL   RDW 13.7 11.5 - 15.5 %   Platelets 141 (L) 150 - 400 K/uL   Neutrophils Relative % 82 %   Neutro Abs 8.1 (H) 1.7 - 7.7 K/uL   Lymphocytes Relative 9 %   Lymphs Abs 0.9 0.7 - 4.0 K/uL   Monocytes Relative 9 %   Monocytes Absolute 0.9 0.1 - 1.0 K/uL   Eosinophils Relative 0 %   Eosinophils Absolute 0.0 0.0 - 0.7 K/uL   Basophils Relative 0 %   Basophils Absolute 0.0 0.0 - 0.1 K/uL  Troponin I  Result Value Ref Range   Troponin I <0.03 <0.03 ng/mL  Blood gas, arterial  Result Value Ref Range   FIO2 0.80    O2 Content 80.0 L/min   Delivery systems BILEVEL POSITIVE AIRWAY PRESSURE    LHR 10.0 resp/min   Inspiratory PAP 12.0    Expiratory PAP 6.0    pH, Arterial 7.243 (L) 7.350 - 7.450   pCO2 arterial 98.4 (HH) 32.0 - 48.0 mmHg   pO2, Arterial 299 (H) 83.0 - 108.0 mmHg   Bicarbonate 33.8 (H) 20.0 - 28.0 mmol/L   Acid-Base Excess 13.4 (H) 0.0 - 2.0 mmol/L   O2 Saturation 99.3 %   Collection site RIGHT RADIAL    Drawn by [id number]    Sample type ARTERIAL DRAW    Allens test (pass/fail) PASS PASS  Blood gas, arterial  Result Value Ref Range   FIO2 0.40    O2 Content 40.0 L/min   Delivery systems BILEVEL POSITIVE AIRWAY PRESSURE    LHR 10.0 resp/min   Inspiratory PAP 12.0    Expiratory PAP 6.0    pH, Arterial 7.260 (L) 7.350 - 7.450   pCO2 arterial 97.9 (HH) 32.0 - 48.0 mmHg   pO2, Arterial 110.00 (H) 83.0 - 108.0 mmHg   Bicarbonate 35.8 (H) 20.0 - 28.0 mmol/L   Acid-Base Excess 15.1 (H) 0.0 - 2.0 mmol/L   O2 Saturation 97.4 %   Collection site RIGHT RADIAL     Drawn by [id number]    Sample type ARTERIAL DRAW    Allens test (pass/fail) PASS PASS  Urinalysis, Routine w reflex microscopic  Result Value Ref Range   Color, Urine YELLOW YELLOW   APPearance HAZY (A) CLEAR   Specific Gravity, Urine 1.023 1.005 - 1.030   pH 5.0 5.0 - 8.0   Glucose, UA NEGATIVE NEGATIVE mg/dL   Hgb urine dipstick NEGATIVE NEGATIVE   Bilirubin Urine NEGATIVE NEGATIVE   Ketones, ur NEGATIVE NEGATIVE mg/dL   Protein, ur 100 (A) NEGATIVE mg/dL   Nitrite NEGATIVE NEGATIVE   Leukocytes, UA NEGATIVE NEGATIVE   RBC / HPF 6-30 0 - 5 RBC/hpf   WBC, UA 0-5 0 - 5 WBC/hpf   Bacteria,  UA NONE SEEN NONE SEEN   Hyaline Casts, UA PRESENT    Laboratory interpretation all normal except Acute on chronic respiratory acidosis on ABG, mild hypokalemia, low chloride, elevated CO2 consistent with chronic metabolic alkalosis    EKG  EKG Interpretation  Date/Time:  Sunday September 05 2016 00:27:59 EST Ventricular Rate:  118 PR Interval:    QRS Duration: 97 QT Interval:  322 QTC Calculation: 452 R Axis:   77 Text Interpretation:  Sinus tachycardia Right atrial enlargement Since last tracing rate faster 29 Jan 2016 Confirmed by Analei Whinery  MD-I, Nikaela Coyne ([id number]) on 09/05/2016 12:40:28 AM       Radiology Dg Chest 1 View  Result Date: 09/05/2016 CLINICAL DATA:  65 year old female status post intubation. EXAM: CHEST 1 VIEW COMPARISON:  Chest radiograph dated 09/05/2016 FINDINGS: There is interval placement of an endotracheal tube with tip approximately 13 mm above the carina. Recommend retraction by 3 cm for optimal positioning. An enteric tube courses into the left hemiabdomen with tip beyond the inferior margin of the image. There is emphysematous changes of the lungs with hyperinflation and chronic interstitial coarsening. Chronic right lung bases scarring noted. No focal consolidation, pleural effusion, or pneumothorax. The cardiac silhouette is within normal limits. Lower cervical fixation plate  and screws partially visualized. No acute osseous pathology. IMPRESSION: Endotracheal tube the tip approximately 13 mm above the carina. Recommend retraction by 3 cm for optimal positioning. Enteric tube extends into the left hemiabdomen with tip beyond the inferior margin of the image. Emphysema.  No acute cardiopulmonary process. Electronically Signed   By: Anner Crete M.D.   On: 09/05/2016 03:17   Dg Chest Port 1 View  Result Date: 09/05/2016 CLINICAL DATA:  Respiratory distress, onset earlier today. Worsened tonight. EXAM: PORTABLE CHEST 1 VIEW COMPARISON:  1127 FINDINGS: Chronic blunting of the right lateral costophrenic angle. The extreme left lateral costophrenic angle is excluded from the image. The visible lungs are clear. Pulmonary vasculature is slightly prominent. No airspace consolidation. Unremarkable hilar and mediastinal contours. IMPRESSION: Slight prominence of the pulmonary vasculature. No interstitial or alveolar edema. Electronically Signed   By: Andreas Newport M.D.   On: 09/05/2016 00:35   02:30 AM Procedures Procedure Name: Intubation Date/Time: 09/05/2016 2:35 AM Performed by: Tomi Bamberger, Davan Nawabi Pre-anesthesia Checklist: Patient identified Oxygen Delivery Method: Non-rebreather mask Preoxygenation: Pre-oxygenation with 100% oxygen Intubation Type: Rapid sequence Ventilation: Mask ventilation without difficulty Laryngoscope Size: 3 and Glidescope Grade View: Grade I Tube size: 7.5 mm Number of attempts: 1 Airway Equipment and Method: Stylet and Video-laryngoscopy Placement Confirmation: ETT inserted through vocal cords under direct vision,  Breath sounds checked- equal and bilateral and CO2 detector Tube secured with: ETT holder Dental Injury: Teeth and Oropharynx as per pre-operative assessment         Sedatives: 15 mg Etomidate Paralytic: 100 mg Succinylcholine   CRITICAL CARE Performed by: Torence Palmeri L Marcelis Wissner Total critical care time: 38 minutes Critical care  time was exclusive of separately billable procedures and treating other patients. Critical care was necessary to treat or prevent imminent or life-threatening deterioration. Critical care was time spent personally by me on the following activities: development of treatment plan with patient and/or surrogate as well as nursing, discussions with consultants, evaluation of patient's response to treatment, examination of patient, obtaining history from patient or surrogate, ordering and performing treatments and interventions, ordering and review of laboratory studies, ordering and review of radiographic studies, pulse oximetry and re-evaluation of patient's condition. c        (  including critical care time)  Medications Ordered in ED Medications  midazolam (VERSED) 50 mg in sodium chloride 0.9 % 50 mL (1 mg/mL) infusion (3 mg/hr Intravenous Rate/Dose Change 09/05/16 0323)  albuterol (PROVENTIL,VENTOLIN) solution continuous neb (10 mg/hr Nebulization Given 09/05/16 0027)  ipratropium (ATROVENT) nebulizer solution 0.5 mg (0.5 mg Nebulization Given 09/05/16 0027)  methylPREDNISolone sodium succinate (SOLU-MEDROL) 125 mg/2 mL injection 125 mg (125 mg Intravenous Given 09/05/16 0026)  etomidate (AMIDATE) injection (15 mg Intravenous Given 09/05/16 0230)  succinylcholine (ANECTINE) injection (100 mg Intravenous Given 09/05/16 0231)     Initial Impression / Assessment and Plan / ED Course  I have reviewed the triage vital signs and the nursing notes.  Pertinent labs & imaging results that were available during my care of the patient were reviewed by me and considered in my medical decision making (see chart for details).  Clinical Course     COORDINATION OF CARE: 12:24 AM- Patient was started on BiPAP. Will order continuous albuterol nebulizer, Atrovent nebulizer and injection of SoluMedrol IV. Will order CXR.  1240 on recheck patient indicates she's feeling better on the BiPAP. She is getting  her continuous nebulizer.  1:15 AM ABG shows significant restoration acidosis, she has been on the BiPAP and still on the continuous nebulizer. I'm going to repeat the blood gas. I've talked to the family and patient that she may require intubation and they seem to understand. Patient indicates she's feeling better on the BiPAP. She is sleeping but arousable.  After reviewing the second blood gas she has had no improvement. Patient was prepared for intubation.  Patient discussed with Dr. Olevia Bowens hospitalist at 3:02 AM. We found out Dr. Luan Pulling is not working this weekend. He states patient will have to go to another facility.  3:32 AM Dr. Jimmy Footman, critical care, accepts in transfer to [facility] ICU, attending Dr. Elsworth Soho.  04:05 AM nurse reports blood pressure in the 71s. Her Versed drip was dropped back. She was given fluid bolus.  Final Clinical Impressions(s) / ED Diagnoses   Final diagnoses:  Acute respiratory failure with hypercapnia ([place])  COPD exacerbation ([place])    Plan transfer to [facility] for admission    I personally performed the services described in this documentation, which was scribed in my presence. The recorded information has been reviewed and considered.  Rolland Porter, MD, Barbette Or, MD 09/05/16 [phone]

## 2016-09-05 NOTE — H&P (Signed)
PULMONARY / CRITICAL CARE MEDICINE   Name: Olivia Randall MRN: [id number] DOB: April 10, 1951    ADMISSION DATE:  09/05/2016  REFERRING MD:  EDP  CHIEF COMPLAINT:  Respiratory distress  HISTORY OF PRESENT ILLNESS:   65 year old woman with COPD on 3 L home oxygen and multiple sclerosis was brought in by [facility] emergency department for respiratory distress. EMS found her oxygen saturation to be 74% on arrival. She had called EMS twice in the same day. She was noted to have acute respiratory acidosis on ABG, failed BiPAP and continues nebs and was eventually intubated. She was then transferred to ICU at [facility]. No other history available since she is currently intubated and sedated Medication review shows high dose Xanax presumably for anxiety and narcotics. She is also on medications for dementia  PAST MEDICAL HISTORY :  She  has a past medical history of Anxiety and depression; Asthma; Chronic back pain; Chronic neck pain; COPD (chronic obstructive pulmonary disease) ([place]); Dementia; Depression; GERD (gastroesophageal reflux disease); Hypertension; MS (multiple sclerosis) ([place]); MS (multiple sclerosis) ([place]); On home O2; Shortness of breath dyspnea; and Sleep apnea.  PAST SURGICAL HISTORY: She  has a past surgical history that includes Abdominal hysterectomy; Bladder surgery; Hemorrhoid surgery; Carpal tunnel release; Neck surgery; Shoulder surgery (Right); Esophagogastroduodenoscopy (2009); Cholecystectomy; Breast surgery; Esophagogastroduodenoscopy (egd) with propofol (N/A, 04/24/2015); maloney dilation (N/A, 04/24/2015); and biopsy (04/24/2015).  No Known Allergies  No current facility-administered medications on file prior to encounter.    Current Outpatient Prescriptions on File Prior to Encounter  Medication Sig  . ADVAIR HFA 230-21 MCG/ACT inhaler Inhale 1 puff into the lungs 2 (two) times daily.  [name] albuterol (PROAIR HFA) 108 (90 BASE) MCG/ACT inhaler Inhale 2 puffs into the lungs  every 6 (six) hours as needed for shortness of breath.   [name] albuterol (PROVENTIL) (2.5 MG/3ML) 0.083% nebulizer solution Take 2.5 mg by nebulization every 6 (six) hours as needed for shortness of breath (Mixed with Ipratropium).   . ALPRAZolam (XANAX) 1 MG tablet Take 1 mg by mouth 4 (four) times daily as needed for anxiety.   . Aspirin-Acetaminophen-Caffeine (GOODY HEADACHE PO) Take 1 Package by mouth daily as needed (Pain).   [name] clopidogrel (PLAVIX) 75 MG tablet Take 75 mg by mouth daily.   [name] donepezil (ARICEPT) 10 MG tablet Take 10 mg by mouth at bedtime.   [name] esomeprazole (NEXIUM) 40 MG capsule Take 40 mg by mouth daily.  . furosemide (LASIX) 20 MG tablet Take 20 mg by mouth every morning.   [name] lisinopril (PRINIVIL,ZESTRIL) 10 MG tablet TAKE ONE (1) TABLET EACH DAY  . loratadine (CLARITIN) 10 MG tablet Take 10 mg by mouth every morning.   . meloxicam (MOBIC) 7.5 MG tablet Take 7.5 mg by mouth every morning.  . Oxycodone HCl 20 MG TABS Take 20 mg by mouth 4 (four) times daily as needed (for pain).   [name] PARoxetine (PAXIL) 40 MG tablet Take 40 mg by mouth every morning.  . potassium chloride (K-DUR) 10 MEQ tablet Take 1 tablet by mouth daily.  . predniSONE (STERAPRED UNI-PAK 21 TAB) 10 MG (21) TBPK tablet Take 6 tabs by mouth daily  for 2 days, then 5 tabs for 2 days, then 4 tabs for 2 days, then 3 tabs for 2 days, 2 tabs for 2 days, then 1 tab by mouth daily for 2 days  . SPIRIVA HANDIHALER 18 MCG inhalation capsule Take 18 mcg by mouth daily.     FAMILY HISTORY:  Her indicated that the status of her mother is unknown. She indicated that the status of her father is unknown. She indicated that the status of her brother is unknown. She indicated that the status of her neg hx is unknown.    SOCIAL HISTORY: She  reports that she has been smoking Cigarettes.  She does not have any smokeless tobacco history on file. She reports that she does not drink alcohol or use drugs.  REVIEW OF SYSTEMS:    Unable to obtain  SUBJECTIVE:    VITAL SIGNS: BP 132/76 (BP Location: Left Arm)   Pulse (!) 102   Temp 98.1 F (36.7 C) (Axillary)   Resp 16   Ht '5\' 3"'$  (1.6 m)   Wt 113 lb 5.1 oz (51.4 kg)   SpO2 98%   BMI 20.07 kg/m   HEMODYNAMICS:    VENTILATOR SETTINGS: Vent Mode: PRVC FiO2 (%):  [30 %-80 %] 30 % Set Rate:  [14 bmp-28 bmp] 20 bmp Vt Set:  [320 mL-500 mL] 320 mL PEEP:  [5 cmH20] 5 cmH20 Pressure Support:  [5 cmH20] 5 cmH20 Plateau Pressure:  [18 cmH20-25 cmH20] 18 cmH20  INTAKE / OUTPUT: I/O last 3 completed shifts: In: 549.6 [I.V.:519.6; NG/GT:30] Out: 200 [Urine:200]  PHYSICAL EXAMINATION: Gen. thin, in the [place] a apical pulse non-since  [facility]. in no distress, normal affect ENT - no lesions, no post nasal drip Neck: No JVD, no thyromegaly, no carotid bruits Lungs:  use of accessory muscles, no dullness to percussion, decreased without rales or rhonchi  Cardiovascular: Rhythm regular, heart sounds  normal, no murmurs or gallops, no peripheral edema Abdomen: soft and non-tender, no hepatosplenomegaly, BS normal. Musculoskeletal: No deformities, no cyanosis or clubbing Neuro:  awake, non focal   LABS:  BMET  Recent Labs Lab 09/05/16 0033  NA 135  K 3.3*  CL 86*  CO2 39*  BUN 11  CREATININE 0.67  GLUCOSE 133*    Electrolytes  Recent Labs Lab 09/05/16 0033  CALCIUM 10.1    CBC  Recent Labs Lab 09/05/16 0033  WBC 9.9  HGB 13.2  HCT 44.2  PLT 141*    Coag's No results for input(s): APTT, INR in the last 168 hours.  Sepsis Markers No results for input(s): LATICACIDVEN, PROCALCITON, O2SATVEN in the last 168 hours.  ABG  Recent Labs Lab 09/05/16 0825 09/05/16 0944 09/05/16 1645  PHART 7.502* 7.529* 7.352  PCO2ART 48.5* 43.5 67.2*  PO2ART 110.0* 127.0* 55.0*    Liver Enzymes  Recent Labs Lab 09/05/16 0033  AST 25  ALT 15  ALKPHOS 94  BILITOT 0.4  ALBUMIN 3.6    Cardiac Enzymes  Recent Labs Lab  09/05/16 0033  TROPONINI <0.03    Glucose No results for input(s): GLUCAP in the last 168 hours.  Imaging Dg Chest 1 View  Result Date: 09/05/2016 CLINICAL DATA:  65 year old female status post intubation. EXAM: CHEST 1 VIEW COMPARISON:  Chest radiograph dated 09/05/2016 FINDINGS: There is interval placement of an endotracheal tube with tip approximately 13 mm above the carina. Recommend retraction by 3 cm for optimal positioning. An enteric tube courses into the left hemiabdomen with tip beyond the inferior margin of the image. There is emphysematous changes of the lungs with hyperinflation and chronic interstitial coarsening. Chronic right lung bases scarring noted. No focal consolidation, pleural effusion, or pneumothorax. The cardiac silhouette is within normal limits. Lower cervical fixation plate and screws partially visualized. No acute osseous pathology. IMPRESSION: Endotracheal tube the tip approximately 13  mm above the carina. Recommend retraction by 3 cm for optimal positioning. Enteric tube extends into the left hemiabdomen with tip beyond the inferior margin of the image. Emphysema.  No acute cardiopulmonary process. Electronically Signed   By: Anner Crete M.D.   On: 09/05/2016 03:17   Dg Chest Port 1 View  Result Date: 09/05/2016 CLINICAL DATA:  Intubation, history COPD, hypertension, multiple sclerosis EXAM: PORTABLE CHEST 1 VIEW COMPARISON:  Portable exam 0634 hours compared to 09/05/2016 at 0243 hours FINDINGS: Tip of endotracheal tube projects 3.1 cm above carina. Nasogastric tube extends into stomach. Normal heart size, mediastinal contours, and pulmonary vascularity. Atherosclerotic calcification aorta. Emphysematous changes with small RIGHT pleural effusion and minimal RIGHT basilar atelectasis. LEFT apical bleb disease. Lungs otherwise clear. No pneumothorax. Bones demineralized with evidence of prior cervicothoracic fusion. IMPRESSION: Emphysematous changes with small  RIGHT pleural effusion and basilar atelectasis. Underlying emphysematous changes with LEFT apical blebs. Electronically Signed   By: Lavonia Dana M.D.   On: 09/05/2016 09:20   Dg Chest Port 1 View  Result Date: 09/05/2016 CLINICAL DATA:  Respiratory distress, onset earlier today. Worsened tonight. EXAM: PORTABLE CHEST 1 VIEW COMPARISON:  1127 FINDINGS: Chronic blunting of the right lateral costophrenic angle. The extreme left lateral costophrenic angle is excluded from the image. The visible lungs are clear. Pulmonary vasculature is slightly prominent. No airspace consolidation. Unremarkable hilar and mediastinal contours. IMPRESSION: Slight prominence of the pulmonary vasculature. No interstitial or alveolar edema. Electronically Signed   By: Andreas Newport M.D.   On: 09/05/2016 00:35     STUDIES:    CULTURES: resp 12/24 >>he Flu >>   ANTIBIOTICS: levaquin 12/24 >>  SIGNIFICANT EVENTS: 12/24 tr from [facility]  LINES/TUBES: ETT 12/24 >>  DISCUSSION:   Intubated for acute exacerbation of COPD  ASSESSMENT / PLAN:  PULMONARY A: Acute hypercarbic respiratory failure AECOPD P:   PRVC-vent settings reviewed and adjusted Keep respiratory rate at 20 to avoid auto PEEP ABG shows respiratory alkalosis now suggesting overventilation  CARDIOVASCULAR A:  Hypertension P:  Check troponin 1 Ct plavix  RENAL A:   Hypokalemia Oliguria P:   Repeat Hold lisinopril for now NS @ 100/h, give 500 bolus  GASTROINTESTINAL A:   Protein calorie malnutrition P:   Start tube feeds if remains intubated more than 24 hours  HEMATOLOGIC A:   No issues P:  Follow CBC  INFECTIOUS A:   LRI P:   Empiric Levaquin  ENDOCRINE A:   No issues  P:   check CBG while on Solu-Medrol    NEUROLOGIC A:   Multiple sclerosis ? Dementia P:   RASS goal: 0 Fent/ versed prn   FAMILY  - Updates: none at bedside  - Inter-disciplinary family meet or Palliative Care meeting due by:   day 7   Cc time x 81m RKara MeadMD. [facility] Pager [phone] no response call [phone]     09/05/2016, 5:43 PM

## 2016-09-05 NOTE — ED Notes (Signed)
Pt is more alert. Family is at the bedside- She reports breathing better.   Daughter is extremely anxious asking is she (mother) is really sick  Reassurances to family that we are trying to help pt

## 2016-09-05 NOTE — Progress Notes (Signed)
Reported to Dr. Elsworth Soho minimal urine with decrease in the last few hours.  150 ml output since 7am.  Per MD to give 248m bolus, start on NS '@100ML'$ /HR, and monitor urine output for 5-6 hours to see if output picks up.  Will continue to assess.

## 2016-09-05 NOTE — ED Notes (Signed)
Call to respiratory after daughter report hitting machine ad disconnecting something

## 2016-09-05 NOTE — ED Notes (Signed)
Family support Questions encouraged and answered.

## 2016-09-05 NOTE — ED Notes (Signed)
CRITICAL VALUE ALERT  Critical value received:  Ph 7.243, CO2 98.4, O2 299, Bicarb 33.8,   Date of notification: 09/05/2016  Time of notification:  00:49  Critical value read back: yes  Nurse who received alert:  Luis Abed   MD notified (1st page):  Dr Tomi Bamberger  Time of first page:  00:49  MD notified (2nd page):  Time of second page:  Responding MD:  Dr Tomi Bamberger  Time MD responded:  00:49

## 2016-09-05 NOTE — ED Triage Notes (Signed)
Pt c/o sob that became worse today, ems reports that they were called out earlier today and pt refused, pt contacted ems again due to increasing sob, pulse ox on 3 liter oxygen upon their arrival was 78%, pt received albuterol 2.5 mg treatment enroute with ems,

## 2016-09-05 NOTE — ED Notes (Signed)
Respiratory called regarding blood gas

## 2016-09-05 NOTE — ED Notes (Signed)
Family support- Spouse to bedside with assurances to children that they both might be at bedside. Explained that mother has tubes and why for her care.   Verbalizes understanding of need of tubes and why

## 2016-09-05 NOTE — ED Notes (Signed)
ED Provider at bedside. 

## 2016-09-05 NOTE — ED Notes (Signed)
Pt is dry to the touch- appears to be breathing better at this time  Daughter states that Everyone smokes in the home

## 2016-09-05 NOTE — ED Notes (Signed)
Dr Tomi Bamberger made aware of pt BP- Orders for 500 cc bolus of NSS

## 2016-09-05 NOTE — ED Notes (Signed)
Respiratory in to check and reassess

## 2016-09-05 NOTE — ED Notes (Addendum)
Family at bedside   Respiratory at bedside

## 2016-09-05 NOTE — ED Notes (Signed)
Report to [place], [place]

## 2016-09-06 LAB — POCT I-STAT 3, ART BLOOD GAS (G3+)
ACID-BASE EXCESS: 10 mmol/L — AB (ref 0.0–2.0)
Bicarbonate: 36.6 mmol/L — ABNORMAL HIGH (ref 20.0–28.0)
O2 SAT: 96 %
PCO2 ART: 58.1 mmHg — AB (ref 32.0–48.0)
TCO2: 38 mmol/L (ref 0–100)
pH, Arterial: 7.407 (ref 7.350–7.450)
pO2, Arterial: 87 mmHg (ref 83.0–108.0)

## 2016-09-06 LAB — BASIC METABOLIC PANEL
Anion gap: 4 — ABNORMAL LOW (ref 5–15)
BUN: 15 mg/dL (ref 6–20)
CALCIUM: 9.2 mg/dL (ref 8.9–10.3)
CO2: 38 mmol/L — ABNORMAL HIGH (ref 22–32)
CREATININE: 0.71 mg/dL (ref 0.44–1.00)
Chloride: 97 mmol/L — ABNORMAL LOW (ref 101–111)
Glucose, Bld: 118 mg/dL — ABNORMAL HIGH (ref 65–99)
Potassium: 3.4 mmol/L — ABNORMAL LOW (ref 3.5–5.1)
SODIUM: 139 mmol/L (ref 135–145)

## 2016-09-06 LAB — CBC
HCT: 31 % — ABNORMAL LOW (ref 36.0–46.0)
Hemoglobin: 9.6 g/dL — ABNORMAL LOW (ref 12.0–15.0)
MCH: 26.7 pg (ref 26.0–34.0)
MCHC: 31 g/dL (ref 30.0–36.0)
MCV: 86.1 fL (ref 78.0–100.0)
Platelets: 120 10*3/uL — ABNORMAL LOW (ref 150–400)
RBC: 3.6 MIL/uL — ABNORMAL LOW (ref 3.87–5.11)
RDW: 14.4 % (ref 11.5–15.5)
WBC: 5.7 10*3/uL (ref 4.0–10.5)

## 2016-09-06 LAB — MAGNESIUM: MAGNESIUM: 1.7 mg/dL (ref 1.7–2.4)

## 2016-09-06 LAB — GLUCOSE, CAPILLARY: Glucose-Capillary: 123 mg/dL — ABNORMAL HIGH (ref 65–99)

## 2016-09-06 LAB — PHOSPHORUS: PHOSPHORUS: 2.8 mg/dL (ref 2.5–4.6)

## 2016-09-06 MED ORDER — VITAL HIGH PROTEIN PO LIQD
1000.0000 mL | ORAL | Status: DC
  Administered 2016-09-06: 1000 mL
  Administered 2016-09-06 – 2016-09-07 (×4)

## 2016-09-06 MED ORDER — POTASSIUM CHLORIDE 20 MEQ/15ML (10%) PO SOLN
20.0000 meq | ORAL | Status: AC
  Administered 2016-09-06 (×2): 20 meq
  Filled 2016-09-06 (×2): qty 15

## 2016-09-06 MED ORDER — MAGNESIUM SULFATE 2 GM/50ML IV SOLN
2.0000 g | Freq: Once | INTRAVENOUS | Status: AC
  Administered 2016-09-06: 2 g via INTRAVENOUS
  Filled 2016-09-06: qty 50

## 2016-09-06 NOTE — Progress Notes (Signed)
[facility] ADULT ICU REPLACEMENT PROTOCOL FOR AM LAB REPLACEMENT ONLY  The patient does apply for the [facility] Adult ICU Electrolyte Replacment Protocol based on the criteria listed below:   1. Is GFR >/= 40 ml/min? Yes.    Patient's GFR today is >60 2. Is urine output >/= 0.5 ml/kg/hr for the last 6 hours? Yes.   Patient's UOP is  ml/kg/hr 3. Is BUN < 60 mg/dL? Yes.    Patient's BUN today is 15 4. Abnormal electrolyte(s): K 3.5, Mag 1.7 5. Ordered repletion with: protocol 6. If a panic level lab has been reported, has the CCM MD in charge been notified? no  Physician:    Ronda Fairly A 09/06/2016 5:34 AM

## 2016-09-06 NOTE — Progress Notes (Signed)
PULMONARY / CRITICAL CARE MEDICINE   Name: Olivia Randall MRN: [id number] DOB: [birth year]    ADMISSION DATE:  09/05/2016  REFERRING MD:  EDP  CHIEF COMPLAINT:  Respiratory distress  HISTORY OF PRESENT ILLNESS:   65 year old woman with COPD on 3 L home oxygen and multiple sclerosis was brought in by [facility] emergency department for respiratory distress. EMS found her oxygen saturation to be 74% on arrival. She had called EMS twice in the same day. She was noted to have acute respiratory acidosis on ABG, failed BiPAP and continues nebs and was eventually intubated. She was then transferred to ICU at [facility]. No other history available since she is currently intubated and sedated Medication review shows high dose Xanax presumably for anxiety and narcotics. She is also on medications for dementia   SUBJECTIVE:  Critically ill, intuabted Awake on propofol gtt UO low  VITAL SIGNS: BP 127/76   Pulse (!) 103   Temp 97.6 F (36.4 C) (Axillary)   Resp 13   Ht '5\' 3"'$  (1.6 m)   Wt 115 lb 15.4 oz (52.6 kg)   SpO2 94%   BMI 20.54 kg/m   HEMODYNAMICS:    VENTILATOR SETTINGS: Vent Mode: PSV;CPAP FiO2 (%):  [30 %-40 %] 30 % Set Rate:  [20 bmp-28 bmp] 20 bmp Vt Set:  [320 mL] 320 mL PEEP:  [5 cmH20] 5 cmH20 Pressure Support:  [5 cmH20-14 cmH20] 14 cmH20 Plateau Pressure:  [14 cmH20-22 cmH20] 22 cmH20  INTAKE / OUTPUT: I/O last 3 completed shifts: In: 2654.3 [I.V.:2114.3; NG/GT:240; IV Piggyback:300] Out: 371 [Urine:675]  PHYSICAL EXAMINATION: Gen. thin, acutely ill, oral [employer]. in no distress, normal affect ENT - no lesions, no post nasal drip Neck: No JVD, no thyromegaly, no carotid bruits Lungs:  use of accessory muscles, no dullness to percussion, decreased without rales or rhonchi  Cardiovascular: Rhythm regular, heart sounds  normal, no murmurs or gallops, no peripheral edema Abdomen: soft and non-tender, no hepatosplenomegaly, BS normal. Musculoskeletal: No  deformities, no cyanosis or clubbing Neuro:  awake, non focal   LABS:  BMET  Recent Labs Lab 09/05/16 0033 09/06/16 0337  NA 135 139  K 3.3* 3.4*  CL 86* 97*  CO2 39* 38*  BUN 11 15  CREATININE 0.67 0.71  GLUCOSE 133* 118*    Electrolytes  Recent Labs Lab 09/05/16 0033 09/06/16 0337  CALCIUM 10.1 9.2  MG  --  1.7  PHOS  --  2.8    CBC  Recent Labs Lab 09/05/16 0033 09/06/16 0337  WBC 9.9 5.7  HGB 13.2 9.6*  HCT 44.2 31.0*  PLT 141* 120*    Coag's No results for input(s): APTT, INR in the last 168 hours.  Sepsis Markers No results for input(s): LATICACIDVEN, PROCALCITON, O2SATVEN in the last 168 hours.  ABG  Recent Labs Lab 09/05/16 0944 09/05/16 1645 09/06/16 0354  PHART 7.529* 7.352 7.407  PCO2ART 43.5 67.2* 58.1*  PO2ART 127.0* 55.0* 87.0    Liver Enzymes  Recent Labs Lab 09/05/16 0033  AST 25  ALT 15  ALKPHOS 94  BILITOT 0.4  ALBUMIN 3.6    Cardiac Enzymes  Recent Labs Lab 09/05/16 0033  TROPONINI <0.03    Glucose No results for input(s): GLUCAP in the last 168 hours.  Imaging No results found.   STUDIES:    CULTURES: resp 12/24 >> Flu >> neg   ANTIBIOTICS: levaquin 12/24 >>  SIGNIFICANT EVENTS: 12/24 tr from [facility]  LINES/TUBES: ETT 12/24 >>  DISCUSSION:  Intubated for acute exacerbation of COPD  ASSESSMENT / PLAN:  PULMONARY A: Acute hypercarbic respiratory failure AECOPD P:   SBTs with goal extubation Keep respiratory rate at 20 to avoid auto PEEP ABG cmpensated  CARDIOVASCULAR A:  Hypertension P:  Ct plavix Hold lisinopril for now  RENAL A:   Hypokalemia /hypomag Oliguria P:   NS @ 100/h replete lytes  GASTROINTESTINAL A:   Protein calorie malnutrition P:   Start tube feeds  HEMATOLOGIC A:   No issues P:  Follow CBC  INFECTIOUS A:   LRI P:   Empiric Levaquin  ENDOCRINE A:   No issues  P:   check CBG while on Solu-Medrol    NEUROLOGIC A:   Multiple  sclerosis ? Dementia P:   RASS goal: 0 Fent/ versed prn Ct aricept Resume home xanax, hold oxycodone & w/f withdrawal   FAMILY  - Updates: none at bedside  - Inter-disciplinary family meet or Palliative Care meeting due by:  day 7   Cc time x 21m RKara MeadMD. [facility] Pager [phone] no response call [phone]     09/06/2016, 11:26 AM

## 2016-09-07 ENCOUNTER — Inpatient Hospital Stay (HOSPITAL_COMMUNITY): Payer: Medicare HMO

## 2016-09-07 LAB — CBC
HCT: 33 % — ABNORMAL LOW (ref 36.0–46.0)
Hemoglobin: 10 g/dL — ABNORMAL LOW (ref 12.0–15.0)
MCH: 26.3 pg (ref 26.0–34.0)
MCHC: 30.3 g/dL (ref 30.0–36.0)
MCV: 86.8 fL (ref 78.0–100.0)
PLATELETS: 136 10*3/uL — AB (ref 150–400)
RBC: 3.8 MIL/uL — AB (ref 3.87–5.11)
RDW: 14.7 % (ref 11.5–15.5)
WBC: 8 10*3/uL (ref 4.0–10.5)

## 2016-09-07 LAB — BASIC METABOLIC PANEL
ANION GAP: 6 (ref 5–15)
BUN: 21 mg/dL — ABNORMAL HIGH (ref 6–20)
CALCIUM: 9.6 mg/dL (ref 8.9–10.3)
CO2: 31 mmol/L (ref 22–32)
CREATININE: 0.7 mg/dL (ref 0.44–1.00)
Chloride: 102 mmol/L (ref 101–111)
GLUCOSE: 127 mg/dL — AB (ref 65–99)
Potassium: 4.5 mmol/L (ref 3.5–5.1)
Sodium: 139 mmol/L (ref 135–145)

## 2016-09-07 LAB — GLUCOSE, CAPILLARY
GLUCOSE-CAPILLARY: 120 mg/dL — AB (ref 65–99)
Glucose-Capillary: 129 mg/dL — ABNORMAL HIGH (ref 65–99)
Glucose-Capillary: 123 mg/dL — ABNORMAL HIGH (ref 65–99)
Glucose-Capillary: 107 mg/dL — ABNORMAL HIGH (ref 65–99)

## 2016-09-07 LAB — MAGNESIUM: Magnesium: 1.9 mg/dL (ref 1.7–2.4)

## 2016-09-07 LAB — PHOSPHORUS: PHOSPHORUS: 2.5 mg/dL (ref 2.5–4.6)

## 2016-09-07 MED ORDER — CHLORHEXIDINE GLUCONATE 0.12 % MT SOLN
OROMUCOSAL | Status: AC
  Filled 2016-09-07: qty 15

## 2016-09-07 MED ORDER — ADULT MULTIVITAMIN LIQUID CH
15.0000 mL | Freq: Every day | ORAL | Status: DC
  Administered 2016-09-07 – 2016-09-13 (×6): 15 mL
  Filled 2016-09-07 (×6): qty 15

## 2016-09-07 MED ORDER — VITAL AF 1.2 CAL PO LIQD
1000.0000 mL | ORAL | Status: DC
  Administered 2016-09-07 – 2016-09-09 (×3): 1000 mL

## 2016-09-07 MED ORDER — VITAL AF 1.2 CAL PO LIQD: 1000.0000 mL | ORAL | Status: DC

## 2016-09-07 MED ORDER — ADULT MULTIVITAMIN LIQUID CH: 15.0000 mL | Freq: Every day | ORAL | Status: DC

## 2016-09-07 NOTE — Progress Notes (Signed)
PULMONARY / CRITICAL CARE MEDICINE   Name: Olivia Randall MRN: [id number] DOB: July 20, 1951    ADMISSION DATE:  09/05/2016  REFERRING MD:  EDP  CHIEF COMPLAINT:  Respiratory distress  HISTORY OF PRESENT ILLNESS:  65 year old woman with COPD on 3 L home oxygen and multiple sclerosis was brought in by [facility] emergency department for respiratory distress. EMS found her oxygen saturation to be 74% on arrival. She had called EMS twice in the same day. She was noted to have acute respiratory acidosis on ABG, failed BiPAP and continues nebs and was eventually intubated. She was then transferred to ICU at [facility]. No other history available since she is currently intubated and sedated.  Medication review shows high dose Xanax presumably for anxiety and narcotics. She is also on medications for dementia.   SUBJECTIVE:   VITAL SIGNS: BP (!) 152/86   Pulse 98   Temp 97.8 F (36.6 C) (Oral)   Resp (!) 36   Ht '5\' 3"'$  (1.6 m)   Wt 53.1 kg (117 lb 1 oz)   SpO2 91%   BMI 20.74 kg/m   HEMODYNAMICS:    VENTILATOR SETTINGS: Vent Mode: PRVC FiO2 (%):  [30 %] 30 % Set Rate:  [20 bmp] 20 bmp Vt Set:  [320 mL] 320 mL PEEP:  [5 cmH20] 5 cmH20 Pressure Support:  [10 cmH20] 10 cmH20 Plateau Pressure:  [23 cmH20] 23 cmH20  INTAKE / OUTPUT: I/O last 3 completed shifts: In: 3792.7 [I.V.:3240.1; NG/GT:502.7; IV Piggyback:50] Out: 855 [Urine:855]  PHYSICAL EXAMINATION: Gen. thin, acutely ill, oral [employer]. in no distress, normal affect ENT - no lesions, no post nasal drip Neck: No JVD, no thyromegaly, no carotid bruits Lungs:  use of accessory muscles, no dullness to percussion, decreased without rales or rhonchi  Cardiovascular: Rhythm regular, heart sounds  normal, no murmurs or gallops, no peripheral edema Abdomen: soft and non-tender, no hepatosplenomegaly, BS normal. Musculoskeletal: No deformities, no cyanosis or clubbing Neuro:  awake, non focal   LABS:  BMET  Recent  Labs Lab 09/05/16 0033 09/06/16 0337 09/07/16 0312  NA 135 139 139  K 3.3* 3.4* 4.5  CL 86* 97* 102  CO2 39* 38* 31  BUN 11 15 21*  CREATININE 0.67 0.71 0.70  GLUCOSE 133* 118* 127*    Electrolytes  Recent Labs Lab 09/05/16 0033 09/06/16 0337 09/07/16 0312  CALCIUM 10.1 9.2 9.6  MG  --  1.7 1.9  PHOS  --  2.8 2.5    CBC  Recent Labs Lab 09/05/16 0033 09/06/16 0337 09/07/16 0312  WBC 9.9 5.7 8.0  HGB 13.2 9.6* 10.0*  HCT 44.2 31.0* 33.0*  PLT 141* 120* 136*    Coag's No results for input(s): APTT, INR in the last 168 hours.  Sepsis Markers No results for input(s): LATICACIDVEN, PROCALCITON, O2SATVEN in the last 168 hours.  ABG  Recent Labs Lab 09/05/16 0944 09/05/16 1645 09/06/16 0354  PHART 7.529* 7.352 7.407  PCO2ART 43.5 67.2* 58.1*  PO2ART 127.0* 55.0* 87.0    Liver Enzymes  Recent Labs Lab 09/05/16 0033  AST 25  ALT 15  ALKPHOS 94  BILITOT 0.4  ALBUMIN 3.6    Cardiac Enzymes  Recent Labs Lab 09/05/16 0033  TROPONINI <0.03    Glucose  Recent Labs Lab 09/06/16 2222 09/07/16 0850 09/07/16 1105  GLUCAP 123* 129* 123*    Imaging Dg Chest Port 1 View  Result Date: 09/07/2016 CLINICAL DATA:  Respiratory failure. EXAM: PORTABLE CHEST 1 VIEW COMPARISON:  09/05/2016. FINDINGS: Endotracheal tube 1.5 cm above the carina. NG tube appears to be below left hemidiaphragm. The hemidiaphragms however not completely imaged. Heart size stable. Persistent low lung volumes. Changes consistent COPD again noted. Mild increase interstitial markings are noted. An active interstitial process cannot be completely excluded. No prominent pleural effusion. No pneumothorax. Cervicothoracic spine fusion. IMPRESSION: 1. Endotracheal tube tip 1.5 cm above the carina. NG tube tip appears to be below the left hemidiaphragm however the hemidiaphragms are not completely imaged. 2. Changes of COPD again noted. Mild increased interstitial markings are noted. An  active interstitial process including pneumonitis cannot be excluded. Electronically Signed   By: Marcello Moores  Register   On: 09/07/2016 07:22     STUDIES:    CULTURES: Resp 12/24 >> Flu 12/24 >> neg  ANTIBIOTICS: Levaquin 12/24 >>  SIGNIFICANT EVENTS: 12/24 Tx from [facility]  LINES/TUBES: ETT 12/24 >>  DISCUSSION: 65 y/o F with a PMH of COPD admitted with SOB, intubated for acute exacerbation of COPD  ASSESSMENT / PLAN:  PULMONARY A: Acute Hypercarbic Respiratory Failure AECOPD P:   PRVC 8cc/kg, rate 20 Daily SBT / WUA Trend CXR Duoneb Q4 Solu-medrol Q8  CARDIOVASCULAR A:  Hypertension P:  Ct plavix Hold lisinopril for now  RENAL A:   Hypokalemia /hypomag Oliguria P:   NS @ 100/hr Trend BMP / UOP Replace electrolytes as indicated  GASTROINTESTINAL A:   Protein calorie malnutrition P:   TF per nutrition  NPO  PPI for SUP  HEMATOLOGIC A:   Anemia  Mild Thrombocytopenia  P:  Follow CBC Monitor for bleeding  Lovenox for DVT prophylaxis   INFECTIOUS A:   AECOPD  P:   Empiric Levaquin Monitor fever curve / WBC  ENDOCRINE A:   Hyperglycemia  P:   Monitor CBG while on Solu-Medrol    NEUROLOGIC A:   Multiple sclerosis ? Dementia P:   RASS goal: 0 Propofol for sedation  PRN fentanyl / versed  Ct aricept Resume home xanax Hold home oxycodone & monitor withdrawal   FAMILY  - Updates: No family at bedside am 12/25.   - Inter-disciplinary family meet or Palliative Care meeting due by:  day 7   CC Time: 30 minutes   Noe Gens, NP-C [place] [facility] & Critical [facility] Pgr: [phone] or if no answer [phone] 09/07/2016, 11:36 AM  Attending Note:  65 year old female with PMH of COPD who presents to the hospital with COPD who was intubated for AMS and inability to protect her airway.  On exam, decreased BS diffusely with mild end exp wheezes noted.  I reviewed CXR myself, hyperinflation noted and ETT in good position.  Will continue  weaning efforts.  Minimize sedation as able.  Keep fluid even at this point.  Will hopefully be able to avoid trach and need for long term wean.  Continue steroids, bronchodilators and abx.  I attempted weaning the patient bedside for 20 minutes with little success.  High RR and low Tv.  Keep in ICU and f/u on cultures.  The patient is critically ill with multiple organ systems failure and requires high complexity decision making for assessment and support, frequent evaluation and titration of therapies, application of advanced monitoring technologies and extensive interpretation of multiple databases.   Critical Care Time devoted to patient care services described in this note is  45  Minutes. This time reflects time of care of this signee Dr Jennet Maduro. This critical care time does not reflect procedure time, or  teaching time or supervisory time of PA/NP/Med student/Med Resident etc but could involve care discussion time.  Rush Farmer, M.D. [facility] Pulmonary/Critical Care Medicine. Pager: [phone]. After hours pager: [phone].

## 2016-09-07 NOTE — Progress Notes (Signed)
PULMONARY / CRITICAL CARE MEDICINE   Name: Olivia Randall MRN: [id number] DOB: [birth year]    ADMISSION DATE:  09/05/2016  REFERRING MD:  EDP  CHIEF COMPLAINT:  Respiratory distress  HISTORY OF PRESENT ILLNESS:  65 year old woman with COPD on 3 L home oxygen and multiple sclerosis was brought in by [facility] emergency department for respiratory distress. EMS found her oxygen saturation to be 74% on arrival. She had called EMS twice in the same day. She was noted to have acute respiratory acidosis on ABG, failed BiPAP and continues nebs and was eventually intubated. She was then transferred to ICU at [facility]. No other history available since she is currently intubated and sedated.  Medication review shows high dose Xanax presumably for anxiety and narcotics. She is also on medications for dementia.   SUBJECTIVE:   VITAL SIGNS: BP 128/64 (BP Location: Left Arm)   Pulse 77   Temp 97.8 F (36.6 C) (Oral)   Resp 20   Ht '5\' 3"'$  (1.6 m)   Wt 117 lb 1 oz (53.1 kg)   SpO2 95%   BMI 20.74 kg/m   HEMODYNAMICS:    VENTILATOR SETTINGS: Vent Mode: PRVC FiO2 (%):  [30 %] 30 % Set Rate:  [20 bmp] 20 bmp Vt Set:  [320 mL] 320 mL PEEP:  [5 cmH20] 5 cmH20 Pressure Support:  [10 cmH20-14 cmH20] 10 cmH20 Plateau Pressure:  [23 cmH20] 23 cmH20  INTAKE / OUTPUT: I/O last 3 completed shifts: In: 3792.7 [I.V.:3240.1; NG/GT:502.7; IV Piggyback:50] Out: 855 [Urine:855]  PHYSICAL EXAMINATION: Gen. thin, acutely ill, oral [employer]. in no distress, normal affect ENT - no lesions, no post nasal drip Neck: No JVD, no thyromegaly, no carotid bruits Lungs:  use of accessory muscles, no dullness to percussion, decreased without rales or rhonchi  Cardiovascular: Rhythm regular, heart sounds  normal, no murmurs or gallops, no peripheral edema Abdomen: soft and non-tender, no hepatosplenomegaly, BS normal. Musculoskeletal: No deformities, no cyanosis or clubbing Neuro:  awake, non  focal   LABS:  BMET  Recent Labs Lab 09/05/16 0033 09/06/16 0337 09/07/16 0312  NA 135 139 139  K 3.3* 3.4* 4.5  CL 86* 97* 102  CO2 39* 38* 31  BUN 11 15 21*  CREATININE 0.67 0.71 0.70  GLUCOSE 133* 118* 127*    Electrolytes  Recent Labs Lab 09/05/16 0033 09/06/16 0337 09/07/16 0312  CALCIUM 10.1 9.2 9.6  MG  --  1.7 1.9  PHOS  --  2.8 2.5    CBC  Recent Labs Lab 09/05/16 0033 09/06/16 0337 09/07/16 0312  WBC 9.9 5.7 8.0  HGB 13.2 9.6* 10.0*  HCT 44.2 31.0* 33.0*  PLT 141* 120* 136*    Coag's No results for input(s): APTT, INR in the last 168 hours.  Sepsis Markers No results for input(s): LATICACIDVEN, PROCALCITON, O2SATVEN in the last 168 hours.  ABG  Recent Labs Lab 09/05/16 0944 09/05/16 1645 09/06/16 0354  PHART 7.529* 7.352 7.407  PCO2ART 43.5 67.2* 58.1*  PO2ART 127.0* 55.0* 87.0    Liver Enzymes  Recent Labs Lab 09/05/16 0033  AST 25  ALT 15  ALKPHOS 94  BILITOT 0.4  ALBUMIN 3.6    Cardiac Enzymes  Recent Labs Lab 09/05/16 0033  TROPONINI <0.03    Glucose  Recent Labs Lab 09/06/16 2222  GLUCAP 123*    Imaging Dg Chest Port 1 View  Result Date: 09/07/2016 CLINICAL DATA:  Respiratory failure. EXAM: PORTABLE CHEST 1 VIEW COMPARISON:  09/05/2016. FINDINGS:  Endotracheal tube 1.5 cm above the carina. NG tube appears to be below left hemidiaphragm. The hemidiaphragms however not completely imaged. Heart size stable. Persistent low lung volumes. Changes consistent COPD again noted. Mild increase interstitial markings are noted. An active interstitial process cannot be completely excluded. No prominent pleural effusion. No pneumothorax. Cervicothoracic spine fusion. IMPRESSION: 1. Endotracheal tube tip 1.5 cm above the carina. NG tube tip appears to be below the left hemidiaphragm however the hemidiaphragms are not completely imaged. 2. Changes of COPD again noted. Mild increased interstitial markings are noted. An active  interstitial process including pneumonitis cannot be excluded. Electronically Signed   By: Marcello Moores  Register   On: 09/07/2016 07:22     STUDIES:    CULTURES: Resp 12/24 >> Flu 12/24 >> neg  ANTIBIOTICS: Levaquin 12/24 >>  SIGNIFICANT EVENTS: 12/24 Tx from [facility]  LINES/TUBES: ETT 12/24 >>  DISCUSSION: 65 y/o F with a PMH of COPD admitted with SOB, intubated for acute exacerbation of COPD  ASSESSMENT / PLAN:  PULMONARY A: Acute Hypercarbic Respiratory Failure AECOPD P:   PRVC 8cc/kg, rate 20 Daily SBT / WUA Trend CXR Duoneb Q4 Solu-medrol Q8  CARDIOVASCULAR A:  Hypertension P:  Ct plavix Hold lisinopril for now  RENAL A:   Hypokalemia /hypomag Oliguria P:   NS @ 100/hr Trend BMP / UOP Replace electrolytes as indicated  GASTROINTESTINAL A:   Protein calorie malnutrition P:   TF per nutrition  NPO  PPI for SUP  HEMATOLOGIC A:   Anemia  Mild Thrombocytopenia  P:  Follow CBC Monitor for bleeding  Lovenox for DVT prophylaxis   INFECTIOUS A:   AECOPD  P:   Empiric Levaquin Monitor fever curve / WBC  ENDOCRINE A:   Hyperglycemia  P:   Monitor CBG while on Solu-Medrol    NEUROLOGIC A:   Multiple sclerosis ? Dementia P:   RASS goal: 0 Propofol for sedation  PRN fentanyl / versed  Ct aricept Resume home xanax Hold home oxycodone & monitor withdrawal   FAMILY  - Updates: No family at bedside am 12/25.   - Inter-disciplinary family meet or Palliative Care meeting due by:  day 7   CC Time: 14 minutes   Noe Gens, NP-C [place] [facility] Pgr: [phone] or if no answer [phone] 09/07/2016, 9:09 AM

## 2016-09-07 NOTE — Progress Notes (Signed)
Initial Nutrition Assessment  DOCUMENTATION CODES:   Not applicable  INTERVENTION:    D/C Vital High Protein    Initiate Vital AF 1.2 at goal rate of 40 ml/h (960 ml per day)    Liquid MVI daily   TF regimen + Propofol infusion to provide total of 1132 kcals, 72 gm protein,  778 ml free water daily  NUTRITION DIAGNOSIS:   Inadequate oral intake related to inability to eat as evidenced by NPO status  GOAL:   Patient will meet greater than or equal to 90% of their needs  MONITOR:   TF tolerance, Vent status, Labs, Weight trends, I & O's  REASON FOR ASSESSMENT:   Consult Enteral/tube feeding initiation and management  ASSESSMENT:   65 yo Female with COPD on 3 L home oxygen and multiple sclerosis was brought in by [facility] emergency department for respiratory distress. EMS found her oxygen saturation to be 74% on arrival. She had called EMS twice in the same day. She was noted to have acute respiratory acidosis on ABG, failed BiPAP and continues nebs and was eventually intubated. She was then transferred to ICU at [facility].  Patient is currently intubated on ventilator support MV: 6.0 L/min Temp (24hrs), Avg:98.1 F (36.7 C), Min:97.8 F (36.6 C), Max:98.8 F (37.1 C)  Propofol: 6.5 ml/hr >> 172 fat kcals  Pt admitted with SOB and acute exacerbation of COPD. Vital High Protein started 12/25 via Adult Tube Feeding Protocol. Currently infusing at 20 ml/hr via OGT. Labs and medications reviewed. CBG's 123-129-123.  RD unable to complete Nutrition Focused Physical Exam at this time.  Diet Order:  Diet NPO time specified Except for: Sips with Meds  Skin:  Reviewed, no issues  Last BM:  12/22  Height:   Ht Readings from Last 1 Encounters:  09/05/16 '5\' 3"'$  (1.6 m)    Weight:   Wt Readings from Last 1 Encounters:  09/07/16 117 lb 1 oz (53.1 kg)    Ideal Body Weight:  52.2 kg  BMI:  Body mass index is 20.74 kg/m.  Estimated Nutritional Needs:    Kcal:  1171  Protein:  75-85 gm  Fluid:  per MD  EDUCATION NEEDS:   No education needs identified at this time  Arthur Holms, RD, LDN Pager #: [phone] After-Hours Pager #: [phone]

## 2016-09-08 ENCOUNTER — Inpatient Hospital Stay (HOSPITAL_COMMUNITY): Payer: Medicare HMO

## 2016-09-08 LAB — CBC
HCT: 35.5 % — ABNORMAL LOW (ref 36.0–46.0)
Hemoglobin: 10.4 g/dL — ABNORMAL LOW (ref 12.0–15.0)
MCH: 26.2 pg (ref 26.0–34.0)
MCHC: 29.3 g/dL — ABNORMAL LOW (ref 30.0–36.0)
MCV: 89.4 fL (ref 78.0–100.0)
PLATELETS: 161 10*3/uL (ref 150–400)
RBC: 3.97 MIL/uL (ref 3.87–5.11)
RDW: 14.9 % (ref 11.5–15.5)
WBC: 9.2 10*3/uL (ref 4.0–10.5)

## 2016-09-08 LAB — BASIC METABOLIC PANEL
ANION GAP: 4 — AB (ref 5–15)
BUN: 23 mg/dL — ABNORMAL HIGH (ref 6–20)
CO2: 33 mmol/L — ABNORMAL HIGH (ref 22–32)
CREATININE: 0.52 mg/dL (ref 0.44–1.00)
Calcium: 9.7 mg/dL (ref 8.9–10.3)
Chloride: 105 mmol/L (ref 101–111)
Glucose, Bld: 136 mg/dL — ABNORMAL HIGH (ref 65–99)
Potassium: 5 mmol/L (ref 3.5–5.1)
SODIUM: 142 mmol/L (ref 135–145)

## 2016-09-08 LAB — GLUCOSE, CAPILLARY
GLUCOSE-CAPILLARY: 134 mg/dL — AB (ref 65–99)
GLUCOSE-CAPILLARY: 112 mg/dL — AB (ref 65–99)
GLUCOSE-CAPILLARY: 118 mg/dL — AB (ref 65–99)
GLUCOSE-CAPILLARY: 98 mg/dL (ref 65–99)
Glucose-Capillary: 102 mg/dL — ABNORMAL HIGH (ref 65–99)
Glucose-Capillary: 128 mg/dL — ABNORMAL HIGH (ref 65–99)

## 2016-09-08 LAB — PHOSPHORUS: PHOSPHORUS: 2.8 mg/dL (ref 2.5–4.6)

## 2016-09-08 LAB — TRIGLYCERIDES: TRIGLYCERIDES: 52 mg/dL (ref <150)

## 2016-09-08 LAB — MAGNESIUM: Magnesium: 2 mg/dL (ref 1.7–2.4)

## 2016-09-08 MED ORDER — HYDRALAZINE HCL 20 MG/ML IJ SOLN
10.0000 mg | INTRAMUSCULAR | Status: DC | PRN
  Administered 2016-09-08: 40 mg via INTRAVENOUS
  Administered 2016-09-08: 10 mg via INTRAVENOUS
  Administered 2016-09-09: 20 mg via INTRAVENOUS
  Filled 2016-09-08 (×2): qty 1
  Filled 2016-09-08: qty 2

## 2016-09-08 MED ORDER — FUROSEMIDE 10 MG/ML IJ SOLN
40.0000 mg | Freq: Three times a day (TID) | INTRAMUSCULAR | Status: AC
  Administered 2016-09-08 (×2): 40 mg via INTRAVENOUS
  Filled 2016-09-08 (×2): qty 4

## 2016-09-08 MED FILL — Fentanyl Citrate Preservative Free (PF) Inj 100 MCG/2ML: INTRAMUSCULAR | Qty: 2 | Status: AC

## 2016-09-08 NOTE — Progress Notes (Addendum)
140 ml fentanyl wasted in sink. Witnessed by 2nd RN Tommy Rainwater.

## 2016-09-08 NOTE — Progress Notes (Signed)
Dr. Nelda Marseille paged, pt diaphoretic and increased work of breathing, RT called bedside and FI02 increased, MD to round, will continue to assess.  Olivia Randall 2:52 PM

## 2016-09-08 NOTE — Progress Notes (Signed)
PULMONARY / CRITICAL CARE MEDICINE   Name: Olivia Randall MRN: [id number] DOB: 1951/07/03    ADMISSION DATE:  09/05/2016  REFERRING MD:  EDP  CHIEF COMPLAINT:  Respiratory distress  HISTORY OF PRESENT ILLNESS:  65 year old woman with COPD on 3 L home oxygen and multiple sclerosis was brought in by [facility] emergency department for respiratory distress. EMS found her oxygen saturation to be 74% on arrival. She had called EMS twice in the same day. She was noted to have acute respiratory acidosis on ABG, failed BiPAP and continues nebs and was eventually intubated. She was then transferred to ICU at [facility]. No other history available since she is currently intubated and sedated.  Medication review shows high dose Xanax presumably for anxiety and narcotics. She is also on medications for dementia.  SUBJECTIVE:  RN reports no acute events.  Pt remains on propofol.  No wean yet this am.    VITAL SIGNS: BP (!) 162/80   Pulse 92   Temp 98.3 F (36.8 C) (Oral)   Resp (!) 28   Ht '5\' 3"'$  (1.6 m)   Wt 123 lb 7.3 oz (56 kg)   SpO2 94%   BMI 21.87 kg/m   HEMODYNAMICS:    VENTILATOR SETTINGS: Vent Mode: PRVC FiO2 (%):  [30 %-40 %] 35 % Set Rate:  [20 bmp] 20 bmp Vt Set:  [320 mL] 320 mL PEEP:  [5 cmH20] 5 cmH20 Pressure Support:  [10 cmH20] 10 cmH20 Plateau Pressure:  [19 cmH20-20 cmH20] 19 cmH20  INTAKE / OUTPUT: I/O last 3 completed shifts: In: 4092.7 [I.V.:2676.7; NG/GT:1416] Out: 5462 [VOJJK:0938]  PHYSICAL EXAMINATION: Gen. thin, acutely ill, oral [employer]. in no distress, normal affect ENT - no lesions, no post nasal drip Neck: No JVD, no thyromegaly, no carotid bruits Lungs:  Non-labored, lungs bilaterally with faint crackles bilateral lower, wheezing Cardiovascular: Rhythm regular, heart sounds  normal, no murmurs or gallops, no peripheral edema Abdomen: soft and non-tender, no hepatosplenomegaly, BS normal. Musculoskeletal: No deformities, no cyanosis or  clubbing Neuro:  awake, non focal   LABS:  BMET  Recent Labs Lab 09/06/16 0337 09/07/16 0312 09/08/16 0428  NA 139 139 142  K 3.4* 4.5 5.0  CL 97* 102 105  CO2 38* 31 33*  BUN 15 21* 23*  CREATININE 0.71 0.70 0.52  GLUCOSE 118* 127* 136*    Electrolytes  Recent Labs Lab 09/06/16 0337 09/07/16 0312 09/08/16 0428  CALCIUM 9.2 9.6 9.7  MG 1.7 1.9 2.0  PHOS 2.8 2.5 2.8    CBC  Recent Labs Lab 09/06/16 0337 09/07/16 0312 09/08/16 0428  WBC 5.7 8.0 9.2  HGB 9.6* 10.0* 10.4*  HCT 31.0* 33.0* 35.5*  PLT 120* 136* 161    Coag's No results for input(s): APTT, INR in the last 168 hours.  Sepsis Markers No results for input(s): LATICACIDVEN, PROCALCITON, O2SATVEN in the last 168 hours.  ABG  Recent Labs Lab 09/05/16 0944 09/05/16 1645 09/06/16 0354  PHART 7.529* 7.352 7.407  PCO2ART 43.5 67.2* 58.1*  PO2ART 127.0* 55.0* 87.0    Liver Enzymes  Recent Labs Lab 09/05/16 0033  AST 25  ALT 15  ALKPHOS 94  BILITOT 0.4  ALBUMIN 3.6    Cardiac Enzymes  Recent Labs Lab 09/05/16 0033  TROPONINI <0.03    Glucose  Recent Labs Lab 09/07/16 0850 09/07/16 1105 09/07/16 1610 09/07/16 1949 09/07/16 2344 09/08/16 0421  GLUCAP 129* 123* 107* 120* 128* 134*    Imaging Dg Chest [facility]  1 View  Result Date: 09/08/2016 CLINICAL DATA:  Acute respiratory failure.  ETT. EXAM: PORTABLE CHEST 1 VIEW COMPARISON:  September 07, 2016 FINDINGS: The ETT is in good position. The NG tube terminates below today's film. Small effusion and opacity is seen in the right base. No focal infiltrates. The cardiomediastinal silhouette is stable. IMPRESSION: 1. Stable support apparatus. 2. Stable small effusion and underlying opacity on the right. Electronically Signed   By: Dorise Bullion III M.D   On: 09/08/2016 07:11     STUDIES:    CULTURES: Flu 12/24 >> neg  ANTIBIOTICS: Levaquin 12/24 >>  SIGNIFICANT EVENTS: 12/24 Tx from [facility] ED  LINES/TUBES: ETT 12/24  >>  DISCUSSION: 65 y/o F with a PMH of COPD admitted with SOB, intubated for acute exacerbation of COPD  ASSESSMENT / PLAN:  PULMONARY A: Acute on Chronic Hypercarbic Respiratory Failure AECOPD P:   PRVC 8cc/kg, rate 20 Daily SBT / WUA Trend CXR Duoneb Q4 Solu-medrol Q8  CARDIOVASCULAR A:  Hypertension P:  Ct plavix PRN hydralazine for SBP >160 ICU monitoring of hemodynamics   RENAL A:   Hypokalemia /hypomag Oliguria P:   Reduce NS to 50/hr Trend BMP / UOP Replace electrolytes as indicated  GASTROINTESTINAL A:   Protein calorie malnutrition P:   TF per nutrition  NPO  PPI for SUP  HEMATOLOGIC A:   Anemia  Mild Thrombocytopenia  P:  Follow CBC Monitor for bleeding  Lovenox for DVT prophylaxis   INFECTIOUS A:   AECOPD  P:   Empiric Levaquin, D4/x Monitor fever curve / WBC  ENDOCRINE A:   Hyperglycemia  P:   Monitor CBG while on Solu-Medrol    NEUROLOGIC A:   Multiple sclerosis ? Dementia P:   RASS goal: 0 Propofol for sedation  PRN fentanyl / versed  Ct aricept PRN xanax Hold home oxycodone & monitor for withdrawal   FAMILY  - Updates: No family at bedside am 12/26.  Will update on arrival.   - Inter-disciplinary family meet or Palliative Care meeting due by:  day 7   CC Time: 30 minutes   Noe Gens, NP-C  Pulmonary & Critical Care Pgr: [phone] or if no answer [phone] 09/08/2016, 8:18 AM  Attending Note:  65 year old female with severe COPD presenting with respiratory failure requiring mechanical ventilation.  Attempted wean this AM and patient failed after a few minutes.  On exam, coarse BS diffusely.  I reviewed CXR myself, ETT in good position.  Will continue solumedrol.  Continue abx.  Keep dry as able.  Continue weaning efforts.  Change xanax to scheduled given level of anxiety and will follow up in AM with further weaning efforts.  Lasix 40 mg IV q8 x2 doses.  KVO IVF.    The patient is critically ill with  multiple organ systems failure and requires high complexity decision making for assessment and support, frequent evaluation and titration of therapies, application of advanced monitoring technologies and extensive interpretation of multiple databases.   Critical Care Time devoted to patient care services described in this note is  35  Minutes. This time reflects time of care of this signee Dr Jennet Maduro. This critical care time does not reflect procedure time, or teaching time or supervisory time of PA/NP/Med student/Med Resident etc but could involve care discussion time.  Rush Farmer, M.D. [facility] Pulmonary/Critical Care Medicine. Pager: [phone]. After hours pager: [phone].

## 2016-09-08 NOTE — Progress Notes (Signed)
   09/08/16 1522  Clinical Encounter Type  Visited With Patient  Visit Type Initial  Referral From Patient  Consult/Referral To Chaplain  Recommendations (followup as requested)  Spiritual Encounters  Spiritual Needs Prayer  Stress Factors  Patient Stress Factors Major life changes  Family Stress Factors Major life changes  Pt. Asleep, has acute dementia, no family available at the moment. Chaplain spoke with nurse, will check back or follow up on the patient and family.  [place] MA-PC , BA-REL/PHIL  [phone]

## 2016-09-09 ENCOUNTER — Inpatient Hospital Stay (HOSPITAL_COMMUNITY): Payer: Medicare HMO

## 2016-09-09 DIAGNOSIS — G9340 Encephalopathy, unspecified: Secondary | ICD-10-CM

## 2016-09-09 DIAGNOSIS — R0689 Other abnormalities of breathing: Secondary | ICD-10-CM

## 2016-09-09 DIAGNOSIS — J810 Acute pulmonary edema: Secondary | ICD-10-CM

## 2016-09-09 DIAGNOSIS — F411 Generalized anxiety disorder: Secondary | ICD-10-CM

## 2016-09-09 LAB — GLUCOSE, CAPILLARY
GLUCOSE-CAPILLARY: 111 mg/dL — AB (ref 65–99)
GLUCOSE-CAPILLARY: 122 mg/dL — AB (ref 65–99)
Glucose-Capillary: 131 mg/dL — ABNORMAL HIGH (ref 65–99)
Glucose-Capillary: 135 mg/dL — ABNORMAL HIGH (ref 65–99)
Glucose-Capillary: 122 mg/dL — ABNORMAL HIGH (ref 65–99)
Glucose-Capillary: 114 mg/dL — ABNORMAL HIGH (ref 65–99)

## 2016-09-09 LAB — CBC
HCT: 39.9 % (ref 36.0–46.0)
HEMOGLOBIN: 11.6 g/dL — AB (ref 12.0–15.0)
MCH: 26 pg (ref 26.0–34.0)
MCHC: 29.1 g/dL — ABNORMAL LOW (ref 30.0–36.0)
MCV: 89.3 fL (ref 78.0–100.0)
Platelets: 182 10*3/uL (ref 150–400)
RBC: 4.47 MIL/uL (ref 3.87–5.11)
RDW: 15 % (ref 11.5–15.5)
WBC: 11.2 10*3/uL — ABNORMAL HIGH (ref 4.0–10.5)

## 2016-09-09 LAB — MAGNESIUM: MAGNESIUM: 1.8 mg/dL (ref 1.7–2.4)

## 2016-09-09 LAB — BASIC METABOLIC PANEL
ANION GAP: 7 (ref 5–15)
BUN: 24 mg/dL — ABNORMAL HIGH (ref 6–20)
CALCIUM: 10 mg/dL (ref 8.9–10.3)
CO2: 39 mmol/L — ABNORMAL HIGH (ref 22–32)
Chloride: 93 mmol/L — ABNORMAL LOW (ref 101–111)
Creatinine, Ser: 0.58 mg/dL (ref 0.44–1.00)
Glucose, Bld: 146 mg/dL — ABNORMAL HIGH (ref 65–99)
POTASSIUM: 4.4 mmol/L (ref 3.5–5.1)
Sodium: 139 mmol/L (ref 135–145)

## 2016-09-09 LAB — PHOSPHORUS: PHOSPHORUS: 2.4 mg/dL — AB (ref 2.5–4.6)

## 2016-09-09 MED ORDER — MAGNESIUM HYDROXIDE 400 MG/5ML PO SUSP
15.0000 mL | Freq: Once | ORAL | Status: AC
  Administered 2016-09-09: 15 mL
  Filled 2016-09-09: qty 30

## 2016-09-09 MED ORDER — MAGNESIUM SULFATE 2 GM/50ML IV SOLN
2.0000 g | Freq: Once | INTRAVENOUS | Status: AC
  Administered 2016-09-09: 2 g via INTRAVENOUS
  Filled 2016-09-09: qty 50

## 2016-09-09 MED ORDER — FUROSEMIDE 10 MG/ML IJ SOLN
40.0000 mg | Freq: Four times a day (QID) | INTRAMUSCULAR | Status: AC
  Administered 2016-09-09 – 2016-09-10 (×3): 40 mg via INTRAVENOUS
  Filled 2016-09-09 (×3): qty 4

## 2016-09-09 MED ORDER — DOCUSATE SODIUM 50 MG/5ML PO LIQD
100.0000 mg | Freq: Two times a day (BID) | ORAL | Status: AC
  Administered 2016-09-09 – 2016-09-10 (×3): 100 mg
  Filled 2016-09-09 (×3): qty 10

## 2016-09-09 NOTE — Progress Notes (Signed)
PULMONARY / CRITICAL CARE MEDICINE   Name: RHANDA LEMIRE MRN: [id number] DOB: 12-13-1950    ADMISSION DATE:  09/05/2016  REFERRING MD:  EDP  CHIEF COMPLAINT:  Respiratory distress  HISTORY OF PRESENT ILLNESS:  65 year old woman with COPD on 3 L home oxygen and multiple sclerosis was brought in by [facility] emergency department for respiratory distress. EMS found her oxygen saturation to be 74% on arrival. She had called EMS twice in the same day. She was noted to have acute respiratory acidosis on ABG, failed BiPAP and continues nebs and was eventually intubated. She was then transferred to ICU at [facility]. No other history available since she is currently intubated and sedated.  Medication review shows high dose Xanax presumably for anxiety and narcotics. She is also on medications for dementia.  SUBJECTIVE:  RN reports no acute events.  Pt remains on propofol.  No wean yet this am.  Will wean as tolerated 12/28  VITAL SIGNS: BP (!) 107/55   Pulse 96   Temp 99.1 F (37.3 C) (Oral)   Resp 20   Ht '5\' 3"'$  (1.6 m)   Wt 116 lb 13.5 oz (53 kg)   SpO2 96%   BMI 20.70 kg/m   HEMODYNAMICS:    VENTILATOR SETTINGS: Vent Mode: PSV;CPAP FiO2 (%):  [35 %-40 %] 40 % Set Rate:  [20 bmp] 20 bmp Vt Set:  [320 mL] 320 mL PEEP:  [5 cmH20] 5 cmH20 Pressure Support:  [5 cmH20] 5 cmH20 Plateau Pressure:  [6 cmH20-23 cmH20] 6 cmH20  INTAKE / OUTPUT: I/O last 3 completed shifts: In: 2953.7 [I.V.:1373.7; NG/GT:1580] Out: 4075 [Urine:4075]  PHYSICAL EXAMINATION: Gen. thin, acutely ill, oral E[employer]. in no distress, normal affect Neck: No JVD, no thyromegaly, no carotid bruits Lungs:  Non-labored, lungs bilaterally with faint crackles bilateral lower, wheezing Cardiovascular: Rhythm regular, heart sounds  normal, no murmurs or gallops, no peripheral edema Abdomen: soft and non-tender, no hepatosplenomegaly, BS normal. Musculoskeletal: No deformities, no cyanosis or clubbing Neuro:   awake, non focal   LABS:  BMET  Recent Labs Lab 09/07/16 0312 09/08/16 0428 09/09/16 0352  NA 139 142 139  K 4.5 5.0 4.4  CL 102 105 93*  CO2 31 33* 39*  BUN 21* 23* 24*  CREATININE 0.70 0.52 0.58  GLUCOSE 127* 136* 146*    Electrolytes  Recent Labs Lab 09/07/16 0312 09/08/16 0428 09/09/16 0352  CALCIUM 9.6 9.7 10.0  MG 1.9 2.0 1.8  PHOS 2.5 2.8 2.4*    CBC  Recent Labs Lab 09/07/16 0312 09/08/16 0428 09/09/16 0352  WBC 8.0 9.2 11.2*  HGB 10.0* 10.4* 11.6*  HCT 33.0* 35.5* 39.9  PLT 136* 161 182    Coag's No results for input(s): APTT, INR in the last 168 hours.  Sepsis Markers No results for input(s): LATICACIDVEN, PROCALCITON, O2SATVEN in the last 168 hours.  ABG  Recent Labs Lab 09/05/16 0944 09/05/16 1645 09/06/16 0354  PHART 7.529* 7.352 7.407  PCO2ART 43.5 67.2* 58.1*  PO2ART 127.0* 55.0* 87.0    Liver Enzymes  Recent Labs Lab 09/05/16 0033  AST 25  ALT 15  ALKPHOS 94  BILITOT 0.4  ALBUMIN 3.6    Cardiac Enzymes  Recent Labs Lab 09/05/16 0033  TROPONINI <0.03    Glucose  Recent Labs Lab 09/08/16 1215 09/08/16 1555 09/08/16 2105 09/09/16 0118 09/09/16 0410 09/09/16 0750  GLUCAP 102* 118* 98 111* 131* 135*    Imaging Dg Chest Port 1 View  Result Date:  09/09/2016 CLINICAL DATA:  Hypoxia EXAM: PORTABLE CHEST 1 VIEW COMPARISON:  September 08, 2016 FINDINGS: Endotracheal tube tip is 3.1 cm above the carina. Nasogastric tube tip and side port are below the diaphragm. No pneumothorax. There is a persistent small right pleural effusion with right base atelectasis. No edema or consolidation. Heart size and pulmonary vascularity are stable and within normal limits. There is atherosclerotic calcification in the aorta. No bone lesions evident. No adenopathy. IMPRESSION: Tube and catheter positions as described without pneumothorax. Persistent right pleural effusion with right base atelectasis. No new opacity. Stable cardiac  silhouette. There is aortic atherosclerosis. Electronically Signed   By: Lowella Grip III M.D.   On: 09/09/2016 07:17     STUDIES:    CULTURES: Flu 12/24 >> neg  ANTIBIOTICS: Levaquin 12/24 >>  SIGNIFICANT EVENTS: 12/24 Tx from [facility] ED  LINES/TUBES: ETT 12/24 >>  DISCUSSION: 65 y/o F with a PMH of COPD admitted with SOB, intubated for acute exacerbation of COPD  Intake/Output Summary (Last 24 hours) at 09/09/16 1001 Last data filed at 09/09/16 0800  Gross per 24 hour  Intake           1051.6 ml  Output             3200 ml  Net          -2148.4 ml    ASSESSMENT / PLAN:  PULMONARY A: Acute on Chronic Hypercarbic Respiratory Failure AECOPD P:   PRVC 8cc/kg, rate 20 Daily SBT / WUA Trend CXR Duoneb Q4 Solu-medrol Q8 Diuresis 12/28 with negative 2.1 litre  CARDIOVASCULAR A:  Hypertension P:  Ct plavix PRN hydralazine for SBP >160 ICU monitoring of hemodynamics  12/28 diuresis   RENAL  Recent Labs Lab 09/07/16 0312 09/08/16 0428 09/09/16 0352  K 4.5 5.0 4.4     A:   Hypokalemia /hypomag Oliguria P:   Reduce NS to 50/hr Trend BMP / UOP Replace electrolytes as indicated  GASTROINTESTINAL A:   Protein calorie malnutrition P:   TF per nutrition  NPO  PPI for SUP  HEMATOLOGIC A:   Anemia  Mild Thrombocytopenia resolved P:  Follow CBC Monitor for bleeding  Lovenox for DVT prophylaxis   INFECTIOUS A:   AECOPD  P:   Empiric Levaquin, D4/x Monitor fever curve / WBC  ENDOCRINE CBG (last 3)   Recent Labs  09/09/16 0118 09/09/16 0410 09/09/16 0750  GLUCAP 111* 131* 135*     A:   Hyperglycemia  P:   Monitor CBG while on Solu-Medrol    NEUROLOGIC A:   Multiple sclerosis ? Dementia P:   RASS goal: 0 Propofol for sedation  PRN fentanyl / versed  Ct aricept PRN xanax Hold home oxycodone & monitor for withdrawal Lighten sedation 12/28   FAMILY  - Updates: No family at bedside am 12/28.  Will update on arrival.    - Inter-disciplinary family meet or Palliative Care meeting due by:  day 7   CC Time: 30 minutes   Richardson Landry Minor ACNP Maryanna Shape PCCM Pager [phone] till 3 pm If no answer page [phone] 09/09/2016, 9:58 AM  Attending Note:  65 year old female with severe COPD presenting with respiratory failure requiring mechanical ventilation.  Attempted wean this AM and patient failed after a few minutes, I suspect.  On exam, coarse BS diffusely.  I reviewed CXR myself, ETT in good position.  Will continue solumedrol.  Continue abx.  Lasix but increase to 40 mg IV q6 x3 doses.  Continue weaning efforts.  Change xanax to scheduled given level of anxiety and will follow up in AM with further weaning efforts.  KVO IVF.  Evidently there were some concerns regarding family communications, Soil scientist to schedule meeting with family in AM to clarify plan of care at 10 AM.  The patient is critically ill with multiple organ systems failure and requires high complexity decision making for assessment and support, frequent evaluation and titration of therapies, application of advanced monitoring technologies and extensive interpretation of multiple databases.   Critical Care Time devoted to patient care services described in this note is  35  Minutes. This time reflects time of care of this signee Dr Jennet Maduro. This critical care time does not reflect procedure time, or teaching time or supervisory time of PA/NP/Med student/Med Resident etc but could involve care discussion time.  Rush Farmer, M.D. [facility] Pulmonary/Critical Care Medicine. Pager: [phone]. After hours pager: [phone].

## 2016-09-09 NOTE — Care Management Important Message (Signed)
Important Message  Patient Details  Name: Olivia Randall MRN: [id number] Date of Birth: 1950-10-02   Medicare Important Message Given:  Yes    Nathen May 09/09/2016, 10:46 AM

## 2016-09-09 NOTE — Plan of Care (Signed)
Problem: Coping: Goal: Level of anxiety will decrease Outcome: Progressing Pt able to wean today with decrease sedation and anxiety

## 2016-09-09 NOTE — Progress Notes (Signed)
Pt has been weaning since 0812. She follows commands and is awake. The Pt has some anxiety but is doing good on wean. RT will continue to monitor

## 2016-09-10 ENCOUNTER — Inpatient Hospital Stay (HOSPITAL_COMMUNITY): Payer: Medicare HMO

## 2016-09-10 DIAGNOSIS — Z7189 Other specified counseling: Secondary | ICD-10-CM

## 2016-09-10 LAB — BASIC METABOLIC PANEL
ANION GAP: 9 (ref 5–15)
BUN: 23 mg/dL — AB (ref 6–20)
CALCIUM: 9.8 mg/dL (ref 8.9–10.3)
CO2: 48 mmol/L — ABNORMAL HIGH (ref 22–32)
Chloride: 82 mmol/L — ABNORMAL LOW (ref 101–111)
Creatinine, Ser: 0.6 mg/dL (ref 0.44–1.00)
GFR calc Af Amer: >60 mL/min (ref >60)
GLUCOSE: 137 mg/dL — AB (ref 65–99)
Potassium: 4.4 mmol/L (ref 3.5–5.1)
Sodium: 139 mmol/L (ref 135–145)

## 2016-09-10 LAB — BLOOD GAS, ARTERIAL
ACID-BASE EXCESS: 28.2 mmol/L — AB (ref 0.0–2.0)
Bicarbonate: 54.8 mmol/L — ABNORMAL HIGH (ref 20.0–28.0)
DRAWN BY: 404151
FIO2: 40
LHR: 20 {breaths}/min
O2 SAT: 96.2 %
PCO2 ART: 79.2 mmHg — AB (ref 32.0–48.0)
PEEP: 5 cmH2O
Patient temperature: 98.6
VT: 320 mL
pH, Arterial: 7.455 — ABNORMAL HIGH (ref 7.350–7.450)
pO2, Arterial: 81.7 mmHg — ABNORMAL LOW (ref 83.0–108.0)

## 2016-09-10 LAB — CBC
HCT: 42.1 % (ref 36.0–46.0)
HEMOGLOBIN: 12.3 g/dL (ref 12.0–15.0)
MCH: 26.1 pg (ref 26.0–34.0)
MCHC: 29.2 g/dL — ABNORMAL LOW (ref 30.0–36.0)
MCV: 89.2 fL (ref 78.0–100.0)
Platelets: 217 10*3/uL (ref 150–400)
RBC: 4.72 MIL/uL (ref 3.87–5.11)
RDW: 15.1 % (ref 11.5–15.5)
WBC: 13.8 10*3/uL — ABNORMAL HIGH (ref 4.0–10.5)

## 2016-09-10 LAB — GLUCOSE, CAPILLARY
GLUCOSE-CAPILLARY: 138 mg/dL — AB (ref 65–99)
GLUCOSE-CAPILLARY: 131 mg/dL — AB (ref 65–99)
GLUCOSE-CAPILLARY: 104 mg/dL — AB (ref 65–99)
GLUCOSE-CAPILLARY: 119 mg/dL — AB (ref 65–99)
Glucose-Capillary: 119 mg/dL — ABNORMAL HIGH (ref 65–99)
Glucose-Capillary: 91 mg/dL (ref 65–99)
Glucose-Capillary: 92 mg/dL (ref 65–99)

## 2016-09-10 LAB — PHOSPHORUS: PHOSPHORUS: 2.5 mg/dL (ref 2.5–4.6)

## 2016-09-10 LAB — MAGNESIUM: Magnesium: 2.1 mg/dL (ref 1.7–2.4)

## 2016-09-10 MED ORDER — ORAL CARE MOUTH RINSE
15.0000 mL | Freq: Two times a day (BID) | OROMUCOSAL | Status: DC
  Administered 2016-09-11 – 2016-09-13 (×4): 15 mL via OROMUCOSAL

## 2016-09-10 MED ORDER — FUROSEMIDE 10 MG/ML IJ SOLN
40.0000 mg | Freq: Once | INTRAMUSCULAR | Status: AC
Start: 2016-09-10 — End: 2016-09-10
  Administered 2016-09-10: 40 mg via INTRAVENOUS
  Filled 2016-09-10: qty 4

## 2016-09-10 NOTE — Plan of Care (Signed)
Problem: Respiratory: Goal: Ability to maintain a clear airway and adequate ventilation will improve Outcome: Progressing Pt extubated to home 3L [place].

## 2016-09-10 NOTE — Progress Notes (Signed)
Pt extubated per doctor. Passed leak test, followed direction. Pt makes effort to cough and to speak. Incentive was started. Pt wears 3 L [place] at home. Pt extubated to 3 L [place]. Pt is a retainer, RT will continue to monitor

## 2016-09-10 NOTE — Significant Event (Signed)
Patient actively reaching and pulling on her ETT at this time, even with RN in the room. Patient is reoriented and educated. Will notify team and renew restraints.

## 2016-09-10 NOTE — Plan of Care (Signed)
Problem: Activity: Goal: Ability to tolerate increased activity will improve Outcome: Progressing Patient tolerating up out of bed to chair and to bathroom without distress or difficulty.

## 2016-09-10 NOTE — Plan of Care (Signed)
Problem: Nutritional: Goal: Intake of prescribed amount of daily calories will improve Outcome: Progressing Patient was on tube feeds prior to extubation. Diet progressing as tolerated now that patient is extubated.

## 2016-09-10 NOTE — Progress Notes (Addendum)
PULMONARY / CRITICAL CARE MEDICINE   Name: Olivia Randall MRN: [id number] DOB: Nov 23, 1950    ADMISSION DATE:  09/05/2016  REFERRING MD:  EDP  CHIEF COMPLAINT:  Respiratory distress  BRIEF SUMMARY:  65 year old woman with COPD on 3 L home oxygen and multiple sclerosis was brought in by [facility] emergency department for respiratory distress. EMS found her oxygen saturation to be 74% on arrival. She had called EMS twice in the same day. She was noted to have acute respiratory acidosis on ABG, failed BiPAP and continues nebs and was eventually intubated. She was then transferred to ICU at [facility].  She is on high dose xanax and narcotics at home. Also, dementia medications.   SUBJECTIVE:   Weaning on PSV 5/5 - Vt 300-350.  Periods of intermittent agitation.  Net neg 3L.    VITAL SIGNS: BP 123/68   Pulse (!) 107   Temp 99.2 F (37.3 C) (Oral)   Resp (!) 26   Ht _0  (1.6 m)   Wt 109 lb 9.1 oz (49.7 kg)   SpO2 95%   BMI 19.41 kg/m   HEMODYNAMICS:    VENTILATOR SETTINGS: Vent Mode: PSV;CPAP FiO2 (%):  [30 %-40 %] 30 % Set Rate:  [20 bmp] 20 bmp Vt Set:  [320 mL] 320 mL PEEP:  [5 cmH20] 5 cmH20 Pressure Support:  [5 cmH20-10 cmH20] 5 cmH20 Plateau Pressure:  [14 cmH20-17 cmH20] 14 cmH20  INTAKE / OUTPUT: I/O last 3 completed shifts: In: 1876.7 [I.V.:106.7; Other:90; NG/GT:1630; IV Piggyback:50] Out: 2353 [Urine:5950]  PHYSICAL EXAMINATION: Gen. thin, acutely ill, oral ETT PSY. in no distress, normal affect Neck: No JVD, no thyromegaly, no carotid bruits Lungs:  Non-labored, lungs bilaterally with faint crackles bilateral lower, wheezing Cardiovascular: Rhythm regular, heart sounds  normal, no murmurs or gallops, no peripheral edema Abdomen: soft and non-tender, no hepatosplenomegaly, BS normal. Musculoskeletal: No deformities, no cyanosis or clubbing Neuro:  awake, non focal   LABS:  BMET  Recent Labs Lab 09/08/16 0428 09/09/16 0352 09/10/16 0333  NA 142  139 139  K 5.0 4.4 4.4  CL 105 93* 82*  CO2 33* 39* 48*  BUN 23* 24* 23*  CREATININE 0.52 0.58 0.60  GLUCOSE 136* 146* 137*    Electrolytes  Recent Labs Lab 09/08/16 0428 09/09/16 0352 09/10/16 0333  CALCIUM 9.7 10.0 9.8  MG 2.0 1.8 2.1  PHOS 2.8 2.4* 2.5    CBC  Recent Labs Lab 09/08/16 0428 09/09/16 0352 09/10/16 0333  WBC 9.2 11.2* 13.8*  HGB 10.4* 11.6* 12.3  HCT 35.5* 39.9 42.1  PLT 161 182 217    Coag's No results for input(s): APTT, INR in the last 168 hours.  Sepsis Markers No results for input(s): LATICACIDVEN, PROCALCITON, O2SATVEN in the last 168 hours.  ABG  Recent Labs Lab 09/05/16 1645 09/06/16 0354 09/10/16 0359  PHART 7.352 7.407 7.455*  PCO2ART 67.2* 58.1* 79.2*  PO2ART 55.0* 87.0 81.7*    Liver Enzymes  Recent Labs Lab 09/05/16 0033  AST 25  ALT 15  ALKPHOS 94  BILITOT 0.4  ALBUMIN 3.6    Cardiac Enzymes  Recent Labs Lab 09/05/16 0033  TROPONINI <0.03    Glucose  Recent Labs Lab 09/09/16 0750 09/09/16 1121 09/09/16 1732 09/09/16 1938 09/09/16 2337 09/10/16 0359  GLUCAP 135* 122* 114* 122* 119* 138*    Imaging Dg Chest Port 1 View  Result Date: 09/10/2016 CLINICAL DATA:  Followup for intubated patient. Respiratory failure. EXAM: PORTABLE CHEST 1 VIEW  COMPARISON:  09/09/2016 FINDINGS: Cardiac silhouette is normal in size. No mediastinal or hilar masses. Lungs are hyperexpanded. There is chronic pleural thickening at the right lung base blunting the hemidiaphragm, versus a chronic small pleural effusion. No left pleural effusion. No pneumothorax. No evidence of pneumonia or pulmonary edema. Endotracheal tube and nasal/ orogastric tube are stable and well positioned. IMPRESSION: 1. No acute cardiopulmonary disease. 2. COPD. 3. Well-positioned support apparatus. Electronically Signed   By: Lajean Manes M.D.   On: 09/10/2016 08:11     STUDIES:    CULTURES: Flu 12/24 >> neg  ANTIBIOTICS: Levaquin 12/24  >>  SIGNIFICANT EVENTS: 12/24  Tx from [facility] ED 12/29  Weaning on PSV 5/5  LINES/TUBES: ETT 12/24 >>    ASSESSMENT / PLAN:  DISCUSSION: 65 y/o F with a PMH of COPD admitted with SOB, intubated for acute exacerbation of COPD   PULMONARY A: Acute on Chronic Hypercarbic Respiratory Failure AECOPD P:   PRVC 8cc/kg  Daily SBT / WUA   Hopeful for extubation 12/29 Trend CXR Duoneb Q4 Solu-medrol Q8 Diuresis as renal function / BP permit  CARDIOVASCULAR A:  Hypertension P:  Ct plavix PRN hydralazine for SBP >160 ICU monitoring of hemodynamics   RENAL A:   Hypokalemia /hypomag Oliguria P:   KVO IVF Trend BMP / UOP Replace electrolytes as indicated Lasix 40 mg IV x1  GASTROINTESTINAL A:   Protein calorie malnutrition P:   TF per nutrition  NPO  PPI for SUP  HEMATOLOGIC A:   Anemia  Mild Thrombocytopenia - resolved P:  Follow CBC Monitor for bleeding  Lovenox for DVT prophylaxis   INFECTIOUS A:   AECOPD  P:   Empiric Levaquin, D6/7 Monitor fever curve / WBC  ENDOCRINE A:   Hyperglycemia  P:   Monitor CBG while on Solu-Medrol    NEUROLOGIC A:   Multiple sclerosis ? Dementia P:   RASS goal: 0 Propofol for sedation  PRN fentanyl / versed  Ct aricept PRN xanax Hold home oxycodone & monitor for withdrawal   FAMILY  - Updates: No family at bedside am 12/29.     - Inter-disciplinary family meet or Palliative Care meeting due by:  day 7   CC Time:  30 minutes   Noe Gens, NP-C [facility] Pgr: [phone] or if no answer [phone] 09/10/2016, 9:12 AM  Attending Note:  65 year old female with COPD, smoker, presenting with a COPD exacerbation.  Patient was intubated for hypercarbic respiratory failure, improved with diuresing and now weaning.  On exam, distant breathsounds but otherwise clear.  I reviewed CXR myself, ETT ok.  Discussed with PCCM-NP.  Will extubate today after SBT, hold further diureses for now,  family meeting arranged today for plan of care.  Full code status.  Continue IV steroids for now and abx (levofloxacin) for total of 8 days.  Hold in ICU post extubation and likely transfer out in AM.  Met with patient and family, after discussion decision was made about LCB with no CPR/cardioversion/trach/peg.  Short term intubation only.  Will change code status.  The patient is critically ill with multiple organ systems failure and requires high complexity decision making for assessment and support, frequent evaluation and titration of therapies, application of advanced monitoring technologies and extensive interpretation of multiple databases.   Critical Care Time devoted to patient care services described in this note is  45  Minutes. This time reflects time of care of this signee Dr Jennet Maduro.  This critical care time does not reflect procedure time, or teaching time or supervisory time of PA/NP/Med student/Med Resident etc but could involve care discussion time.  Rush Farmer, M.D. [facility] Pulmonary/Critical Care Medicine. Pager: [phone]. After hours pager: [phone].

## 2016-09-10 NOTE — Procedures (Signed)
Extubation Procedure Note  Patient Details:   Name: Olivia Randall DOB: 06-Nov-1950 MRN: [id number]   Airway Documentation:     Evaluation  O2 sats: stable throughout Complications: No apparent complications Patient did tolerate procedure well. Bilateral Breath Sounds: Diminished, Expiratory wheezes   Yes  Elsie Stain 09/10/2016, 11:05 AM

## 2016-09-11 ENCOUNTER — Inpatient Hospital Stay (HOSPITAL_COMMUNITY): Payer: Medicare HMO

## 2016-09-11 LAB — BASIC METABOLIC PANEL
Anion gap: 12 (ref 5–15)
BUN: 24 mg/dL — AB (ref 6–20)
CALCIUM: 10.2 mg/dL (ref 8.9–10.3)
CO2: 40 mmol/L — AB (ref 22–32)
Chloride: 85 mmol/L — ABNORMAL LOW (ref 101–111)
Creatinine, Ser: 0.61 mg/dL (ref 0.44–1.00)
GFR calc Af Amer: >60 mL/min (ref >60)
GLUCOSE: 103 mg/dL — AB (ref 65–99)
Potassium: 4.7 mmol/L (ref 3.5–5.1)
Sodium: 137 mmol/L (ref 135–145)

## 2016-09-11 LAB — CBC
HCT: 41.6 % (ref 36.0–46.0)
HEMOGLOBIN: 12.6 g/dL (ref 12.0–15.0)
MCH: 26.3 pg (ref 26.0–34.0)
MCHC: 30.3 g/dL (ref 30.0–36.0)
MCV: 86.8 fL (ref 78.0–100.0)
Platelets: 227 10*3/uL (ref 150–400)
RBC: 4.79 MIL/uL (ref 3.87–5.11)
RDW: 14.7 % (ref 11.5–15.5)
WBC: 13.8 10*3/uL — ABNORMAL HIGH (ref 4.0–10.5)

## 2016-09-11 LAB — GLUCOSE, CAPILLARY
GLUCOSE-CAPILLARY: 83 mg/dL (ref 65–99)
GLUCOSE-CAPILLARY: 82 mg/dL (ref 65–99)
Glucose-Capillary: 103 mg/dL — ABNORMAL HIGH (ref 65–99)

## 2016-09-11 LAB — MAGNESIUM: MAGNESIUM: 1.9 mg/dL (ref 1.7–2.4)

## 2016-09-11 LAB — TRIGLYCERIDES: TRIGLYCERIDES: 72 mg/dL (ref <150)

## 2016-09-11 LAB — PHOSPHORUS: PHOSPHORUS: 3.2 mg/dL (ref 2.5–4.6)

## 2016-09-11 MED ORDER — BUDESONIDE 0.5 MG/2ML IN SUSP
0.5000 mg | Freq: Two times a day (BID) | RESPIRATORY_TRACT | Status: DC
  Administered 2016-09-11 – 2016-09-14 (×7): 0.5 mg via RESPIRATORY_TRACT
  Filled 2016-09-11 (×7): qty 2

## 2016-09-11 MED ORDER — ARFORMOTEROL TARTRATE 15 MCG/2ML IN NEBU
15.0000 ug | INHALATION_SOLUTION | Freq: Two times a day (BID) | RESPIRATORY_TRACT | Status: DC
  Administered 2016-09-11 – 2016-09-14 (×7): 15 ug via RESPIRATORY_TRACT
  Filled 2016-09-11 (×7): qty 2

## 2016-09-11 MED ORDER — IPRATROPIUM-ALBUTEROL 0.5-2.5 (3) MG/3ML IN SOLN
3.0000 mL | Freq: Three times a day (TID) | RESPIRATORY_TRACT | Status: DC
  Administered 2016-09-12 – 2016-09-14 (×7): 3 mL via RESPIRATORY_TRACT
  Filled 2016-09-11 (×7): qty 3

## 2016-09-11 MED ORDER — IPRATROPIUM-ALBUTEROL 0.5-2.5 (3) MG/3ML IN SOLN
3.0000 mL | Freq: Four times a day (QID) | RESPIRATORY_TRACT | Status: DC
  Administered 2016-09-11 (×2): 3 mL via RESPIRATORY_TRACT
  Filled 2016-09-11 (×2): qty 3

## 2016-09-11 MED ORDER — METHYLPREDNISOLONE SODIUM SUCC 125 MG IJ SOLR
40.0000 mg | INTRAMUSCULAR | Status: DC
  Administered 2016-09-12: 40 mg via INTRAVENOUS
  Filled 2016-09-11: qty 2

## 2016-09-11 NOTE — Progress Notes (Signed)
PULMONARY / CRITICAL CARE MEDICINE   Name: Olivia Randall MRN: [id number] DOB: September 02, 1951    ADMISSION DATE:  09/05/2016  REFERRING MD:  EDP  CHIEF COMPLAINT:  Respiratory distress  BRIEF SUMMARY:  65 year old woman with COPD on 3 L home oxygen and multiple sclerosis was brought in by [facility] emergency department for respiratory distress. EMS found her oxygen saturation to be 74% on arrival. She had called EMS twice in the same day. She was noted to have acute respiratory acidosis on ABG, failed BiPAP and continues nebs and was eventually intubated. She was then transferred to ICU at [facility].  She is on high dose xanax and narcotics at home. Also, dementia medications.   SUBJECTIVE:    Extubated 12/29, doing well  VITAL SIGNS: BP 127/71 (BP Location: Left Arm)   Pulse 95   Temp 98.4 F (36.9 C) (Oral)   Resp (!) 21   Ht '5\' 3"'$  (1.6 m)   Wt 107 lb 5.8 oz (48.7 kg)   SpO2 99%   BMI 19.02 kg/m   HEMODYNAMICS:    VENTILATOR SETTINGS:    INTAKE / OUTPUT: I/O last 3 completed shifts: In: 813.8 [I.V.:47.1; Other:30; NG/GT:736.7] Out: 5310 [Urine:5310]  PHYSICAL EXAMINATION: Gen. No acute distress HENT: NCAT ETT in place PULM: diminished breath sounds, poor air movement CV: RRR, no mgr GI: BS+, soft, nontender MSK: normal bulk and tone Neuro: awake, alert, no distress   LABS:  BMET  Recent Labs Lab 09/09/16 0352 09/10/16 0333 09/11/16 0423  NA 139 139 137  K 4.4 4.4 4.7  CL 93* 82* 85*  CO2 39* 48* 40*  BUN 24* 23* 24*  CREATININE 0.58 0.60 0.61  GLUCOSE 146* 137* 103*    Electrolytes  Recent Labs Lab 09/09/16 0352 09/10/16 0333 09/11/16 0423  CALCIUM 10.0 9.8 10.2  MG 1.8 2.1 1.9  PHOS 2.4* 2.5 3.2    CBC  Recent Labs Lab 09/09/16 0352 09/10/16 0333 09/11/16 0423  WBC 11.2* 13.8* 13.8*  HGB 11.6* 12.3 12.6  HCT 39.9 42.1 41.6  PLT 182 217 227    Coag's No results for input(s): APTT, INR in the last 168 hours.  Sepsis  Markers No results for input(s): LATICACIDVEN, PROCALCITON, O2SATVEN in the last 168 hours.  ABG  Recent Labs Lab 09/05/16 1645 09/06/16 0354 09/10/16 0359  PHART 7.352 7.407 7.455*  PCO2ART 67.2* 58.1* 79.2*  PO2ART 55.0* 87.0 81.7*    Liver Enzymes  Recent Labs Lab 09/05/16 0033  AST 25  ALT 15  ALKPHOS 94  BILITOT 0.4  ALBUMIN 3.6    Cardiac Enzymes  Recent Labs Lab 09/05/16 0033  TROPONINI <0.03    Glucose  Recent Labs Lab 09/10/16 1220 09/10/16 1607 09/10/16 2043 09/10/16 2349 09/11/16 0426 09/11/16 0731  GLUCAP 104* 92 119* 91 103* 83    Imaging Dg Chest Port 1 View  Result Date: 09/11/2016 CLINICAL DATA:  acute respiratory failure, hypoxia EXAM: PORTABLE CHEST 1 VIEW COMPARISON:  09/10/2016 FINDINGS: There is hyperinflation of the lungs compatible with COPD. Interval extubation. No confluent airspace opacities. Blunting of the right costophrenic angle may reflect a small right effusion. Heart is normal size. No acute bony abnormality. IMPRESSION: COPD.  Possible small right effusion. Electronically Signed   By: Rolm Baptise M.D.   On: 09/11/2016 08:22     STUDIES:   CULTURES: Flu 12/24 >> neg  ANTIBIOTICS: Levaquin 12/24 >> 12/30  SIGNIFICANT EVENTS: 12/24  Tx from [facility] 12/29  Weaning  on PSV 5/5  LINES/TUBES: ETT 12/24 >>    ASSESSMENT / PLAN:  DISCUSSION: 65 y/o F with a PMH of COPD admitted with SOB, intubated for acute exacerbation of COPD. Extubated on 12/29.   PULMONARY A: Acute on Chronic Hypercarbic Respiratory Failure > resolved AECOPD > improved P:   Add brovana/pulmicort Wean solumedrol to '40mg'$  IV daily Decrease frequency of duoneb Continue O2 saturation, monitor to 88-92% Hold theophylline  CARDIOVASCULAR A:  Hypertension P:  Ct plavix PRN hydralazine for SBP >160  RENAL A:   No acute issues P:   Monitor BMET and UOP Replace electrolytes as needed   GASTROINTESTINAL A:   Protein calorie  malnutrition P:   D/c tube feedings Advance diet Bedside RN swallow eval  HEMATOLOGIC A:   Anemia  Mild Thrombocytopenia - resolved P:  Follow CBC Monitor for bleeding  Lovenox for DVT prophylaxis   INFECTIOUS A:   AECOPD  P:    D/C Levaquin Monitor fever curve / WBC  ENDOCRINE A:   Hyperglycemia  P:   SSI  NEUROLOGIC A:   Multiple sclerosis ? Dementia Anxiety, benzo dependent P:   Continue aricept PRN xanax Hold home oxycodone & monitor for withdrawal   FAMILY  - Updates: No family at bedside am 12/30.     - Inter-disciplinary family meet or Palliative Care meeting due by:  day 7   Transfer to Med surg, TRH service  Roselie Awkward, MD [place] [facility] Pager: [phone] Cell: [phone] After 3pm or if no response, call [phone]

## 2016-09-11 NOTE — Evaluation (Signed)
Physical Therapy Evaluation Patient Details Name: Olivia Randall MRN: [id number] DOB: 1951/05/24 Today's Date: 09/11/2016   History of Present Illness  65 year old woman with COPD on 3 L home oxygen and multiple sclerosis was brought in by [facility] emergency department for respiratory distress. EMS found her oxygen saturation to be 74% on arrival. She had called EMS twice in the same day. She was noted to have acute respiratory acidosis on ABG, failed BiPAP and continues nebs and was eventually intubated. She was then transferred to ICU at [facility].  Clinical Impression  Pt extremely limited by SOB with all activity. Pt only able to ambulate in 5 feet increments due to SOB, however pt SpO2>94% on 3LO2 via [place]. Suspect pt lead sedentary life before and is near baseline. Pt reports having 24/7 assist. PT to follow.    Follow Up Recommendations Home health PT;Supervision/Assistance - 24 hour    Equipment Recommendations  Rolling walker with 5" wheels    Recommendations for Other Services       Precautions / Restrictions Precautions Precautions: Fall Precaution Comments: 3Lo2 via [place] and extremely SOB Restrictions Weight Bearing Restrictions: No      Mobility  Bed Mobility Overal bed mobility: Needs Assistance Bed Mobility: Supine to Sit     Supine to sit: Supervision     General bed mobility comments: increased time but no phsyical assist  Transfers Overall transfer level: Needs assistance Equipment used: None Transfers: Sit to/from Stand Sit to Stand: Min guard         General transfer comment: v/c's to push up from bed  Ambulation/Gait Ambulation/Gait assistance: Min guard Ambulation Distance (Feet): 25 Feet Assistive device: Pushed wheelchair Gait Pattern/deviations: Step-through pattern;Decreased stride length Gait velocity: slow   General Gait Details: extremely SOB however pt SpO2 >94% on RA  Stairs            Wheelchair Mobility    Modified  [place] (Stroke Patients Only)       Balance Overall balance assessment: Needs assistance Sitting-balance support: Feet supported;No upper extremity supported Sitting balance-Leahy Scale: Fair     Standing balance support: Single extremity supported Standing balance-Leahy Scale: Fair                               Pertinent Vitals/Pain Pain Assessment: 0-10 Pain Score: 8  Pain Location: back Pain Descriptors / Indicators: Aching Pain Intervention(s): Monitored during session    Home Living Family/patient expects to be discharged to:: Private residence Living Arrangements: Spouse/significant other (grandchildren 58 and 24yo) Available Help at Discharge: Family;Available 24 hours/day Type of Home: House Home Access: Stairs to enter Entrance Stairs-Rails: Right Entrance Stairs-Number of Steps: 1 Home Layout: One level Home Equipment: Walker - 2 wheels;Shower seat - built in;Grab bars - toilet;Bedside commode      Prior Function Level of Independence: Independent               Hand Dominance   Dominant Hand: Right    Extremity/Trunk Assessment   Upper Extremity Assessment Upper Extremity Assessment: Generalized weakness    Lower Extremity Assessment Lower Extremity Assessment: Generalized weakness    Cervical / Trunk Assessment Cervical / Trunk Assessment: Other exceptions Cervical / Trunk Exceptions: chronic back pain  Communication   Communication:  (soft spoken)  Cognition Arousal/Alertness: Awake/alert Behavior During Therapy: WFL for tasks assessed/performed Overall Cognitive Status: Within Functional Limits for tasks assessed  General Comments      Exercises     Assessment/Plan    PT Assessment Patient needs continued PT services  PT Problem List Decreased strength;Decreased activity tolerance;Decreased balance;Decreased mobility          PT Treatment Interventions DME instruction;Gait  training;Stair training;Functional mobility training;Therapeutic activities;Therapeutic exercise    PT Goals (Current goals can be found in the Care Plan section)  Acute Rehab PT Goals Patient Stated Goal: home PT Goal Formulation: With patient Time For Goal Achievement: 09/18/16 Potential to Achieve Goals: Good Additional Goals Additional Goal #1: Pt to be indep with utilizing energery conservation techniques.    Frequency Min 3X/week   Barriers to discharge        Co-evaluation               End of Session Equipment Utilized During Treatment: Gait belt;Oxygen (3Lo2 via [place]) Activity Tolerance:  (limited by SOB) Patient left: in chair;with call bell/phone within reach Nurse Communication: Mobility status         Time: 1101-1116 PT Time Calculation (min) (ACUTE ONLY): 15 min   Charges:   PT Evaluation $PT Eval Moderate Complexity: 1 Procedure     PT G Codes:        Mitchell Iwanicki M Sent[place] Brent 09/11/2016, 12:35 PM   Kittie Plater, PT, DPT Pager #: [phone] Office #: [phone]

## 2016-09-11 NOTE — Progress Notes (Signed)
Physical Therapy Treatment Patient Details Name: Olivia Randall MRN: [id number] DOB: [birth year] Today's Date: 09/11/2016    History of Present Illness 65 year old woman with COPD on 3 L home oxygen and multiple sclerosis was brought in by [facility] emergency department for respiratory distress. EMS found her oxygen saturation to be 74% on arrival. She had called EMS twice in the same day. She was noted to have acute respiratory acidosis on ABG, failed BiPAP and continues nebs and was eventually intubated. She was then transferred to ICU at [facility]    PT Comments    Pt wanting to get up to chair, PT to facilitate.  Pt with significant DOE 2-3/4 with just bed mobility and transfer to chair on 3 L O2 [place].  Min guard assist, safer with RW to hold to during transfer. D/c recs remain the same.  PT will continue to follow acutely.   Follow Up Recommendations  Home health PT;Supervision/Assistance - 24 hour     Equipment Recommendations  Rolling walker with 5" wheels    Recommendations for Other Services   NA     Precautions / Restrictions Precautions Precautions: Fall Precaution Comments: 3Lo2 via  and extremely SOB Restrictions Weight Bearing Restrictions: No    Mobility  Bed Mobility Overal bed mobility: Needs Assistance Bed Mobility: Supine to Sit     Supine to sit: Supervision;HOB elevated     General bed mobility comments: supervision for safety HOB elevated and pt pulling on railing.  DOE 2-3/4 with just getting EOB.   Transfers Overall transfer level: Needs assistance Equipment used: Rolling walker (2 wheeled) Transfers: Sit to/from [facility] Sit to Stand: Min guard         General transfer comment: Min guard assist for safety RW used for stability.  Ambulation/Gait Ambulation/Gait assistance: Min guard Ambulation Distance (Feet): 25 Feet Assistive device: Pushed wheelchair Gait Pattern/deviations: Step-through pattern;Decreased stride  length Gait velocity: slow   General Gait Details: extremely SOB however pt SpO2 >94% on RA          Balance Overall balance assessment: Needs assistance Sitting-balance support: Feet supported;No upper extremity supported Sitting balance-Leahy Scale: Fair     Standing balance support: Single extremity supported Standing balance-Leahy Scale: Fair                      Cognition Arousal/Alertness: Awake/alert Behavior During Therapy: WFL for tasks assessed/performed Overall Cognitive Status: Within Functional Limits for tasks assessed                             Pertinent Vitals/Pain Pain Assessment: 0-10 Pain Score: 8  Pain Location: back Pain Descriptors / Indicators: Aching Pain Intervention(s): Limited activity within patient's tolerance;Monitored during session;Repositioned    Home Living Family/patient expects to be discharged to:: Private residence Living Arrangements: Spouse/significant other (grandchildren 50 and 53yo) Available Help at Discharge: Family;Available 24 hours/day Type of Home: House Home Access: Stairs to enter Entrance Stairs-Rails: Right Home Layout: One level Home Equipment: Walker - 2 wheels;Shower seat - built in;Grab bars - toilet;Bedside commode      Prior Function Level of Independence: Independent          PT Goals (current goals can now be found in the care plan section) Acute Rehab PT Goals Patient Stated Goal: home PT Goal Formulation: With patient Time For Goal Achievement: 09/18/16 Potential to Achieve Goals: Good Additional Goals Additional Goal #1: Pt to  be indep with utilizing energery conservation techniques. Progress towards PT goals: Progressing toward goals    Frequency    Min 3X/week      PT Plan Current plan remains appropriate       End of Session Equipment Utilized During Treatment: Gait belt;Oxygen Activity Tolerance: Patient limited by fatigue;Other (comment) (limited by  DOE) Patient left: with chair alarm set;in chair     Time: 0092-3300 PT Time Calculation (min) (ACUTE ONLY): 11 min  Charges:  $Therapeutic Activity: 8-22 mins                      Rebekha Diveley B. Eliya Geiman, PT, DPT [phone]   09/11/2016, 2:15 PM

## 2016-09-12 ENCOUNTER — Encounter (HOSPITAL_COMMUNITY): Payer: Self-pay | Admitting: *Deleted

## 2016-09-12 LAB — BASIC METABOLIC PANEL
Anion gap: 7 (ref 5–15)
BUN: 22 mg/dL — AB (ref 6–20)
CO2: 44 mmol/L — ABNORMAL HIGH (ref 22–32)
CREATININE: 0.53 mg/dL (ref 0.44–1.00)
Calcium: 9.8 mg/dL (ref 8.9–10.3)
Chloride: 87 mmol/L — ABNORMAL LOW (ref 101–111)
GFR calc Af Amer: >60 mL/min (ref >60)
GLUCOSE: 99 mg/dL (ref 65–99)
POTASSIUM: 3.1 mmol/L — AB (ref 3.5–5.1)
SODIUM: 138 mmol/L (ref 135–145)

## 2016-09-12 LAB — MAGNESIUM: Magnesium: 2.1 mg/dL (ref 1.7–2.4)

## 2016-09-12 MED ORDER — POTASSIUM CHLORIDE CRYS ER 20 MEQ PO TBCR: 40.0000 meq | EXTENDED_RELEASE_TABLET | Freq: Every day | ORAL | Status: DC

## 2016-09-12 MED ORDER — ALPRAZOLAM 0.5 MG PO TABS: 0.5000 mg | ORAL_TABLET | Freq: Four times a day (QID) | ORAL | Status: DC | PRN

## 2016-09-12 MED ORDER — FUROSEMIDE 20 MG PO TABS
20.0000 mg | ORAL_TABLET | Freq: Every day | ORAL | Status: DC
  Administered 2016-09-12 – 2016-09-14 (×3): 20 mg via ORAL
  Filled 2016-09-12 (×3): qty 1

## 2016-09-12 MED ORDER — PANTOPRAZOLE SODIUM 40 MG PO PACK
40.0000 mg | PACK | Freq: Every day | ORAL | Status: DC
  Administered 2016-09-12 – 2016-09-13 (×2): 40 mg via ORAL
  Filled 2016-09-12 (×3): qty 20

## 2016-09-12 MED ORDER — METOPROLOL TARTRATE 5 MG/5ML IV SOLN
2.5000 mg | Freq: Once | INTRAVENOUS | Status: AC
  Administered 2016-09-12: 2.5 mg via INTRAVENOUS
  Filled 2016-09-12: qty 5

## 2016-09-12 MED ORDER — POTASSIUM CHLORIDE CRYS ER 20 MEQ PO TBCR
40.0000 meq | EXTENDED_RELEASE_TABLET | Freq: Once | ORAL | Status: AC
  Administered 2016-09-12: 40 meq via ORAL
  Filled 2016-09-12: qty 2

## 2016-09-12 NOTE — Evaluation (Signed)
Clinical/Bedside Swallow Evaluation Patient Details  Name: Olivia Randall MRN: [id number] Date of Birth: 01/09/1951  Today's Date: 09/12/2016 Time: SLP Start Time (ACUTE ONLY): 0946 SLP Stop Time (ACUTE ONLY): 1001 SLP Time Calculation (min) (ACUTE ONLY): 15 min  Past Medical History:  Past Medical History:  Diagnosis Date  . Anxiety and depression   . Asthma   . Chronic back pain   . Chronic neck pain   . COPD (chronic obstructive pulmonary disease) ([place])   . 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 Depression   . GERD (gastroesophageal reflux disease)   . Hypertension   . MS (multiple sclerosis) ([place])   . MS (multiple sclerosis) ([place])    staes xrays showed signs of ms  . On home O2    2L N/C  . Shortness of breath dyspnea   . Sleep apnea    Past Surgical History:  Past Surgical History:  Procedure Laterality Date  . ABDOMINAL HYSTERECTOMY    . BIOPSY  04/24/2015   Procedure: BIOPSY (Gastric);  Surgeon: [name], MD;  Location: [facility];  Service: Endoscopy;;  . BLADDER SURGERY    . BREAST SURGERY    . CARPAL TUNNEL RELEASE    . CHOLECYSTECTOMY    . ESOPHAGOGASTRODUODENOSCOPY  2009   Dr. Laural Golden: small sliding hiatal hernia with mild reflux esophagitis, empiric dilation with 54/56 F, nonerosive antral gastritis   . ESOPHAGOGASTRODUODENOSCOPY (EGD) WITH PROPOFOL N/A 04/24/2015   RMR: normal esophagus status post Maloney dilation. 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   [name] HEMORRHOID SURGERY    . MALONEY DILATION N/A 04/24/2015   Procedure: MALONEY ESOPHAGEAL DILATION (54FR);  Surgeon: [name], MD;  Location: [facility];  Service: Endoscopy;  Laterality: N/A;  . NECK SURGERY    . SHOULDER SURGERY Right    HPI:  65 year old woman with COPD on 3 L home oxygen and multiple sclerosis was brought in by [facility] emergency department for respiratory distress. EMS found her oxygen saturation to be 74% on arrival. She had called  EMS twice in the same day. She was noted to have acute respiratory acidosis on ABG, failed BiPAP and continues nebs and was eventually intubated. She was then transferred to ICU at [facility]. She is on high dose xanax and narcotics at home. Also, dementia medications. Intubated from 12/24 to 12/29. Started on diet 12/30.    Assessment / Plan / Recommendation Clinical Impression  Pt demonstrates mulitple risk factors for aspiration and decreased sensation of aspriation events. Pt has COPD and is easily short of breath with minimal effort, she is also aphonic with strong but ineffective cough mechanism due to lack of audble vocal fold adduction (unclear if this is new s/p intubation). She also has a history of Multiple Sclerosis, esophageal stricture and GERD (? Neck surgery). During subjective evaluation pt demonstrated no immediate signs of aspiration even with a 3 oz water test. She does get short of breath with intake. She reports easily choking with her food. Given findings would recommend pt continue current diet though f/u with objective swallow testing, preferably FEES, is recommended during admission. Offered basic respiratory/swallow precautions and suggest giving pills with puree as RN noticed difficulty with this. Will f/u for objective testing.     Aspiration Risk  Moderate aspiration risk    Diet Recommendation Regular;Thin liquid   Liquid Administration via: Cup Medication Administration: Whole meds with puree Supervision: Patient able to self feed  Compensations: Slow rate;Small sips/bites Postural Changes: Seated upright at 90 degrees    Other  Recommendations Oral Care Recommendations: Oral care BID   Follow up Recommendations 24 hour supervision/assistance      Frequency and Duration            Prognosis        Swallow Study   General HPI: 65 year old woman with COPD on 3 L home oxygen and multiple sclerosis was brought in by [facility] emergency department for  respiratory distress. EMS found her oxygen saturation to be 74% on arrival. She had called EMS twice in the same day. She was noted to have acute respiratory acidosis on ABG, failed BiPAP and continues nebs and was eventually intubated. She was then transferred to ICU at [facility]. She is on high dose xanax and narcotics at home. Also, dementia medications. Intubated from 12/24 to 12/29. Started on diet 12/30.  Type of Study: Bedside Swallow Evaluation Diet Prior to this Study: Regular;Thin liquids Temperature Spikes Noted: No Respiratory Status: Nasal cannula History of Recent Intubation: Yes Length of Intubations (days): 6 days Date extubated: 09/10/16 Behavior/Cognition: Alert;Cooperative Oral Cavity Assessment: Within Functional Limits Oral Care Completed by SLP: No Oral Cavity - Dentition: Edentulous Vision: Functional for self-feeding Self-Feeding Abilities: Able to feed self Patient Positioning: Other (comment) (edge of bed) Baseline Vocal Quality: Aphonic Volitional Cough: Strong (strong effort, but no adduction) Volitional Swallow: Able to elicit    Oral/Motor/Sensory Function Overall Oral Motor/Sensory Function: Within functional limits   Ice Chips Ice chips: Not tested   Thin Liquid Thin Liquid: Within functional limits Presentation: Cup;Straw    Nectar Thick Nectar Thick Liquid: Not tested   Honey Thick Honey Thick Liquid: Not tested   Puree Puree: Within functional limits Presentation: Self Fed;Spoon   Solid   GO   Solid:  [facility] given missing dentition)       Herbie Baltimore, MA CCC-SLP [phone]  Lynann Beaver 09/12/2016,10:04 AM

## 2016-09-12 NOTE — Progress Notes (Signed)
PROGRESS NOTE    Olivia Randall  QAS:[id number] DOB: 03-16-1951 DOA: 09/05/2016 PCP: Octavio Graves, DO  Brief Narrative: HISTORY OF PRESENT ILLNESS:   65 year old woman with COPD on 2-3 L home oxygen, Dementia, MS, was brought in by [facility] emergency department for respiratory distress. EMS found her oxygen saturation to be 74% on arrival. She had called EMS twice in the same day. She was noted to have acute respiratory acidosis on ABG, failed BiPAP and continues nebs and was eventually intubated, admitted  to ICU at [facility]. Extubated 12/30 and transferred to TRH/floor today  Assessment & Plan:  Acute on Chronic Hypoxemic Respiratory Failure -Due to COPD with exacerbation. -s/p VDRF -transition to pred taper -off Abx now  COPD with acute exacerbation -as above  CAD -stable, continue plavix, not on BB likely due to COPD  HTN -resume lasix  Tobacco Abuse -Counseled on cessation, reportedly stopped this admission  Dementia -mild on aricept  Depression/anxiety -continue xanax/paxil  DVT proph: lovenox  Code Status: Partial Code Family Communication: no family at bedside Disposition Plan: Home in 1-2days  Consultants:     Subjective: Feels weak, breathing improving  Objective: Vitals:   09/11/16 1954 09/11/16 2033 09/12/16 0538 09/12/16 0752  BP: 136/69  137/61   Pulse: (!) 106  97   Resp: 18  18   Temp: 98.8 F (37.1 C)  98.6 F (37 C)   TempSrc: Oral  Oral   SpO2: 95% 96% 95% 91%  Weight:   47.1 kg (103 lb 12.8 oz)   Height:        Intake/Output Summary (Last 24 hours) at 09/12/16 1132 Last data filed at 09/12/16 0900  Gross per 24 hour  Intake              360 ml  Output                0 ml  Net              360 ml   Filed Weights   09/10/16 0600 09/11/16 0500 09/12/16 0538  Weight: 49.7 kg (109 lb 9.1 oz) 48.7 kg (107 lb 5.8 oz) 47.1 kg (103 lb 12.8 oz)    Examination:  General exam: Appears calm and comfortable, chronically ill  and frail Respiratory system: poor air movement, no wheezing Cardiovascular system: S1 & S2 heard, RRR. No JVD,  No pedal edema. Gastrointestinal system: Abdomen is nondistended, soft and nontender. Normal bowel sounds heard. Central nervous system: Alert and oriented. No focal neurological deficits. Extremities: Symmetric 5 x 5 power. Skin: No rashes, lesions or ulcers Psychiatry: Judgement and insight appear normal. Mood & affect appropriate.     Data Reviewed: I have personally reviewed following labs and imaging studies  CBC:  Recent Labs Lab 09/07/16 0312 09/08/16 0428 09/09/16 0352 09/10/16 0333 09/11/16 0423  WBC 8.0 9.2 11.2* 13.8* 13.8*  HGB 10.0* 10.4* 11.6* 12.3 12.6  HCT 33.0* 35.5* 39.9 42.1 41.6  MCV 86.8 89.4 89.3 89.2 86.8  PLT 136* 161 182 217 798   Basic Metabolic Panel:  Recent Labs Lab 09/07/16 0312 09/08/16 0428 09/09/16 0352 09/10/16 0333 09/11/16 0423 09/12/16 0319  NA 139 142 139 139 137 138  K 4.5 5.0 4.4 4.4 4.7 3.1*  CL 102 105 93* 82* 85* 87*  CO2 31 33* 39* 48* 40* 44*  GLUCOSE 127* 136* 146* 137* 103* 99  BUN 21* 23* 24* 23* 24* 22*  CREATININE 0.70 0.52 0.58 0.60  0.61 0.53  CALCIUM 9.6 9.7 10.0 9.8 10.2 9.8  MG 1.9 2.0 1.8 2.1 1.9 2.1  PHOS 2.5 2.8 2.4* 2.5 3.2  --    GFR: Estimated Creatinine Clearance: 51.4 mL/min (by C-G formula based on SCr of 0.53 mg/dL). Liver Function Tests: No results for input(s): AST, ALT, ALKPHOS, BILITOT, PROT, ALBUMIN in the last 168 hours. No results for input(s): LIPASE, AMYLASE in the last 168 hours. No results for input(s): AMMONIA in the last 168 hours. Coagulation Profile: No results for input(s): INR, PROTIME in the last 168 hours. Cardiac Enzymes: No results for input(s): CKTOTAL, CKMB, CKMBINDEX, TROPONINI in the last 168 hours. BNP (last 3 results) No results for input(s): PROBNP in the last 8760 hours. HbA1C: No results for input(s): HGBA1C in the last 72 hours. CBG:  Recent  Labs Lab 09/10/16 2043 09/10/16 2349 09/11/16 0426 09/11/16 0731 09/11/16 1145  GLUCAP 119* 91 103* 83 82   Lipid Profile:  Recent Labs  09/11/16 0423  TRIG 72   Thyroid Function Tests: No results for input(s): TSH, T4TOTAL, FREET4, T3FREE, THYROIDAB in the last 72 hours. Anemia Panel: No results for input(s): VITAMINB12, FOLATE, FERRITIN, TIBC, IRON, RETICCTPCT in the last 72 hours. Urine analysis:    Component Value Date/Time   COLORURINE YELLOW 09/05/2016 0301   APPEARANCEUR HAZY (A) 09/05/2016 0301   LABSPEC 1.023 09/05/2016 0301   PHURINE 5.0 09/05/2016 0301   GLUCOSEU NEGATIVE 09/05/2016 0301   HGBUR NEGATIVE 09/05/2016 0301   BILIRUBINUR NEGATIVE 09/05/2016 0301   KETONESUR NEGATIVE 09/05/2016 0301   PROTEINUR 100 (A) 09/05/2016 0301   UROBILINOGEN 0.2 03/18/2015 0630   NITRITE NEGATIVE 09/05/2016 0301   LEUKOCYTESUR NEGATIVE 09/05/2016 0301   Sepsis Labs: _0 (procalcitonin:4,lacticidven:4)  ) Recent Results (from the past 240 hour(s))  MRSA PCR Screening     Status: None   Collection Time: 09/05/16  6:25 AM  Result Value Ref Range Status   MRSA by PCR NEGATIVE NEGATIVE Final    Comment:        The GeneXpert MRSA Assay (FDA approved for NASAL specimens only), is one component of a comprehensive MRSA colonization surveillance program. It is not intended to diagnose MRSA infection nor to guide or monitor treatment for MRSA infections.          Radiology Studies: Dg Chest Port 1 View  Result Date: 09/11/2016 CLINICAL DATA:  acute respiratory failure, hypoxia EXAM: PORTABLE CHEST 1 VIEW COMPARISON:  09/10/2016 FINDINGS: There is hyperinflation of the lungs compatible with COPD. Interval extubation. No confluent airspace opacities. Blunting of the right costophrenic angle may reflect a small right effusion. Heart is normal size. No acute bony abnormality. IMPRESSION: COPD.  Possible small right effusion. Electronically Signed   By: Rolm Baptise M.D.   On: 09/11/2016 08:22        Scheduled Meds: . arformoterol  15 mcg Nebulization BID  . budesonide (PULMICORT) nebulizer solution  0.5 mg Nebulization BID  . chlorhexidine gluconate (MEDLINE KIT)  15 mL Mouth Rinse BID  . clopidogrel  75 mg Oral Daily  . donepezil  10 mg Oral QHS  . enoxaparin (LOVENOX) injection  40 mg Subcutaneous Daily  . ipratropium-albuterol  3 mL Nebulization TID  . mouth rinse  15 mL Mouth Rinse BID  . methylPREDNISolone (SOLU-MEDROL) injection  40 mg Intravenous Q24H  . multivitamin  15 mL Per Tube Daily  . pantoprazole sodium  40 mg Oral Daily  . PARoxetine  40 mg Oral Daily   Continuous  Infusions:   LOS: 7 days    Time spent: 45mn    PDomenic Polite MD [facility] Pager [phone] If 7PM-7AM, please contact night-coverage [email or web address] Password [email or web address] 09/12/2016, 11:32 AM

## 2016-09-13 LAB — CBC
HCT: 37 % (ref 36.0–46.0)
HEMOGLOBIN: 11.3 g/dL — AB (ref 12.0–15.0)
MCH: 26.3 pg (ref 26.0–34.0)
MCHC: 30.5 g/dL (ref 30.0–36.0)
MCV: 86 fL (ref 78.0–100.0)
PLATELETS: 281 10*3/uL (ref 150–400)
RBC: 4.3 MIL/uL (ref 3.87–5.11)
RDW: 14.2 % (ref 11.5–15.5)
WBC: 11.5 10*3/uL — ABNORMAL HIGH (ref 4.0–10.5)

## 2016-09-13 LAB — BASIC METABOLIC PANEL
Anion gap: 9 (ref 5–15)
BUN: 7 mg/dL (ref 6–20)
CALCIUM: 9.6 mg/dL (ref 8.9–10.3)
CHLORIDE: 86 mmol/L — AB (ref 101–111)
CO2: 42 mmol/L — AB (ref 22–32)
CREATININE: 0.62 mg/dL (ref 0.44–1.00)
GFR calc Af Amer: >60 mL/min (ref >60)
GFR calc non Af Amer: >60 mL/min (ref >60)
Glucose, Bld: 165 mg/dL — ABNORMAL HIGH (ref 65–99)
Potassium: 3.1 mmol/L — ABNORMAL LOW (ref 3.5–5.1)
Sodium: 137 mmol/L (ref 135–145)

## 2016-09-13 MED ORDER — POTASSIUM CHLORIDE CRYS ER 20 MEQ PO TBCR
40.0000 meq | EXTENDED_RELEASE_TABLET | Freq: Two times a day (BID) | ORAL | Status: DC
  Administered 2016-09-13 – 2016-09-14 (×3): 40 meq via ORAL
  Filled 2016-09-13 (×3): qty 2

## 2016-09-13 MED ORDER — PREDNISONE 20 MG PO TABS
50.0000 mg | ORAL_TABLET | Freq: Every day | ORAL | Status: DC
  Administered 2016-09-13 – 2016-09-14 (×2): 50 mg via ORAL
  Filled 2016-09-13 (×2): qty 1

## 2016-09-13 NOTE — Progress Notes (Signed)
PROGRESS NOTE    Olivia Randall  WUJ:[id number] DOB: [birth year] DOA: 09/05/2016 PCP: Octavio Graves, DO  Brief Narrative: HISTORY OF PRESENT ILLNESS:   66 year old woman with COPD on 2-3 L home oxygen, Dementia, MS, was brought in by [facility] emergency department for respiratory distress. EMS found her oxygen saturation to be 74% on arrival. She had called EMS twice in the same day. She was noted to have acute respiratory acidosis on ABG, failed BiPAP and continues nebs and was eventually intubated, admitted  to ICU at [facility]. Extubated 12/30 and transferred to TRH/floor today  Assessment & Plan:  Acute on Chronic Hypoxemic Respiratory Failure -Due to COPD with exacerbation. -s/p VDRF -transition to pred taper today -off Abx now -nebs, ambulate, wean O2 as tolerated -home tomorrow  COPD with acute exacerbation -as above  CAD -stable, continue plavix, not on BB likely due to COPD  HTN -resume lasix  Tobacco Abuse -Counseled on cessation, reportedly stopped this admission  Dementia -mild on aricept  Depression/anxiety -continue xanax/paxil  DVT proph: lovenox  Code Status: Partial Code Family Communication: spouse at bedside Disposition Plan: Home tomorrow  Consultants:     Subjective: Feels weak, breathing improving  Objective: Vitals:   09/12/16 2102 09/12/16 2104 09/13/16 0416 09/13/16 0912  BP: (!) 145/80  (!) 129/58   Pulse: (!) 108  91   Resp: 18  18   Temp: 98.6 F (37 C)  98.4 F (36.9 C)   TempSrc: Oral  Oral   SpO2: 93% 93% 94% 92%  Weight:      Height:        Intake/Output Summary (Last 24 hours) at 09/13/16 1110 Last data filed at 09/13/16 0900  Gross per 24 hour  Intake              360 ml  Output                0 ml  Net              360 ml   Filed Weights   09/10/16 0600 09/11/16 0500 09/12/16 0538  Weight: 49.7 kg (109 lb 9.1 oz) 48.7 kg (107 lb 5.8 oz) 47.1 kg (103 lb 12.8 oz)    Examination:  General exam:  Appears calm and comfortable, chronically ill and frail Respiratory system: poor air movement, no wheezing Cardiovascular system: S1 & S2 heard, RRR. No JVD,  No pedal edema. Gastrointestinal system: Abdomen is nondistended, soft and nontender. Normal bowel sounds heard. Central nervous system: Alert and oriented. No focal neurological deficits. Extremities: Symmetric 5 x 5 power. Skin: No rashes, lesions or ulcers Psychiatry: Judgement and insight appear normal. Mood & affect appropriate.     Data Reviewed: I have personally reviewed following labs and imaging studies  CBC:  Recent Labs Lab 09/08/16 0428 09/09/16 0352 09/10/16 0333 09/11/16 0423 09/13/16 0033  WBC 9.2 11.2* 13.8* 13.8* 11.5*  HGB 10.4* 11.6* 12.3 12.6 11.3*  HCT 35.5* 39.9 42.1 41.6 37.0  MCV 89.4 89.3 89.2 86.8 86.0  PLT 161 182 217 227 956   Basic Metabolic Panel:  Recent Labs Lab 09/07/16 0312 09/08/16 0428 09/09/16 0352 09/10/16 0333 09/11/16 0423 09/12/16 0319 09/13/16 0033  NA 139 142 139 139 137 138 137  K 4.5 5.0 4.4 4.4 4.7 3.1* 3.1*  CL 102 105 93* 82* 85* 87* 86*  CO2 31 33* 39* 48* 40* 44* 42*  GLUCOSE 127* 136* 146* 137* 103* 99 165*  BUN 21*  23* 24* 23* 24* 22* 7  CREATININE 0.70 0.52 0.58 0.60 0.61 0.53 0.62  CALCIUM 9.6 9.7 10.0 9.8 10.2 9.8 9.6  MG 1.9 2.0 1.8 2.1 1.9 2.1  --   PHOS 2.5 2.8 2.4* 2.5 3.2  --   --    GFR: Estimated Creatinine Clearance: 51.4 mL/min (by C-G formula based on SCr of 0.62 mg/dL). Liver Function Tests: No results for input(s): AST, ALT, ALKPHOS, BILITOT, PROT, ALBUMIN in the last 168 hours. No results for input(s): LIPASE, AMYLASE in the last 168 hours. No results for input(s): AMMONIA in the last 168 hours. Coagulation Profile: No results for input(s): INR, PROTIME in the last 168 hours. Cardiac Enzymes: No results for input(s): CKTOTAL, CKMB, CKMBINDEX, TROPONINI in the last 168 hours. BNP (last 3 results) No results for input(s): PROBNP in  the last 8760 hours. HbA1C: No results for input(s): HGBA1C in the last 72 hours. CBG:  Recent Labs Lab 09/10/16 2043 09/10/16 2349 09/11/16 0426 09/11/16 0731 09/11/16 1145  GLUCAP 119* 91 103* 83 82   Lipid Profile:  Recent Labs  09/11/16 0423  TRIG 72   Thyroid Function Tests: No results for input(s): TSH, T4TOTAL, FREET4, T3FREE, THYROIDAB in the last 72 hours. Anemia Panel: No results for input(s): VITAMINB12, FOLATE, FERRITIN, TIBC, IRON, RETICCTPCT in the last 72 hours. Urine analysis:    Component Value Date/Time   COLORURINE YELLOW 09/05/2016 0301   APPEARANCEUR HAZY (A) 09/05/2016 0301   LABSPEC 1.023 09/05/2016 0301   PHURINE 5.0 09/05/2016 0301   GLUCOSEU NEGATIVE 09/05/2016 0301   HGBUR NEGATIVE 09/05/2016 0301   BILIRUBINUR NEGATIVE 09/05/2016 0301   KETONESUR NEGATIVE 09/05/2016 0301   PROTEINUR 100 (A) 09/05/2016 0301   UROBILINOGEN 0.2 03/18/2015 0630   NITRITE NEGATIVE 09/05/2016 0301   LEUKOCYTESUR NEGATIVE 09/05/2016 0301   Sepsis Labs: @LABRCNTIP (procalcitonin:4,lacticidven:4)  ) Recent Results (from the past 240 hour(s))  MRSA PCR Screening     Status: None   Collection Time: 09/05/16  6:25 AM  Result Value Ref Range Status   MRSA by PCR NEGATIVE NEGATIVE Final    Comment:        The GeneXpert MRSA Assay (FDA approved for NASAL specimens only), is one component of a comprehensive MRSA colonization surveillance program. It is not intended to diagnose MRSA infection nor to guide or monitor treatment for MRSA infections.          Radiology Studies: No results found.      Scheduled Meds: . arformoterol  15 mcg Nebulization BID  . budesonide (PULMICORT) nebulizer solution  0.5 mg Nebulization BID  . chlorhexidine gluconate (MEDLINE KIT)  15 mL Mouth Rinse BID  . clopidogrel  75 mg Oral Daily  . donepezil  10 mg Oral QHS  . enoxaparin (LOVENOX) injection  40 mg Subcutaneous Daily  . furosemide  20 mg Oral Daily  .  ipratropium-albuterol  3 mL Nebulization TID  . mouth rinse  15 mL Mouth Rinse BID  . multivitamin  15 mL Per Tube Daily  . pantoprazole sodium  40 mg Oral Daily  . PARoxetine  40 mg Oral Daily  . potassium chloride  40 mEq Oral BID  . predniSONE  50 mg Oral Q breakfast   Continuous Infusions:   LOS: 8 days    Time spent: 67mn    PDomenic Polite MD [facility] Pager [phone] If 7PM-7AM, please contact night-coverage [email or web address] Password [email or web address] 09/13/2016, 11:10 AM

## 2016-09-13 NOTE — Progress Notes (Signed)
Physical Therapy Treatment Patient Details Name: GERDA YIN MRN: [id number] DOB: 08/31/1951 Today's Date: 09/13/2016    History of Present Illness 66 year old woman with COPD on 3 L home oxygen and multiple sclerosis was brought in by [facility] emergency department for respiratory distress. EMS found her oxygen saturation to be 74% on arrival. She had called EMS twice in the same day. She was noted to have acute respiratory acidosis on ABG, failed BiPAP and continues nebs and was eventually intubated. She was then transferred to ICU at [facility].    PT Comments    Pt able to walk further today than previous sessions with no standing rest breaks and 2-3/4 DOE.  Pt reports her breathing is not yet back to baseline, but is is improved over when she got here.  PT will continue to follow acutely, however, pt is hopeful to d/c home tomorrow.    Follow Up Recommendations  Home health PT;Supervision/Assistance - 24 hour     Equipment Recommendations  Rolling walker with 5" wheels    Recommendations for Other Services   NA     Precautions / Restrictions Precautions Precautions: Fall Precaution Comments: 3 L O2 [place]    Mobility  Bed Mobility               General bed mobility comments: Pt seated EOB  Transfers Overall transfer level: Needs assistance Equipment used: Rolling walker (2 wheeled) Transfers: Sit to/from Stand Sit to Stand: Supervision         General transfer comment: supervision for safety  Ambulation/Gait Ambulation/Gait assistance: Min guard Ambulation Distance (Feet): 120 Feet Assistive device: Rolling walker (2 wheeled) Gait Pattern/deviations: Step-through pattern;Shuffle Gait velocity: decreased Gait velocity interpretation: Below normal speed for age/gender General Gait Details: Pt with slow, labored gait.  DOE 2-3/4, O2 sats stable on 2 L O2           Balance Overall balance assessment: Needs assistance Sitting-balance support: No upper  extremity supported;Feet supported Sitting balance-Leahy Scale: Good     Standing balance support: Bilateral upper extremity supported;No upper extremity supported;Single extremity supported Standing balance-Leahy Scale: Fair                      Cognition Arousal/Alertness: Awake/alert Behavior During Therapy: WFL for tasks assessed/performed Overall Cognitive Status: History of cognitive impairments - at baseline                             Pertinent Vitals/Pain Pain Assessment: Faces Faces Pain Scale: Hurts little more Pain Location: back Pain Descriptors / Indicators: Aching Pain Intervention(s): Limited activity within patient's tolerance;Monitored during session;Repositioned           PT Goals (current goals can now be found in the care plan section) Acute Rehab PT Goals Patient Stated Goal: home Progress towards PT goals: Progressing toward goals    Frequency    Min 3X/week      PT Plan Current plan remains appropriate       End of Session Equipment Utilized During Treatment: Oxygen Activity Tolerance: Patient limited by fatigue Patient left: in bed (seated EOB)     Time: 0350-0938 PT Time Calculation (min) (ACUTE ONLY): 9 min  Charges:  $Gait Training: 8-22 mins                      Tanashia Ciesla B. [place], [place], DPT [phone]   09/13/2016, 4:52 PM

## 2016-09-14 MED ORDER — PREDNISONE 20 MG PO TABS: 20.0000 mg | ORAL_TABLET | Freq: Every day | ORAL | 0 refills | Status: DC

## 2016-09-14 NOTE — Care Management Note (Signed)
Case Management Note  Patient Details  Name: Olivia Randall MRN: [id number] Date of Birth: 1950/11/11  Subjective/Objective:   66 yr old female admitted with COPD exacerbation.                   Action/Plan: Case manager spoke with patient and husband concerning [place] and DME needs. Choice for Home Health agency offered. Referral was called to Stevie Kern, [place] Liaison. Case Manager also informed her for safety of care providers,  that patient has four pitbulls, two indoors and two outdoors. Patient's husband stated that when they call to notify them they are coming, they will put the dogs up. Patient states she has grandchildren that will assist her at discharge, husband is not well.  Expected Discharge Date:   09/14/16               Expected Discharge Plan:  [place]  In-House Referral:  Clinical Social Work  Discharge planning Services  CM Consult  Post Acute Care Choice:  Home Health, Durable Medical Equipment Choice offered to:  Patient, Spouse  DME Arranged:  Walker rolling DME Agency:  [place]:  PT [place]:  [place]  Status of Service:  Completed, signed off  If discussed at [place] of Stay Meetings, dates discussed:    Additional Comments:  Ninfa Meeker, RN 09/14/2016, 10:42 AM

## 2016-09-14 NOTE — Progress Notes (Signed)
Physical Therapy Treatment Patient Details Name: Olivia Randall MRN: [id number] DOB: [birth year] Today's Date: 09/14/2016    History of Present Illness 66 year old woman with COPD on 3 L home oxygen and multiple sclerosis was brought in by [facility] emergency department for respiratory distress. EMS found her oxygen saturation to be 74% on arrival. She had called EMS twice in the same day. She was noted to have acute respiratory acidosis on ABG, failed BiPAP and continues nebs and was eventually intubated. She was then transferred to ICU at [facility].    PT Comments    Pt able to ambulate 150 ft with rw and min guard assistance. Pt reports that she is feeling much better and is hoping to be able to go home today. Pt able to go up/down 1 step and she and husband deny any questions or concerns about D/C. Recommending HHPT services to follow upon D/C.   Follow Up Recommendations  Home health PT;Supervision/Assistance - 24 hour     Equipment Recommendations  Rolling walker with 5" wheels    Recommendations for Other Services       Precautions / Restrictions Precautions Precautions: Fall Precaution Comments: O2 Restrictions Weight Bearing Restrictions: No    Mobility  Bed Mobility               General bed mobility comments: Pt sitting EOB upon arrival  Transfers Overall transfer level: Needs assistance Equipment used: Rolling walker (2 wheeled) Transfers: Sit to/from Stand Sit to Stand: Modified independent (Device/Increase time)         General transfer comment: good stability with standing, no loss of balance  Ambulation/Gait Ambulation/Gait assistance: Min guard Ambulation Distance (Feet): 150 Feet Assistive device: Rolling walker (2 wheeled) Gait Pattern/deviations: Step-to pattern;Decreased step length - right;Decreased step length - left Gait velocity: decreased   General Gait Details: using 2L O2, SpO2 94% upon return from ambulation.    Stairs Stairs:  Yes   Stair Management: One rail Right;Step to pattern;Forwards Number of Stairs: 1 General stair comments: Pt reports feeling confident with getting into her home.   Wheelchair Mobility    Modified Rankin (Stroke Patients Only)       Balance Overall balance assessment: Needs assistance Sitting-balance support: No upper extremity supported Sitting balance-Leahy Scale: Good     Standing balance support: During functional activity Standing balance-Leahy Scale: Fair Standing balance comment: using rw during ambulation                    Cognition Arousal/Alertness: Awake/alert Behavior During Therapy: WFL for tasks assessed/performed Overall Cognitive Status: History of cognitive impairments - at baseline                      Exercises      General Comments        Pertinent Vitals/Pain Pain Assessment: No/denies pain    Home Living                      Prior Function            PT Goals (current goals can now be found in the care plan section) Acute Rehab PT Goals Patient Stated Goal: get home PT Goal Formulation: With patient Time For Goal Achievement: 09/18/16 Potential to Achieve Goals: Good Progress towards PT goals: Progressing toward goals    Frequency    Min 3X/week      PT Plan Current plan remains appropriate  Co-evaluation             End of Session Equipment Utilized During Treatment: Gait belt;Oxygen Activity Tolerance: Patient limited by fatigue Patient left: in bed;with family/visitor present;with call bell/phone within reach (pt requests to sit EOB)     Time: 1000-1021 PT Time Calculation (min) (ACUTE ONLY): 21 min  Charges:  $Gait Training: 8-22 mins                    G Codes:      Cassell Clement, PT, CSCS Pager [phone] Office [phone]  09/14/2016, 10:28 AM

## 2016-09-14 NOTE — Progress Notes (Addendum)
AT 1130 D/c instructions reviewed with pt and husband, copy of instructions and script given to pt. Pt's husband has difficulty walking and car parked far away from entrance. Arranged for hospital volunteer to pick him up at room and arranged for security to pick him up at entrance and take him to his car, so he could come to entrance to pick up pt. Husband took some belongings with him, pt and husband stated her portable O2 tank was in the car.  AT 1155  Pt taken down on hospital O2 tank by unit NT, and with rest of her belongings, along with walker delivered to her room for home use. Pt taken down on 3L O2, in wheelchair with belongings, escorted by unit NT.

## 2016-09-14 NOTE — Care Management (Signed)
Patient's husband had stated that he had her portable oxygen tank in the car. When Tech arrived outside with patient husband stated "he couldn't find tank" and requested to borrow hospital tank with intention of returning tank on 09/15/16. CM contacted [employer] rep-James Landry Mellow to request a travel tank for patient, in the interim, unit AD gave permission for patient to use hospital tank for transport with plan to have it returned to [facility] on 09/15/16. Ricki Miller, RN BSN Case Manager [phone]

## 2016-09-14 NOTE — Discharge Summary (Signed)
Physician Discharge Summary  Olivia Randall [facility] GBT:[id number] DOB: [birth year] DOA: 09/05/2016  PCP: Octavio Graves, DO  Admit date: 09/05/2016 Discharge date: 09/14/2016  Time spent: 35 minutes  Recommendations for Outpatient Follow-up:  1. PCP in 1 week   Discharge Diagnoses:    Acute respiratory failure with hypercapnia and hypoxia(HCC)  Chronic respiratory failure (HCC)   Tobacco use disorder   Anxiety and depression   COPD   Severe malnutrition   CAD   Mild Dementia  Discharge Condition: stable  Diet recommendation: heart healthy  Filed Weights   09/10/16 0600 09/11/16 0500 09/12/16 0538  Weight: 49.7 kg (109 lb 9.1 oz) 48.7 kg (107 lb 5.8 oz) 47.1 kg (103 lb 12.8 oz)    History of present illness:  66 year old woman with COPD on 2-3 L home oxygen, Dementia, MS, was brought in by [facility] emergency department for respiratory distress. EMS found her oxygen saturation to be 74% on arrival. She had called EMS twice in the same day. She was noted to have acute respiratory acidosis on ABG, failed BiPAP and continues nebs and was eventually intubated, admitted  to ICU at [facility].  Hospital Course:    Acute on Chronic Hypoxemic Respiratory Failure -Due to COPD with exacerbation. -s/p VDRF -improved, CXR clear -transitioned to prednisone taper  -off Abx now -ambulated with PT, weaned O2 down to 2L -home health PT set up at discharge  COPD with acute exacerbation -as above  CAD -stable, continue plavix, not on BB likely due to COPD  HTN -stable, ACE resumed  Tobacco Abuse -Counseled on cessation, reportedly stopped this admission  Dementia -mild on aricept  Depression/anxiety -continue xanax/paxil  Procedures: S/p VDRF  Consultations:  PCCM  Discharge Exam: Vitals:   09/13/16 2120 09/14/16 0611  BP: 132/69 (!) 139/59  Pulse: (!) 104 93  Resp: 16 18  Temp: 98.7 F (37.1 C) 98.9 F (37.2 C)    General: AAOx3 Cardiovascular:  S1S2/RRR Respiratory: improved air movement  Discharge Instructions   Discharge Instructions    Diet - low sodium heart healthy    Complete by:  As directed    Increase activity slowly    Complete by:  As directed      Current Discharge Medication List    START taking these medications   Details  predniSONE (DELTASONE) 20 MG tablet Take 1-2 tablets (20-40 mg total) by mouth daily with breakfast. Take '40mg'$  for 2days then '20mg'$  for 2days then '10mg'$  for 2days then STOP Qty: 10 tablet, Refills: 0      CONTINUE these medications which have NOT CHANGED   Details  albuterol (PROAIR HFA) 108 (90 BASE) MCG/ACT inhaler Inhale 2 puffs into the lungs every 6 (six) hours as needed for shortness of breath.     ALPRAZolam (XANAX) 1 MG tablet Take 1 mg by mouth 4 (four) times daily as needed for anxiety.     clopidogrel (PLAVIX) 75 MG tablet Take 75 mg by mouth daily.    cyclobenzaprine (FLEXERIL) 10 MG tablet Take 10 mg by mouth at bedtime.    donepezil (ARICEPT) 10 MG tablet Take 10 mg by mouth at bedtime.     ENSURE (ENSURE) Take 237 mLs by mouth See admin instructions. Drink 1 can (237 mls) by mouth one to two times daily    esomeprazole (NEXIUM) 40 MG capsule Take 40 mg by mouth daily.    guaiFENesin-codeine (ROBITUSSIN AC) 100-10 MG/5ML syrup Take 5-10 mLs by mouth every 6 (six) hours as needed  for cough or congestion.    ipratropium-albuterol (DUONEB) 0.5-2.5 (3) MG/3ML SOLN Take 3 mLs by nebulization every 4 (four) hours as needed (shortness of breath/wheezing).    loratadine (CLARITIN) 10 MG tablet Take 10 mg by mouth daily.     OXYGEN Inhale 2 L into the lungs continuous.    PARoxetine (PAXIL) 40 MG tablet Take 40 mg by mouth daily.     potassium chloride (K-DUR) 10 MEQ tablet Take 10 mEq by mouth daily.     PRESCRIPTION MEDICATION Take 5-10 mLs by mouth every 4 (four) hours as needed (cough). Prescription cough syrup    lisinopril (PRINIVIL,ZESTRIL) 10 MG tablet TAKE ONE  (1) TABLET EACH DAY Qty: 30 tablet, Refills: 6      STOP taking these medications     amoxicillin (AMOXIL) 500 MG capsule      Aspirin-Acetaminophen-Caffeine (GOODY HEADACHE PO)      Oxycodone HCl 20 MG TABS      theophylline (THEODUR) 300 MG 12 hr tablet        No Known Allergies Follow-up Information    CYNTHIA BUTLER, DO. Schedule an appointment as soon as possible for a visit in 1 week(s).   Contact information: [place]  [id number] [phone]            The results of significant diagnostics from this hospitalization (including imaging, microbiology, ancillary and laboratory) are listed below for reference.    Significant Diagnostic Studies: Dg Chest 1 View  Result Date: 09/05/2016 CLINICAL DATA:  66 year old female status post intubation. EXAM: CHEST 1 VIEW COMPARISON:  Chest radiograph dated 09/05/2016 FINDINGS: There is interval placement of an endotracheal tube with tip approximately 13 mm above the carina. Recommend retraction by 3 cm for optimal positioning. An enteric tube courses into the left hemiabdomen with tip beyond the inferior margin of the image. There is emphysematous changes of the lungs with hyperinflation and chronic interstitial coarsening. Chronic right lung bases scarring noted. No focal consolidation, pleural effusion, or pneumothorax. The cardiac silhouette is within normal limits. Lower cervical fixation plate and screws partially visualized. No acute osseous pathology. IMPRESSION: Endotracheal tube the tip approximately 13 mm above the carina. Recommend retraction by 3 cm for optimal positioning. Enteric tube extends into the left hemiabdomen with tip beyond the inferior margin of the image. Emphysema.  No acute cardiopulmonary process. Electronically Signed   By: Anner Crete M.D.   On: 09/05/2016 03:17   Dg Chest Port 1 View  Result Date: 09/11/2016 CLINICAL DATA:  acute respiratory failure, hypoxia EXAM: PORTABLE CHEST  1 VIEW COMPARISON:  09/10/2016 FINDINGS: There is hyperinflation of the lungs compatible with COPD. Interval extubation. No confluent airspace opacities. Blunting of the right costophrenic angle may reflect a small right effusion. Heart is normal size. No acute bony abnormality. IMPRESSION: COPD.  Possible small right effusion. Electronically Signed   By: Rolm Baptise M.D.   On: 09/11/2016 08:22   Dg Chest Port 1 View  Result Date: 09/10/2016 CLINICAL DATA:  Followup for intubated patient. Respiratory failure. EXAM: PORTABLE CHEST 1 VIEW COMPARISON:  09/09/2016 FINDINGS: Cardiac silhouette is normal in size. No mediastinal or hilar masses. Lungs are hyperexpanded. There is chronic pleural thickening at the right lung base blunting the hemidiaphragm, versus a chronic small pleural effusion. No left pleural effusion. No pneumothorax. No evidence of pneumonia or pulmonary edema. Endotracheal tube and nasal/ orogastric tube are stable and well positioned. IMPRESSION: 1. No acute cardiopulmonary disease. 2. COPD. 3.  Well-positioned support apparatus. Electronically Signed   By: Lajean Manes M.D.   On: 09/10/2016 08:11   Dg Chest Port 1 View  Result Date: 09/09/2016 CLINICAL DATA:  Hypoxia EXAM: PORTABLE CHEST 1 VIEW COMPARISON:  September 08, 2016 FINDINGS: Endotracheal tube tip is 3.1 cm above the carina. Nasogastric tube tip and side port are below the diaphragm. No pneumothorax. There is a persistent small right pleural effusion with right base atelectasis. No edema or consolidation. Heart size and pulmonary vascularity are stable and within normal limits. There is atherosclerotic calcification in the aorta. No bone lesions evident. No adenopathy. IMPRESSION: Tube and catheter positions as described without pneumothorax. Persistent right pleural effusion with right base atelectasis. No new opacity. Stable cardiac silhouette. There is aortic atherosclerosis. Electronically Signed   By: Lowella Grip III  M.D.   On: 09/09/2016 07:17   Dg Chest Port 1 View  Result Date: 09/08/2016 CLINICAL DATA:  Acute respiratory failure.  ETT. EXAM: PORTABLE CHEST 1 VIEW COMPARISON:  September 07, 2016 FINDINGS: The ETT is in good position. The NG tube terminates below today's film. Small effusion and opacity is seen in the right base. No focal infiltrates. The cardiomediastinal silhouette is stable. IMPRESSION: 1. Stable support apparatus. 2. Stable small effusion and underlying opacity on the right. Electronically Signed   By: Dorise Bullion III M.D   On: 09/08/2016 07:11   Dg Chest Port 1 View  Result Date: 09/07/2016 CLINICAL DATA:  Respiratory failure. EXAM: PORTABLE CHEST 1 VIEW COMPARISON:  09/05/2016. FINDINGS: Endotracheal tube 1.5 cm above the carina. NG tube appears to be below left hemidiaphragm. The hemidiaphragms however not completely imaged. Heart size stable. Persistent low lung volumes. Changes consistent COPD again noted. Mild increase interstitial markings are noted. An active interstitial process cannot be completely excluded. No prominent pleural effusion. No pneumothorax. Cervicothoracic spine fusion. IMPRESSION: 1. Endotracheal tube tip 1.5 cm above the carina. NG tube tip appears to be below the left hemidiaphragm however the hemidiaphragms are not completely imaged. 2. Changes of COPD again noted. Mild increased interstitial markings are noted. An active interstitial process including pneumonitis cannot be excluded. Electronically Signed   By: Marcello Moores  Register   On: 09/07/2016 07:22   Dg Chest Port 1 View  Result Date: 09/05/2016 CLINICAL DATA:  Intubation, history COPD, hypertension, multiple sclerosis EXAM: PORTABLE CHEST 1 VIEW COMPARISON:  Portable exam 0634 hours compared to 09/05/2016 at 0243 hours FINDINGS: Tip of endotracheal tube projects 3.1 cm above carina. Nasogastric tube extends into stomach. Normal heart size, mediastinal contours, and pulmonary vascularity. Atherosclerotic  calcification aorta. Emphysematous changes with small RIGHT pleural effusion and minimal RIGHT basilar atelectasis. LEFT apical bleb disease. Lungs otherwise clear. No pneumothorax. Bones demineralized with evidence of prior cervicothoracic fusion. IMPRESSION: Emphysematous changes with small RIGHT pleural effusion and basilar atelectasis. Underlying emphysematous changes with LEFT apical blebs. Electronically Signed   By: Lavonia Dana M.D.   On: 09/05/2016 09:20   Dg Chest Port 1 View  Result Date: 09/05/2016 CLINICAL DATA:  Respiratory distress, onset earlier today. Worsened tonight. EXAM: PORTABLE CHEST 1 VIEW COMPARISON:  1127 FINDINGS: Chronic blunting of the right lateral costophrenic angle. The extreme left lateral costophrenic angle is excluded from the image. The visible lungs are clear. Pulmonary vasculature is slightly prominent. No airspace consolidation. Unremarkable hilar and mediastinal contours. IMPRESSION: Slight prominence of the pulmonary vasculature. No interstitial or alveolar edema. Electronically Signed   By: Andreas Newport M.D.   On: 09/05/2016 00:35  Microbiology: Recent Results (from the past 240 hour(s))  MRSA PCR Screening     Status: None   Collection Time: 09/05/16  6:25 AM  Result Value Ref Range Status   MRSA by PCR NEGATIVE NEGATIVE Final    Comment:        The GeneXpert MRSA Assay (FDA approved for NASAL specimens only), is one component of a comprehensive MRSA colonization surveillance program. It is not intended to diagnose MRSA infection nor to guide or monitor treatment for MRSA infections.      Labs: Basic Metabolic Panel:  Recent Labs Lab 09/08/16 0428 09/09/16 0352 09/10/16 0333 09/11/16 0423 09/12/16 0319 09/13/16 0033  NA 142 139 139 137 138 137  K 5.0 4.4 4.4 4.7 3.1* 3.1*  CL 105 93* 82* 85* 87* 86*  CO2 33* 39* 48* 40* 44* 42*  GLUCOSE 136* 146* 137* 103* 99 165*  BUN 23* 24* 23* 24* 22* 7  CREATININE 0.52 0.58 0.60 0.61  0.53 0.62  CALCIUM 9.7 10.0 9.8 10.2 9.8 9.6  MG 2.0 1.8 2.1 1.9 2.1  --   PHOS 2.8 2.4* 2.5 3.2  --   --    Liver Function Tests: No results for input(s): AST, ALT, ALKPHOS, BILITOT, PROT, ALBUMIN in the last 168 hours. No results for input(s): LIPASE, AMYLASE in the last 168 hours. No results for input(s): AMMONIA in the last 168 hours. CBC:  Recent Labs Lab 09/08/16 0428 09/09/16 0352 09/10/16 0333 09/11/16 0423 09/13/16 0033  WBC 9.2 11.2* 13.8* 13.8* 11.5*  HGB 10.4* 11.6* 12.3 12.6 11.3*  HCT 35.5* 39.9 42.1 41.6 37.0  MCV 89.4 89.3 89.2 86.8 86.0  PLT 161 182 217 227 281   Cardiac Enzymes: No results for input(s): CKTOTAL, CKMB, CKMBINDEX, TROPONINI in the last 168 hours. BNP: BNP (last 3 results)  Recent Labs  11/08/15 0709 01/29/16 1500 09/05/16 0033  BNP 94.0 36.0 36.0    ProBNP (last 3 results) No results for input(s): PROBNP in the last 8760 hours.  CBG:  Recent Labs Lab 09/10/16 2043 09/10/16 2349 09/11/16 0426 09/11/16 0731 09/11/16 1145  GLUCAP 119* 91 103* 83 82       Signed:  Mortimer Bair MD.  [facility] 09/14/2016, 8:36 AM

## 2016-09-14 NOTE — Procedures (Addendum)
Objective Swallowing Evaluation: Type of Study: FEES-Fiberoptic Endoscopic Evaluation of Swallow  Patient Details  Name: Olivia Randall MRN: [id number] Date of Birth: [birth year]  Today's Date: 09/14/2016 Time: SLP Start Time (ACUTE ONLY): 0903-SLP Stop Time (ACUTE ONLY): 0928 SLP Time Calculation (min) (ACUTE ONLY): 25 min  Past Medical History:  Past Medical History:  Diagnosis Date  . Anxiety and depression   . Asthma   . Chronic back pain   . Chronic neck pain   . COPD (chronic obstructive pulmonary disease) ([place])   . 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 Depression   . GERD (gastroesophageal reflux disease)   . Hypertension   . MS (multiple sclerosis) ([place])   . MS (multiple sclerosis) ([place])    staes xrays showed signs of ms  . On home O2    2L N/C  . Shortness of breath dyspnea   . Sleep apnea    Past Surgical History:  Past Surgical History:  Procedure Laterality Date  . ABDOMINAL HYSTERECTOMY    . BIOPSY  04/24/2015   Procedure: BIOPSY (Gastric);  Surgeon: [name], MD;  Location: [facility];  Service: Endoscopy;;  . BLADDER SURGERY    . BREAST SURGERY    . CARPAL TUNNEL RELEASE    . CHOLECYSTECTOMY    . ESOPHAGOGASTRODUODENOSCOPY  2009   Dr. Laural Golden: small sliding hiatal hernia with mild reflux esophagitis, empiric dilation with 54/56 F, nonerosive antral gastritis   . ESOPHAGOGASTRODUODENOSCOPY (EGD) WITH PROPOFOL N/A 04/24/2015   RMR: normal esophagus status post Maloney dilation. 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   [name] HEMORRHOID SURGERY    . MALONEY DILATION N/A 04/24/2015   Procedure: MALONEY ESOPHAGEAL DILATION (54FR);  Surgeon: [name], MD;  Location: [facility];  Service: Endoscopy;  Laterality: N/A;  . NECK SURGERY    . SHOULDER SURGERY Right    HPI: 66 year old woman with COPD on 3 L home oxygen and multiple sclerosis was brought in by [facility] emergency department for respiratory distress. EMS found  her oxygen saturation to be 74% on arrival. She had called EMS twice in the same day. She was noted to have acute respiratory acidosis on ABG, failed BiPAP and continues nebs and was eventually intubated. She was then transferred to ICU at [facility]. She is on high dose xanax and narcotics at home. Also, dementia medications. Intubated from 12/24 to 12/29. Started on diet 12/30.   Subjective: pt remains aphonic, denies trouble with swallowing but is nervous about aspiration   Assessment / Plan / Recommendation  CHL IP CLINICAL IMPRESSIONS 09/14/2016  Therapy Diagnosis Mild pharyngeal phase dysphagia    Clinical Impression Pt has a mild pharyngeal dysphagia s/p intubation but with no aspiration observed. She has some mild, diffuse erythema and incomplete glottal closure. While her left true vocal fold appears to move more than the right, she does have some bilateral movement. Bilateral lesions are noted on the posterior 1/3 of her true vocal folds, likely indicative of where the ET tube was positioned. She has a delay in swallow trigger with thin liquids reaching the pyriform sinuses. Mild, generalized weakness of her pharynx leads to residuals that are more present with solids, but are reduced with Min cues for a second swallow. Recommend to continue with a regular consistency diet and thin liquids with use of double swallows and aspiration precautions. Pt may wish to f/u with ENT as an OP if she does not have  improvement with her phonation.    Impact on safety and function Mild aspiration risk      CHL IP TREATMENT RECOMMENDATION 09/14/2016  Treatment Recommendations No treatment recommended at this time     No flowsheet data found.  CHL IP DIET RECOMMENDATION 09/14/2016  SLP Diet Recommendations Regular solids;Thin liquid  Liquid Administration via Cup;Straw  Medication Administration Whole meds with liquid  Compensations Slow rate;Small sips/bites;Multiple dry swallows after each  bite/sip;Follow solids with liquid  Postural Changes Remain semi-upright after after feeds/meals (Comment);Seated upright at 90 degrees      CHL IP OTHER RECOMMENDATIONS 09/14/2016  Recommended Consults Consider ENT evaluation  Oral Care Recommendations Oral care BID  Other Recommendations --      CHL IP FOLLOW UP RECOMMENDATIONS 09/14/2016  Follow up Recommendations 24 hour supervision/assistance      No flowsheet data found.         CHL IP ORAL PHASE 09/14/2016  Oral Phase WFL  Oral - Pudding Teaspoon --  Oral - Pudding Cup --  Oral - Honey Teaspoon --  Oral - Honey Cup --  Oral - Nectar Teaspoon --  Oral - Nectar Cup --  Oral - Nectar Straw --  Oral - Thin Teaspoon --  Oral - Thin Cup --  Oral - Thin Straw --  Oral - Puree --  Oral - Mech Soft --  Oral - Regular --  Oral - Multi-Consistency --  Oral - Pill --  Oral Phase - Comment --    CHL IP PHARYNGEAL PHASE 09/14/2016  Pharyngeal Phase Impaired  Pharyngeal- Pudding Teaspoon --  Pharyngeal --  Pharyngeal- Pudding Cup --  Pharyngeal --  Pharyngeal- Honey Teaspoon --  Pharyngeal --  Pharyngeal- Honey Cup --  Pharyngeal --  Pharyngeal- Nectar Teaspoon --  Pharyngeal --  Pharyngeal- Nectar Cup --  Pharyngeal --  Pharyngeal- Nectar Straw --  Pharyngeal --  Pharyngeal- Thin Teaspoon Delayed swallow initiation-pyriform sinuses;Reduced pharyngeal peristalsis;Reduced tongue base retraction;Reduced anterior laryngeal mobility;Reduced laryngeal elevation;Pharyngeal residue - valleculae;Lateral channel residue;Pharyngeal residue - pyriform;Other (Comment)  Pharyngeal --  Pharyngeal- Thin Cup --  Pharyngeal --  Pharyngeal- Thin Straw Delayed swallow initiation-pyriform sinuses;Reduced pharyngeal peristalsis;Reduced tongue base retraction;Reduced anterior laryngeal mobility;Reduced laryngeal elevation;Pharyngeal residue - valleculae;Lateral channel residue;Pharyngeal residue - pyriform  Pharyngeal --  Pharyngeal- Puree  Reduced pharyngeal peristalsis;Reduced tongue base retraction;Reduced anterior laryngeal mobility;Reduced laryngeal elevation;Pharyngeal residue - valleculae;Lateral channel residue;Pharyngeal residue - pyriform;Delayed swallow initiation-vallecula  Pharyngeal --  Pharyngeal- Mechanical Soft --  Pharyngeal --  Pharyngeal- Regular Reduced pharyngeal peristalsis;Reduced tongue base retraction;Reduced anterior laryngeal mobility;Reduced laryngeal elevation;Pharyngeal residue - valleculae;Lateral channel residue;Pharyngeal residue - pyriform;Delayed swallow initiation-vallecula  Pharyngeal --  Pharyngeal- Multi-consistency --  Pharyngeal --  Pharyngeal- Pill --  Pharyngeal --  Pharyngeal Comment --     CHL IP CERVICAL ESOPHAGEAL PHASE 09/14/2016  Cervical Esophageal Phase WFL  Pudding Teaspoon --  Pudding Cup --  Honey Teaspoon --  Honey Cup --  Nectar Teaspoon --  Nectar Cup --  Nectar Straw --  Thin Teaspoon --  Thin Cup --  Thin Straw --  Puree --  Mechanical Soft --  Regular --  Multi-consistency --  Pill --  Cervical Esophageal Comment --    No flowsheet data found.  Germain Osgood 09/14/2016, 9:46 AM   Germain Osgood, M.A. CCC-SLP [phone]

## 2016-09-30 ENCOUNTER — Other Ambulatory Visit: Payer: Self-pay | Admitting: Physician Assistant

## 2016-10-12 ENCOUNTER — Other Ambulatory Visit: Payer: Self-pay | Admitting: Physician Assistant

## 2016-10-26 ENCOUNTER — Encounter (HOSPITAL_COMMUNITY): Payer: Self-pay | Admitting: *Deleted

## 2016-10-26 ENCOUNTER — Emergency Department (HOSPITAL_COMMUNITY)
Admission: EM | Admit: 2016-10-26 | Discharge: 2016-10-26 | Disposition: A | Discharge status: Home / Self Care | Payer: Medicare HMO | Attending: Emergency Medicine | Admitting: Emergency Medicine

## 2016-10-26 ENCOUNTER — Emergency Department (HOSPITAL_COMMUNITY): Payer: Medicare HMO

## 2016-10-26 DIAGNOSIS — I10 Essential (primary) hypertension: Secondary | ICD-10-CM | POA: Insufficient documentation

## 2016-10-26 DIAGNOSIS — J45909 Unspecified asthma, uncomplicated: Secondary | ICD-10-CM | POA: Diagnosis not present

## 2016-10-26 DIAGNOSIS — J441 Chronic obstructive pulmonary disease with (acute) exacerbation: Secondary | ICD-10-CM | POA: Diagnosis not present

## 2016-10-26 DIAGNOSIS — Z79899 Other long term (current) drug therapy: Secondary | ICD-10-CM | POA: Diagnosis not present

## 2016-10-26 DIAGNOSIS — R0602 Shortness of breath: Secondary | ICD-10-CM | POA: Diagnosis present

## 2016-10-26 DIAGNOSIS — Z87891 Personal history of nicotine dependence: Secondary | ICD-10-CM | POA: Diagnosis not present

## 2016-10-26 LAB — CBC WITH DIFFERENTIAL/PLATELET
Basophils Absolute: 0 10*3/uL (ref 0.0–0.1)
Basophils Relative: 0 %
Eosinophils Absolute: 0.2 10*3/uL (ref 0.0–0.7)
Eosinophils Relative: 2 %
HEMATOCRIT: 40.8 % (ref 36.0–46.0)
HEMOGLOBIN: 12.3 g/dL (ref 12.0–15.0)
LYMPHS ABS: 0.6 10*3/uL — AB (ref 0.7–4.0)
LYMPHS PCT: 6 %
MCH: 26.4 pg (ref 26.0–34.0)
MCHC: 30.1 g/dL (ref 30.0–36.0)
MCV: 87.6 fL (ref 78.0–100.0)
Monocytes Absolute: 0.2 10*3/uL (ref 0.1–1.0)
Monocytes Relative: 2 %
NEUTROS PCT: 90 %
Neutro Abs: 9.1 10*3/uL — ABNORMAL HIGH (ref 1.7–7.7)
Platelets: 190 10*3/uL (ref 150–400)
RBC: 4.66 MIL/uL (ref 3.87–5.11)
RDW: 15.2 % (ref 11.5–15.5)
WBC: 10 10*3/uL (ref 4.0–10.5)

## 2016-10-26 LAB — BASIC METABOLIC PANEL
ANION GAP: 6 (ref 5–15)
BUN: 13 mg/dL (ref 6–20)
CHLORIDE: 98 mmol/L — AB (ref 101–111)
CO2: 34 mmol/L — ABNORMAL HIGH (ref 22–32)
Calcium: 9.6 mg/dL (ref 8.9–10.3)
Creatinine, Ser: 0.64 mg/dL (ref 0.44–1.00)
GFR calc Af Amer: >60 mL/min (ref >60)
GLUCOSE: 140 mg/dL — AB (ref 65–99)
POTASSIUM: 3.7 mmol/L (ref 3.5–5.1)
Sodium: 138 mmol/L (ref 135–145)

## 2016-10-26 LAB — BRAIN NATRIURETIC PEPTIDE: B NATRIURETIC PEPTIDE 5: 11 pg/mL (ref 0.0–100.0)

## 2016-10-26 LAB — TROPONIN I: Troponin I: <0.03 ng/mL (ref <0.03)

## 2016-10-26 MED ORDER — IPRATROPIUM-ALBUTEROL 0.5-2.5 (3) MG/3ML IN SOLN
3.0000 mL | Freq: Once | RESPIRATORY_TRACT | Status: AC
  Administered 2016-10-26: 3 mL via RESPIRATORY_TRACT
  Filled 2016-10-26: qty 3

## 2016-10-26 MED ORDER — ALBUTEROL SULFATE HFA 108 (90 BASE) MCG/ACT IN AERS
2.0000 | INHALATION_SPRAY | RESPIRATORY_TRACT | Status: DC | PRN
  Filled 2016-10-26: qty 6.7

## 2016-10-26 MED ORDER — PREDNISONE 20 MG PO TABS: 40.0000 mg | ORAL_TABLET | Freq: Every day | ORAL | 0 refills | Status: DC

## 2016-10-26 MED ORDER — DOXYCYCLINE HYCLATE 100 MG PO CAPS: 100.0000 mg | ORAL_CAPSULE | Freq: Two times a day (BID) | ORAL | 0 refills | Status: DC

## 2016-10-26 MED ORDER — ALBUTEROL SULFATE HFA 108 (90 BASE) MCG/ACT IN AERS: 2.0000 | INHALATION_SPRAY | RESPIRATORY_TRACT | 0 refills | Status: DC | PRN

## 2016-10-26 NOTE — ED Provider Notes (Signed)
[facility] DEPT Provider Note   CSN: [id number] Arrival date & time: 10/26/16  0407     History   Chief Complaint Chief Complaint  Patient presents with  . Shortness of Breath    HPI Olivia Randall is a 66 y.o. female.  HPI  This is a 66 year old female with a history of COPD on chronic oxygen, sleep apnea, dementia who presents with shortness of breath. Patient reports that she woke up with worsening shortness of breath. She states that she generally has not felt well. She denies any fever or cough. She states that she initially had chest pressure but that that was relieved with a DuoNeb en route. She states that this feels like her COPD. She currently states that she feels "a little bit better." Denies any history of heart failure or lower extremity any swelling.  Past Medical History:  Diagnosis Date  . Anxiety and depression   . Asthma   . Chronic back pain   . Chronic neck pain   . COPD (chronic obstructive pulmonary disease) ([place])   . 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 Depression   . GERD (gastroesophageal reflux disease)   . Hypertension   . MS (multiple sclerosis) ([place])   . MS (multiple sclerosis) ([place])    staes xrays showed signs of ms  . On home O2    2L N/C  . Shortness of breath dyspnea   . Sleep apnea     Patient Active Problem List   Diagnosis Date Noted  . Acute respiratory failure with hypercapnia ([place]) 09/05/2016  . HTN (hypertension) 11/08/2015  . Mucosal abnormality of stomach   . Acute on chronic respiratory failure ([place]) 03/18/2015  . Hyponatremia 03/18/2015  . Sleep apnea   . Anxiety and depression   . COPD exacerbation ([place]) 03/17/2015  . Dysphagia, pharyngoesophageal phase 02/18/2015  . OSA (obstructive sleep apnea) 05/20/2013  . Tobacco use disorder 05/20/2013  . Dizzy 12/04/2012  . COPD with acute exacerbation ([place]) 12/03/2012  . Chronic respiratory failure ([place]) 12/03/2012  . Hypokalemia 12/03/2012  . Chronic back  pain 12/03/2012  . GERD (gastroesophageal reflux disease) 12/03/2012    Past Surgical History:  Procedure Laterality Date  . ABDOMINAL HYSTERECTOMY    . BIOPSY  04/24/2015   Procedure: BIOPSY (Gastric);  Surgeon: [name], MD;  Location: [facility];  Service: Endoscopy;;  . BLADDER SURGERY    . BREAST SURGERY    . CARPAL TUNNEL RELEASE    . CHOLECYSTECTOMY    . ESOPHAGOGASTRODUODENOSCOPY  2009   Dr. Laural Golden: small sliding hiatal hernia with mild reflux esophagitis, empiric dilation with 54/56 F, nonerosive antral gastritis   . ESOPHAGOGASTRODUODENOSCOPY (EGD) WITH PROPOFOL N/A 04/24/2015   RMR: normal esophagus status post Maloney dilation. 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   [name] HEMORRHOID SURGERY    . MALONEY DILATION N/A 04/24/2015   Procedure: MALONEY ESOPHAGEAL DILATION (54FR);  Surgeon: [name], MD;  Location: [facility];  Service: Endoscopy;  Laterality: N/A;  . NECK SURGERY    . SHOULDER SURGERY Right     OB History    Gravida Para Term Preterm AB Living   '1 1 1         '$ SAB TAB Ectopic Multiple Live Births                   Home Medications    Prior to Admission medications   Medication Sig Start  Date End Date Taking? Authorizing Provider  ALPRAZolam Duanne Moron) 1 MG tablet Take 1 mg by mouth 4 (four) times daily as needed for anxiety.     Historical Provider, MD  clopidogrel (PLAVIX) 75 MG tablet Take 75 mg by mouth daily.    Historical Provider, MD  cyclobenzaprine (FLEXERIL) 10 MG tablet Take 10 mg by mouth at bedtime.    Historical Provider, MD  donepezil (ARICEPT) 10 MG tablet Take 10 mg by mouth at bedtime.  11/04/14   Historical Provider, MD  ENSURE (ENSURE) Take 237 mLs by mouth See admin instructions. Drink 1 can (237 mls) by mouth one to two times daily    Historical Provider, MD  esomeprazole (NEXIUM) 40 MG capsule Take 40 mg by mouth daily. 11/04/14   Historical Provider, MD  guaiFENesin-codeine (ROBITUSSIN AC) 100-10  MG/5ML syrup Take 5-10 mLs by mouth every 6 (six) hours as needed for cough or congestion.    Historical Provider, MD  ipratropium-albuterol (DUONEB) 0.5-2.5 (3) MG/3ML SOLN Take 3 mLs by nebulization every 4 (four) hours as needed (shortness of breath/wheezing).    Historical Provider, MD  lisinopril (PRINIVIL,ZESTRIL) 10 MG tablet TAKE ONE (1) TABLET EACH DAY Patient not taking: Reported on 09/05/2016 06/14/16   Terald Sleeper, PA-C  loratadine (CLARITIN) 10 MG tablet Take 10 mg by mouth daily.     Historical Provider, MD  OXYGEN Inhale 2 L into the lungs continuous.    Historical Provider, MD  PARoxetine (PAXIL) 40 MG tablet Take 40 mg by mouth daily.     Historical Provider, MD  potassium chloride (K-DUR) 10 MEQ tablet Take 10 mEq by mouth daily.  02/22/15   Historical Provider, MD  predniSONE (DELTASONE) 20 MG tablet Take 1-2 tablets (20-40 mg total) by mouth daily with breakfast. Take '40mg'$  for 2days then '20mg'$  for 2days then '10mg'$  for 2days then STOP 09/14/16   Domenic Polite, MD  PRESCRIPTION MEDICATION Take 5-10 mLs by mouth every 4 (four) hours as needed (cough). Prescription cough syrup    Historical Provider, MD  PROAIR HFA 108 [phone] Base) MCG/ACT inhaler INHALE 2 PUFFS EVERY 4 HOURS AS NEEDED 10/01/16   Terald Sleeper, PA-C  SPIRIVA HANDIHALER 18 MCG inhalation capsule USE 1 PUFF DAILY 10/01/16   Terald Sleeper, PA-C    Family History Family History  Problem Relation Age of Onset  . Diabetes Mother   . Diabetes Father   . Diabetes Brother   . Colon cancer Neg Hx     Social History Social History  Substance Use Topics  . Smoking status: Former Smoker    Types: Cigarettes  . Smokeless tobacco: Never Used     Comment: 3-4 cigarettes a day  . Alcohol use No     Allergies   Patient has no known allergies.   Review of Systems Review of Systems  Constitutional: Negative for chills and fever.  Respiratory: Positive for shortness of breath. Negative for cough.   Cardiovascular:  Positive for chest pain. Negative for leg swelling.  Gastrointestinal: Negative for abdominal pain, nausea and vomiting.  All other systems reviewed and are negative.    Physical Exam Updated Vital Signs BP 112/91 (BP Location: Left Arm)   Pulse 85   Temp 97.8 F (36.6 C) (Oral)   Resp 20   Ht '5\' 6"'$  (1.676 m)   Wt 103 lb (46.7 kg)   SpO2 98%   BMI 16.62 kg/m   Physical Exam  Constitutional: She is oriented to person, place,  and time.  Frail, thin, chronically ill-appearing  HENT:  Head: Normocephalic and atraumatic.  Cardiovascular: Normal rate, regular rhythm and normal heart sounds.   Pulmonary/Chest: Effort normal. No respiratory distress.  Poor air movement, nasal cannula in place, no accessory muscle use or respiratory distress noted  Abdominal: Soft. Bowel sounds are normal. There is no tenderness. There is no guarding.  Musculoskeletal: She exhibits no edema.  Neurological: She is alert and oriented to person, place, and time.  Skin: Skin is warm and dry.  Psychiatric: She has a normal mood and affect.  Nursing note and vitals reviewed.    ED Treatments / Results  Labs (all labs ordered are listed, but only abnormal results are displayed) Labs Reviewed  CBC WITH DIFFERENTIAL/PLATELET - Abnormal; Notable for the following:       Result Value   Neutro Abs 9.1 (*)    Lymphs Abs 0.6 (*)    All other components within normal limits  BASIC METABOLIC PANEL - Abnormal; Notable for the following:    Chloride 98 (*)    CO2 34 (*)    Glucose, Bld 140 (*)    All other components within normal limits  BRAIN NATRIURETIC PEPTIDE  TROPONIN I    EKG  EKG Interpretation  Date/Time:  Tuesday October 26 2016 04:17:17 EST Ventricular Rate:  89 PR Interval:    QRS Duration: 92 QT Interval:  372 QTC Calculation: 451 R Axis:   76 Text Interpretation:  Sinus rhythm Confirmed by Dina Rich  MD, Sobia Karger ([id number]) on 10/26/2016 6:10:25 AM       Radiology Dg Chest 2  View  Result Date: 10/26/2016 CLINICAL DATA:  Shortness of breath. Nonproductive cough. Symptoms for couple of days. EXAM: CHEST  2 VIEW COMPARISON:  10/14/2016 FINDINGS: Emphysematous changes in the lungs. Central interstitial pattern suggesting chronic bronchitis. Blunting of the right costophrenic angle consistent with small effusion or pleural thickening and with basilar scarring or atelectasis, similar to prior study. No focal consolidation or developing infiltrates. No pneumothorax. Calcified and tortuous aorta. Degenerative changes in the spine. Anterior compression of a mid thoracic vertebra without change. Postoperative change in the cervical spine and right upper quadrant. IMPRESSION: Unchanged appearance since prior study. Emphysema and chronic bronchitic changes. Small right pleural effusion or thickening with basilar scarring or atelectasis. Electronically Signed   By: Lucienne Capers M.D.   On: 10/26/2016 05:20    Procedures Procedures (including critical care time)  Medications Ordered in ED Medications  ipratropium-albuterol (DUONEB) 0.5-2.5 (3) MG/3ML nebulizer solution 3 mL (3 mLs Nebulization Given 10/26/16 0519)  ipratropium-albuterol (DUONEB) 0.5-2.5 (3) MG/3ML nebulizer solution 3 mL (3 mLs Nebulization Given 10/26/16 0641)     Initial Impression / Assessment and Plan / ED Course  I have reviewed the triage vital signs and the nursing notes.  Pertinent labs & imaging results that were available during my care of the patient were reviewed by me and considered in my medical decision making (see chart for details).     Patient presents with shortness of breath. History of COPD on home oxygen. Reports improvement of symptoms after treatment by EMS. She did receive steroids in route. On my exam, she has poor air movement with only scant wheezing. She was given an additional DuoNeb. Chest x-ray shows no evidence of pneumonia. Lab workup an EKG is reassuring. She continues to  report progressive improvement.  6:43 AM Patient with better air movement and more significant wheezing. She states that she feels much better. Will  order one additional DuoNeb and treat as a COPD exacerbation. Anticipate discharge home.  After history, exam, and medical workup I feel the patient has been appropriately medically screened and is safe for discharge home. Pertinent diagnoses were discussed with the patient. Patient was given return precautions.   Final Clinical Impressions(s) / ED Diagnoses   Final diagnoses:  COPD exacerbation [facility])    New Prescriptions New Prescriptions   No medications on file     Merryl Hacker, MD 10/26/16 [phone]

## 2016-10-26 NOTE — ED Triage Notes (Signed)
Pt brought in by [facility] for c/o sob that woke her up suddenly; pt states she has had a non productive cough for the last couple of days; pt was given atrovent and albuterol en route and '125mg'$  soulmedrol IV

## 2016-10-26 NOTE — ED Notes (Signed)
Pt states she is feeling a little better since breathing treatment

## 2016-10-26 NOTE — ED Notes (Addendum)
Pt awaiting transportation home at this time. Pt attempting to contact husband.

## 2016-10-29 ENCOUNTER — Other Ambulatory Visit: Payer: Self-pay | Admitting: Physician Assistant

## 2016-11-03 ENCOUNTER — Telehealth: Payer: Self-pay

## 2016-11-08 NOTE — Telephone Encounter (Signed)
x

## 2016-11-18 ENCOUNTER — Other Ambulatory Visit: Payer: Self-pay | Admitting: Physician Assistant

## 2016-11-19 ENCOUNTER — Other Ambulatory Visit: Payer: Self-pay | Admitting: Physician Assistant

## 2016-11-30 ENCOUNTER — Emergency Department (HOSPITAL_COMMUNITY)
Admission: EM | Admit: 2016-11-30 | Discharge: 2016-11-30 | Disposition: A | Discharge status: Home / Self Care | Payer: Medicare HMO | Attending: Emergency Medicine | Admitting: Emergency Medicine

## 2016-11-30 ENCOUNTER — Emergency Department (HOSPITAL_COMMUNITY): Payer: Medicare HMO

## 2016-11-30 ENCOUNTER — Encounter (HOSPITAL_COMMUNITY): Payer: Self-pay | Admitting: Emergency Medicine

## 2016-11-30 DIAGNOSIS — Z79899 Other long term (current) drug therapy: Secondary | ICD-10-CM | POA: Insufficient documentation

## 2016-11-30 DIAGNOSIS — J449 Chronic obstructive pulmonary disease, unspecified: Secondary | ICD-10-CM | POA: Insufficient documentation

## 2016-11-30 DIAGNOSIS — Z87891 Personal history of nicotine dependence: Secondary | ICD-10-CM | POA: Diagnosis not present

## 2016-11-30 DIAGNOSIS — J45909 Unspecified asthma, uncomplicated: Secondary | ICD-10-CM | POA: Diagnosis not present

## 2016-11-30 DIAGNOSIS — J441 Chronic obstructive pulmonary disease with (acute) exacerbation: Secondary | ICD-10-CM

## 2016-11-30 DIAGNOSIS — R0602 Shortness of breath: Secondary | ICD-10-CM | POA: Diagnosis present

## 2016-11-30 DIAGNOSIS — I10 Essential (primary) hypertension: Secondary | ICD-10-CM | POA: Insufficient documentation

## 2016-11-30 LAB — CBC WITH DIFFERENTIAL/PLATELET
BASOS ABS: 0 10*3/uL (ref 0.0–0.1)
BASOS PCT: 0 %
Eosinophils Absolute: 0.2 10*3/uL (ref 0.0–0.7)
Eosinophils Relative: 3 %
HEMATOCRIT: 38 % (ref 36.0–46.0)
HEMOGLOBIN: 11.9 g/dL — AB (ref 12.0–15.0)
Lymphocytes Relative: 16 %
Lymphs Abs: 1.3 10*3/uL (ref 0.7–4.0)
MCH: 27.5 pg (ref 26.0–34.0)
MCHC: 31.3 g/dL (ref 30.0–36.0)
MCV: 88 fL (ref 78.0–100.0)
MONO ABS: 0.9 10*3/uL (ref 0.1–1.0)
Monocytes Relative: 11 %
NEUTROS ABS: 5.8 10*3/uL (ref 1.7–7.7)
NEUTROS PCT: 70 %
PLATELETS: 186 10*3/uL (ref 150–400)
RBC: 4.32 MIL/uL (ref 3.87–5.11)
RDW: 15.2 % (ref 11.5–15.5)
WBC: 8.2 10*3/uL (ref 4.0–10.5)

## 2016-11-30 LAB — BASIC METABOLIC PANEL
ANION GAP: 6 (ref 5–15)
BUN: 9 mg/dL (ref 6–20)
CO2: 35 mmol/L — ABNORMAL HIGH (ref 22–32)
Calcium: 9.5 mg/dL (ref 8.9–10.3)
Chloride: 96 mmol/L — ABNORMAL LOW (ref 101–111)
Creatinine, Ser: 0.62 mg/dL (ref 0.44–1.00)
Glucose, Bld: 95 mg/dL (ref 65–99)
Potassium: 3.4 mmol/L — ABNORMAL LOW (ref 3.5–5.1)
SODIUM: 137 mmol/L (ref 135–145)

## 2016-11-30 LAB — TROPONIN I

## 2016-11-30 MED ORDER — PREDNISONE 10 MG (21) PO TBPK: ORAL_TABLET | ORAL | 0 refills | Status: DC

## 2016-11-30 MED ORDER — IPRATROPIUM-ALBUTEROL 0.5-2.5 (3) MG/3ML IN SOLN
3.0000 mL | Freq: Once | RESPIRATORY_TRACT | Status: AC
  Administered 2016-11-30: 3 mL via RESPIRATORY_TRACT
  Filled 2016-11-30: qty 3

## 2016-11-30 MED ORDER — AEROCHAMBER PLUS FLO-VU MEDIUM MISC
1.0000 | Freq: Once | Status: AC
  Administered 2016-11-30: 1
  Filled 2016-11-30: qty 1

## 2016-11-30 MED ORDER — METHYLPREDNISOLONE SODIUM SUCC 125 MG IJ SOLR
125.0000 mg | Freq: Once | INTRAMUSCULAR | Status: AC
  Administered 2016-11-30: 125 mg via INTRAVENOUS
  Filled 2016-11-30: qty 2

## 2016-11-30 NOTE — ED Triage Notes (Signed)
Pt visiting spouse on med-surgical floor. Per med-surg staff, pt had sudden onset shortness of breath after eating dinner. Hospitalist assessed patient and sent pt to ER for evaluation. Pt denies pain. Moderate dyspnea noted in triage.

## 2016-11-30 NOTE — ED Notes (Signed)
Patient was discharged and transported via wheelchair to husband's admission room 333, no distress noted, vital signs.

## 2016-11-30 NOTE — ED Provider Notes (Signed)
[facility] DEPT Provider Note   CSN: [id number] Arrival date & time: 11/30/16  1830     History   Chief Complaint Chief Complaint  Patient presents with  . Shortness of Breath    HPI Olivia Randall is a 66 y.o. female.  Pt presents to the ED today with SOB.  Her husband has been admitted with severe diarrhea.  She has been staying with him.   She does have COPD and has been on her home oxygen and she's been using her inhalers.  She was feeling fine all day, then after eating dinner, she became sob.      Past Medical History:  Diagnosis Date  . Anxiety and depression   . Asthma   . Chronic back pain   . Chronic neck pain   . COPD (chronic obstructive pulmonary disease) ([place])   . 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 Depression   . GERD (gastroesophageal reflux disease)   . Hypertension   . MS (multiple sclerosis) ([place])   . MS (multiple sclerosis) ([place])    [facility]es xrays showed signs of ms  . On home O2    2L N/C  . Shortness of breath dyspnea   . Sleep apnea     Patient Active Problem List   Diagnosis Date Noted  . Acute respiratory failure with hypercapnia ([place]) 09/05/2016  . HTN (hypertension) 11/08/2015  . Mucosal abnormality of stomach   . Acute on chronic respiratory failure ([place]) 03/18/2015  . Hyponatremia 03/18/2015  . Sleep apnea   . Anxiety and depression   . COPD exacerbation ([place]) 03/17/2015  . Dysphagia, pharyngoesophageal phase 02/18/2015  . OSA (obstructive sleep apnea) 05/20/2013  . Tobacco use disorder 05/20/2013  . Dizzy 12/04/2012  . COPD with acute exacerbation ([place]) 12/03/2012  . Chronic respiratory failure ([place]) 12/03/2012  . Hypokalemia 12/03/2012  . Chronic back pain 12/03/2012  . GERD (gastroesophageal reflux disease) 12/03/2012    Past Surgical History:  Procedure Laterality Date  . ABDOMINAL HYSTERECTOMY    . BIOPSY  04/24/2015   Procedure: BIOPSY (Gastric);  Surgeon: [name], MD;  Location: [facility];   Service: Endoscopy;;  . BLADDER SURGERY    . BREAST SURGERY    . CARPAL TUNNEL RELEASE    . CHOLECYSTECTOMY    . ESOPHAGOGASTRODUODENOSCOPY  2009   Dr. Laural Golden: small sliding hiatal hernia with mild reflux esophagitis, empiric dilation with 54/56 F, nonerosive antral gastritis   . ESOPHAGOGASTRODUODENOSCOPY (EGD) WITH PROPOFOL N/A 04/24/2015   RMR: normal esophagus status post Maloney dilation. 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   [name] HEMORRHOID SURGERY    . MALONEY DILATION N/A 04/24/2015   Procedure: MALONEY ESOPHAGEAL DILATION (54FR);  Surgeon: [name], MD;  Location: [facility];  Service: Endoscopy;  Laterality: N/A;  . NECK SURGERY    . SHOULDER SURGERY Right     OB History    Gravida Para Term Preterm AB Living   '1 1 1         '$ SAB TAB Ectopic Multiple Live Births                   Home Medications    Prior to Admission medications   Medication Sig Start Date End Date Taking? Authorizing Provider  albuterol (PROVENTIL HFA;VENTOLIN HFA) 108 (90 Base) MCG/ACT inhaler Inhale 2 puffs into the lungs every 4 (four) hours as needed for wheezing or shortness of breath.  Yes Historical Provider, MD  ALPRAZolam Duanne Moron) 1 MG tablet Take 1 mg by mouth 4 (four) times daily as needed for anxiety.    Yes Historical Provider, MD  clopidogrel (PLAVIX) 75 MG tablet Take 75 mg by mouth daily.   Yes Historical Provider, MD  cyclobenzaprine (FLEXERIL) 10 MG tablet Take 10 mg by mouth 3 (three) times daily as needed for muscle spasms.    Yes Historical Provider, MD  donepezil (ARICEPT) 10 MG tablet Take 10 mg by mouth at bedtime.    Yes Historical Provider, MD  ENSURE (ENSURE) Take 237 mLs by mouth 2 (two) times daily between meals.    Yes Historical Provider, MD  esomeprazole (NEXIUM) 40 MG capsule Take 40 mg by mouth daily.   Yes Historical Provider, MD  fluticasone-salmeterol (ADVAIR HFA) 230-21 MCG/ACT inhaler Inhale 2 puffs into the lungs 2 (two) times  daily.   Yes Historical Provider, MD  guaiFENesin-codeine (ROBITUSSIN AC) 100-10 MG/5ML syrup Take 5-10 mLs by mouth every 6 (six) hours as needed for cough or congestion.   Yes Historical Provider, MD  ipratropium-albuterol (DUONEB) 0.5-2.5 (3) MG/3ML SOLN Take 3 mLs by nebulization every 6 (six) hours as needed (for wheezing/shortness of breath).    Yes Historical Provider, MD  loratadine (CLARITIN) 10 MG tablet Take 10 mg by mouth daily.    Yes Historical Provider, MD  Oxycodone HCl 20 MG TABS Take 20 mg by mouth every 6 (six) hours as needed (for pain).   Yes Historical Provider, MD  PARoxetine (PAXIL) 40 MG tablet Take 40 mg by mouth daily.    Yes Historical Provider, MD  potassium chloride (K-DUR) 10 MEQ tablet Take 10 mEq by mouth daily.    Yes Historical Provider, MD  tiotropium (SPIRIVA) 18 MCG inhalation capsule Place 18 mcg into inhaler and inhale daily.   Yes Historical Provider, MD  predniSONE (STERAPRED UNI-PAK 21 TAB) 10 MG (21) TBPK tablet Take 6 tabs by mouth daily  for 2 days, then 5 tabs for 2 days, then 4 tabs for 2 days, then 3 tabs for 2 days, 2 tabs for 2 days, then 1 tab by mouth daily for 2 days 11/30/16   [name], MD    Family History Family History  Problem Relation Age of Onset  . Diabetes Mother   . Diabetes Father   . Diabetes Brother   . Colon cancer Neg Hx     Social History Social History  Substance Use Topics  . Smoking status: Former Smoker    Types: Cigarettes  . Smokeless tobacco: Never Used     Comment: 3-4 cigarettes a day  . Alcohol use No     Allergies   Patient has no known allergies.   Review of Systems Review of Systems  Respiratory: Positive for shortness of breath and wheezing.   All other systems reviewed and are negative.    Physical Exam Updated Vital Signs BP 128/82   Pulse 89   Temp 98.4 F (36.9 C) (Oral)   Resp (!) 22   Ht '5\' 5"'$  (1.651 m)   Wt 124 lb (56.2 kg)   SpO2 98%   BMI 20.63 kg/m   Physical Exam    Constitutional: She is oriented to person, place, and time. She appears well-developed. She appears distressed.  HENT:  Head: Normocephalic and atraumatic.  Right Ear: External ear normal.  Left Ear: External ear normal.  Nose: Nose normal.  Mouth/Throat: Oropharynx is clear and moist.  Eyes: Conjunctivae and EOM  are normal. Pupils are equal, round, and reactive to light.  Neck: Normal range of motion. Neck supple.  Cardiovascular: Normal rate, regular rhythm, normal heart sounds and intact distal pulses.   Pulmonary/Chest: Accessory muscle usage present. Tachypnea noted. She has wheezes.  Abdominal: Soft. Bowel sounds are normal.  Musculoskeletal: Normal range of motion.  Neurological: She is alert and oriented to person, place, and time.  Skin: Skin is warm.  Psychiatric: She has a normal mood and affect. Her behavior is normal. Judgment and thought content normal.  Nursing note and vitals reviewed.    ED Treatments / Results  Labs (all labs ordered are listed, but only abnormal results are displayed) Labs Reviewed  CBC WITH DIFFERENTIAL/PLATELET - Abnormal; Notable for the following:       Result Value   Hemoglobin 11.9 (*)    All other components within normal limits  BASIC METABOLIC PANEL  TROPONIN I    EKG  EKG Interpretation  Date/Time:  Tuesday November 30 2016 18:53:42 EDT Ventricular Rate:  87 PR Interval:    QRS Duration: 90 QT Interval:  366 QTC Calculation: 441 R Axis:   77 Text Interpretation:  Sinus rhythm Confirmed by Chantavia Bazzle MD, Vernal Rutan ([id number]) on 11/30/2016 8:40:24 PM       Radiology Dg Chest 2 View  Result Date: 11/30/2016 CLINICAL DATA:  Acute shortness of breath wall eating dinner. History of asthma, COPD, dementia EXAM: CHEST  2 VIEW COMPARISON:  11/28/2016 FINDINGS: Chronic hyperinflation compatible with background COPD/ emphysema. Chronic blunting of right costophrenic angle, suspect pleural scarring. No superimposed pneumonia, collapse or  consolidation. Normal heart size and vascularity. Aorta is atherosclerotic. Degenerative changes of the spine. Bones are osteopenic. Lower cervical fusion hardware noted. IMPRESSION: Chronic COPD/ emphysema of upper and pleura parenchymal scarring as before. No definite superimposed acute process Thoracic aortic atherosclerosis Electronically Signed   By: Jerilynn Mages.  Shick M.D.   On: 11/30/2016 19:25    Procedures Procedures (including critical care time)  Medications Ordered in ED Medications  AEROCHAMBER PLUS FLO-VU MEDIUM MISC 1 each (not administered)  ipratropium-albuterol (DUONEB) 0.5-2.5 (3) MG/3ML nebulizer solution 3 mL (3 mLs Nebulization Given 11/30/16 1925)  methylPREDNISolone sodium succinate (SOLU-MEDROL) 125 mg/2 mL injection 125 mg (125 mg Intravenous Given 11/30/16 1957)     Initial Impression / Assessment and Plan / ED Course  I have reviewed the triage vital signs and the nursing notes.  Pertinent labs & imaging results that were available during my care of the patient were reviewed by me and considered in my medical decision making (see chart for details).    Pt is feeling much better.  She is given a spacer prior to d/c.  She knows to return if worse.   Final Clinical Impressions(s) / ED Diagnoses   Final diagnoses:  COPD exacerbation (HCC)    New Prescriptions New Prescriptions   PREDNISONE (STERAPRED UNI-PAK 21 TAB) 10 MG (21) TBPK TABLET    Take 6 tabs by mouth daily  for 2 days, then 5 tabs for 2 days, then 4 tabs for 2 days, then 3 tabs for 2 days, 2 tabs for 2 days, then 1 tab by mouth daily for 2 days     [name], MD 11/30/16 2040

## 2017-01-23 ENCOUNTER — Encounter (HOSPITAL_COMMUNITY): Payer: Self-pay | Admitting: Family Medicine

## 2017-01-23 ENCOUNTER — Inpatient Hospital Stay (HOSPITAL_COMMUNITY)
Admission: EM | Admit: 2017-01-23 | Discharge: 2017-01-27 | DRG: 190 | Disposition: A | Discharge status: Skilled Nursing Facility | Payer: Medicare HMO | Attending: Internal Medicine | Admitting: Internal Medicine

## 2017-01-23 ENCOUNTER — Emergency Department (HOSPITAL_COMMUNITY): Payer: Medicare HMO

## 2017-01-23 DIAGNOSIS — Z9049 Acquired absence of other specified parts of digestive tract: Secondary | ICD-10-CM | POA: Diagnosis not present

## 2017-01-23 DIAGNOSIS — K219 Gastro-esophageal reflux disease without esophagitis: Secondary | ICD-10-CM | POA: Diagnosis present

## 2017-01-23 DIAGNOSIS — R9431 Abnormal electrocardiogram [ECG] [EKG]: Secondary | ICD-10-CM | POA: Diagnosis not present

## 2017-01-23 DIAGNOSIS — I10 Essential (primary) hypertension: Secondary | ICD-10-CM | POA: Diagnosis present

## 2017-01-23 DIAGNOSIS — M549 Dorsalgia, unspecified: Secondary | ICD-10-CM | POA: Diagnosis present

## 2017-01-23 DIAGNOSIS — G92 Toxic encephalopathy: Secondary | ICD-10-CM | POA: Diagnosis present

## 2017-01-23 DIAGNOSIS — D649 Anemia, unspecified: Secondary | ICD-10-CM | POA: Diagnosis present

## 2017-01-23 DIAGNOSIS — J9622 Acute and chronic respiratory failure with hypercapnia: Secondary | ICD-10-CM | POA: Diagnosis present

## 2017-01-23 DIAGNOSIS — E872 Acidosis: Secondary | ICD-10-CM | POA: Diagnosis present

## 2017-01-23 DIAGNOSIS — J9621 Acute and chronic respiratory failure with hypoxia: Secondary | ICD-10-CM | POA: Diagnosis present

## 2017-01-23 DIAGNOSIS — G35 Multiple sclerosis: Secondary | ICD-10-CM | POA: Diagnosis present

## 2017-01-23 DIAGNOSIS — R4182 Altered mental status, unspecified: Secondary | ICD-10-CM | POA: Diagnosis present

## 2017-01-23 DIAGNOSIS — E871 Hypo-osmolality and hyponatremia: Secondary | ICD-10-CM | POA: Diagnosis present

## 2017-01-23 DIAGNOSIS — I2510 Atherosclerotic heart disease of native coronary artery without angina pectoris: Secondary | ICD-10-CM | POA: Diagnosis present

## 2017-01-23 DIAGNOSIS — Z9981 Dependence on supplemental oxygen: Secondary | ICD-10-CM

## 2017-01-23 DIAGNOSIS — F329 Major depressive disorder, single episode, unspecified: Secondary | ICD-10-CM | POA: Diagnosis present

## 2017-01-23 DIAGNOSIS — F17200 Nicotine dependence, unspecified, uncomplicated: Secondary | ICD-10-CM

## 2017-01-23 DIAGNOSIS — J439 Emphysema, unspecified: Secondary | ICD-10-CM | POA: Diagnosis present

## 2017-01-23 DIAGNOSIS — G894 Chronic pain syndrome: Secondary | ICD-10-CM | POA: Diagnosis present

## 2017-01-23 DIAGNOSIS — R410 Disorientation, unspecified: Secondary | ICD-10-CM

## 2017-01-23 DIAGNOSIS — F0390 Unspecified dementia without behavioral disturbance: Secondary | ICD-10-CM | POA: Diagnosis present

## 2017-01-23 DIAGNOSIS — T402X5A Adverse effect of other opioids, initial encounter: Secondary | ICD-10-CM | POA: Diagnosis present

## 2017-01-23 DIAGNOSIS — Z833 Family history of diabetes mellitus: Secondary | ICD-10-CM | POA: Diagnosis not present

## 2017-01-23 DIAGNOSIS — G9340 Encephalopathy, unspecified: Secondary | ICD-10-CM | POA: Diagnosis not present

## 2017-01-23 DIAGNOSIS — F419 Anxiety disorder, unspecified: Secondary | ICD-10-CM | POA: Diagnosis present

## 2017-01-23 DIAGNOSIS — J441 Chronic obstructive pulmonary disease with (acute) exacerbation: Principal | ICD-10-CM

## 2017-01-23 DIAGNOSIS — G4733 Obstructive sleep apnea (adult) (pediatric): Secondary | ICD-10-CM | POA: Diagnosis present

## 2017-01-23 DIAGNOSIS — Z7951 Long term (current) use of inhaled steroids: Secondary | ICD-10-CM

## 2017-01-23 DIAGNOSIS — J449 Chronic obstructive pulmonary disease, unspecified: Secondary | ICD-10-CM

## 2017-01-23 DIAGNOSIS — R0602 Shortness of breath: Secondary | ICD-10-CM | POA: Diagnosis not present

## 2017-01-23 DIAGNOSIS — Z9119 Patient's noncompliance with other medical treatment and regimen: Secondary | ICD-10-CM

## 2017-01-23 DIAGNOSIS — Z7902 Long term (current) use of antithrombotics/antiplatelets: Secondary | ICD-10-CM

## 2017-01-23 DIAGNOSIS — Z90710 Acquired absence of both cervix and uterus: Secondary | ICD-10-CM

## 2017-01-23 DIAGNOSIS — T424X5A Adverse effect of benzodiazepines, initial encounter: Secondary | ICD-10-CM | POA: Diagnosis present

## 2017-01-23 DIAGNOSIS — J9600 Acute respiratory failure, unspecified whether with hypoxia or hypercapnia: Secondary | ICD-10-CM | POA: Diagnosis present

## 2017-01-23 LAB — CBC WITH DIFFERENTIAL/PLATELET
BASOS ABS: 0 10*3/uL (ref 0.0–0.1)
BASOS PCT: 0 %
EOS ABS: 0.1 10*3/uL (ref 0.0–0.7)
EOS PCT: 1 %
HEMATOCRIT: 38.8 % (ref 36.0–46.0)
Hemoglobin: 10.8 g/dL — ABNORMAL LOW (ref 12.0–15.0)
Lymphocytes Relative: 8 %
Lymphs Abs: 0.8 10*3/uL (ref 0.7–4.0)
MCH: 26 pg (ref 26.0–34.0)
MCHC: 27.8 g/dL — AB (ref 30.0–36.0)
MCV: 93.3 fL (ref 78.0–100.0)
MONO ABS: 0.4 10*3/uL (ref 0.1–1.0)
MONOS PCT: 4 %
NEUTROS ABS: 9.4 10*3/uL — AB (ref 1.7–7.7)
Neutrophils Relative %: 87 %
PLATELETS: 174 10*3/uL (ref 150–400)
RBC: 4.16 MIL/uL (ref 3.87–5.11)
RDW: 14.4 % (ref 11.5–15.5)
WBC: 10.7 10*3/uL — ABNORMAL HIGH (ref 4.0–10.5)

## 2017-01-23 LAB — I-STAT CHEM 8, ED
BUN: 6 mg/dL (ref 6–20)
CALCIUM ION: 1.34 mmol/L (ref 1.15–1.40)
Chloride: 83 mmol/L — ABNORMAL LOW (ref 101–111)
Creatinine, Ser: 0.7 mg/dL (ref 0.44–1.00)
Glucose, Bld: 90 mg/dL (ref 65–99)
HEMATOCRIT: 38 % (ref 36.0–46.0)
Hemoglobin: 12.9 g/dL (ref 12.0–15.0)
Potassium: 3.9 mmol/L (ref 3.5–5.1)
SODIUM: 133 mmol/L — AB (ref 135–145)
TCO2: 44 mmol/L (ref 0–100)

## 2017-01-23 LAB — I-STAT ARTERIAL BLOOD GAS, ED
Acid-Base Excess: 19 mmol/L — ABNORMAL HIGH (ref 0.0–2.0)
Bicarbonate: 49.1 mmol/L — ABNORMAL HIGH (ref 20.0–28.0)
O2 Saturation: 90 %
PCO2 ART: 91.4 mmHg — AB (ref 32.0–48.0)
Patient temperature: 99
pH, Arterial: 7.339 — ABNORMAL LOW (ref 7.350–7.450)
pO2, Arterial: 69 mmHg — ABNORMAL LOW (ref 83.0–108.0)

## 2017-01-23 LAB — I-STAT CG4 LACTIC ACID, ED: LACTIC ACID, VENOUS: 1.25 mmol/L (ref 0.5–1.9)

## 2017-01-23 LAB — COMPREHENSIVE METABOLIC PANEL
ALT: 15 U/L (ref 14–54)
AST: 19 U/L (ref 15–41)
Albumin: 2.8 g/dL — ABNORMAL LOW (ref 3.5–5.0)
Alkaline Phosphatase: 81 U/L (ref 38–126)
Anion gap: 5 (ref 5–15)
CHLORIDE: 86 mmol/L — AB (ref 101–111)
CO2: 42 mmol/L — ABNORMAL HIGH (ref 22–32)
CREATININE: 0.51 mg/dL (ref 0.44–1.00)
Calcium: 10 mg/dL (ref 8.9–10.3)
GFR calc Af Amer: >60 mL/min (ref >60)
Glucose, Bld: 92 mg/dL (ref 65–99)
POTASSIUM: 4 mmol/L (ref 3.5–5.1)
SODIUM: 133 mmol/L — AB (ref 135–145)
Total Bilirubin: 0.5 mg/dL (ref 0.3–1.2)
Total Protein: 6.1 g/dL — ABNORMAL LOW (ref 6.5–8.1)

## 2017-01-23 LAB — I-STAT TROPONIN, ED: TROPONIN I, POC: 0 ng/mL (ref 0.00–0.08)

## 2017-01-23 MED ORDER — IPRATROPIUM-ALBUTEROL 0.5-2.5 (3) MG/3ML IN SOLN
3.0000 mL | Freq: Once | RESPIRATORY_TRACT | Status: AC
  Administered 2017-01-23: 3 mL via RESPIRATORY_TRACT
  Filled 2017-01-23: qty 3

## 2017-01-23 MED ORDER — IPRATROPIUM-ALBUTEROL 0.5-2.5 (3) MG/3ML IN SOLN
3.0000 mL | Freq: Once | RESPIRATORY_TRACT | Status: AC
Start: 2017-01-23 — End: 2017-01-23
  Administered 2017-01-23: 3 mL via RESPIRATORY_TRACT
  Filled 2017-01-23: qty 3

## 2017-01-23 MED ORDER — LORAZEPAM 2 MG/ML IJ SOLN
0.5000 mg | Freq: Once | INTRAMUSCULAR | Status: DC | PRN
  Filled 2017-01-23: qty 1

## 2017-01-23 MED ORDER — METHYLPREDNISOLONE SODIUM SUCC 125 MG IJ SOLR
125.0000 mg | Freq: Once | INTRAMUSCULAR | Status: AC
  Administered 2017-01-23: 125 mg via INTRAVENOUS
  Filled 2017-01-23: qty 2

## 2017-01-23 MED ORDER — MAGNESIUM SULFATE 2 GM/50ML IV SOLN
2.0000 g | Freq: Once | INTRAVENOUS | Status: AC
  Administered 2017-01-24: 2 g via INTRAVENOUS
  Filled 2017-01-23: qty 50

## 2017-01-23 MED ORDER — ALBUTEROL SULFATE (2.5 MG/3ML) 0.083% IN NEBU
2.5000 mg | INHALATION_SOLUTION | Freq: Once | RESPIRATORY_TRACT | Status: AC
  Administered 2017-01-23: 2.5 mg via RESPIRATORY_TRACT
  Filled 2017-01-23: qty 3

## 2017-01-23 NOTE — ED Triage Notes (Signed)
[facility] called out for AMS; EMS got EKG, ST elevation in 3 contiguous leads; EKG transmitted to [facility]; MDs stated for pt to be brought here.  No activation of Code STEMI.  Pt altered from baseline, does not answer appropriately.  GCS of 14 given.  No chest pain, n/v/d.  Stroke screen negative.

## 2017-01-23 NOTE — Progress Notes (Signed)
ABG collected  

## 2017-01-23 NOTE — ED Provider Notes (Signed)
[name] DEPT Provider Note   CSN: [id number] Arrival date & time: 01/23/17  2057     History   Chief Complaint Chief Complaint  Patient presents with  . Altered Mental Status    HPI Olivia Randall is a 66 y.o. female.  Patient brought in by paramedics for confusion. And she did have some chest pain initially   The history is provided by the EMS personnel. No language interpreter was used.  Altered Mental Status   This is a recurrent problem. The current episode started 12 to 24 hours ago. The problem has not changed since onset.Associated symptoms include confusion. Risk factors: COPD. Her past medical history does not include hypertension.    Past Medical History:  Diagnosis Date  . Anxiety and depression   . Asthma   . Chronic back pain   . Chronic neck pain   . COPD (chronic obstructive pulmonary disease) ([place])   . 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 Depression   . GERD (gastroesophageal reflux disease)   . Hypertension   . MS (multiple sclerosis) ([place])   . MS (multiple sclerosis) ([place])    staes xrays showed signs of ms  . On home O2    2L N/C  . Shortness of breath dyspnea   . Sleep apnea     Patient Active Problem List   Diagnosis Date Noted  . Acute respiratory failure with hypercapnia ([place]) 09/05/2016  . HTN (hypertension) 11/08/2015  . Mucosal abnormality of stomach   . Acute on chronic respiratory failure ([place]) 03/18/2015  . Hyponatremia 03/18/2015  . Sleep apnea   . Anxiety and depression   . COPD exacerbation ([place]) 03/17/2015  . Dysphagia, pharyngoesophageal phase 02/18/2015  . OSA (obstructive sleep apnea) 05/20/2013  . Tobacco use disorder 05/20/2013  . Dizzy 12/04/2012  . COPD with acute exacerbation ([place]) 12/03/2012  . Chronic respiratory failure ([place]) 12/03/2012  . Hypokalemia 12/03/2012  . Chronic back pain 12/03/2012  . GERD (gastroesophageal reflux disease) 12/03/2012    Past Surgical History:  Procedure  Laterality Date  . ABDOMINAL HYSTERECTOMY    . BIOPSY  04/24/2015   Procedure: BIOPSY (Gastric);  Surgeon: [name], MD;  Location: [facility];  Service: Endoscopy;;  . BLADDER SURGERY    . BREAST SURGERY    . CARPAL TUNNEL RELEASE    . CHOLECYSTECTOMY    . ESOPHAGOGASTRODUODENOSCOPY  2009   Dr. Laural Golden: small sliding hiatal hernia with mild reflux esophagitis, empiric dilation with 54/56 F, nonerosive antral gastritis   . ESOPHAGOGASTRODUODENOSCOPY (EGD) WITH PROPOFOL N/A 04/24/2015   RMR: normal esophagus status post Maloney dilation. 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   [name] HEMORRHOID SURGERY    . MALONEY DILATION N/A 04/24/2015   Procedure: MALONEY ESOPHAGEAL DILATION (54FR);  Surgeon: [name], MD;  Location: [facility];  Service: Endoscopy;  Laterality: N/A;  . NECK SURGERY    . SHOULDER SURGERY Right     OB History    Gravida Para Term Preterm AB Living   '1 1 1         '$ SAB TAB Ectopic Multiple Live Births                   Home Medications    Prior to Admission medications   Medication Sig Start Date End Date Taking? Authorizing Provider  albuterol (PROVENTIL HFA;VENTOLIN HFA) 108 (90 Base) MCG/ACT inhaler Inhale 2 puffs into the lungs every 4 (  four) hours as needed for wheezing or shortness of breath.    [provider]  ALPRAZolam Duanne Moron) 1 MG tablet Take 1 mg by mouth 4 (four) times daily as needed for anxiety.     [provider]  clopidogrel (PLAVIX) 75 MG tablet Take 75 mg by mouth daily.    [provider]  cyclobenzaprine (FLEXERIL) 10 MG tablet Take 10 mg by mouth 3 (three) times daily as needed for muscle spasms.     [provider]  donepezil (ARICEPT) 10 MG tablet Take 10 mg by mouth at bedtime.     [provider]  ENSURE (ENSURE) Take 237 mLs by mouth 2 (two) times daily between meals.     [provider]  esomeprazole (NEXIUM) 40 MG capsule Take 40 mg by mouth  daily.    [provider]  fluticasone-salmeterol (ADVAIR HFA) 230-21 MCG/ACT inhaler Inhale 2 puffs into the lungs 2 (two) times daily.    [provider]  guaiFENesin-codeine (ROBITUSSIN AC) 100-10 MG/5ML syrup Take 5-10 mLs by mouth every 6 (six) hours as needed for cough or congestion.    [provider]  ipratropium-albuterol (DUONEB) 0.5-2.5 (3) MG/3ML SOLN Take 3 mLs by nebulization every 6 (six) hours as needed (for wheezing/shortness of breath).     [provider]  loratadine (CLARITIN) 10 MG tablet Take 10 mg by mouth daily.     [provider]  Oxycodone HCl 20 MG TABS Take 20 mg by mouth every 6 (six) hours as needed (for pain).    [provider]  PARoxetine (PAXIL) 40 MG tablet Take 40 mg by mouth daily.     [provider]  potassium chloride (K-DUR) 10 MEQ tablet Take 10 mEq by mouth daily.     [provider]  predniSONE (STERAPRED UNI-PAK 21 TAB) 10 MG (21) TBPK tablet Take 6 tabs by mouth daily  for 2 days, then 5 tabs for 2 days, then 4 tabs for 2 days, then 3 tabs for 2 days, 2 tabs for 2 days, then 1 tab by mouth daily for 2 days 11/30/16   [name], MD  tiotropium (SPIRIVA) 18 MCG inhalation capsule Place 18 mcg into inhaler and inhale daily.    [provider]    Family History Family History  Problem Relation Age of Onset  . Diabetes Mother   . Diabetes Father   . Diabetes Brother   . Colon cancer Neg Hx     Social History Social History  Substance Use Topics  . Smoking status: Former Smoker    Types: Cigarettes  . Smokeless tobacco: Never Used     Comment: 3-4 cigarettes a day  . Alcohol use No     Allergies   Patient has no known allergies.   Review of Systems Review of Systems  Unable to perform ROS: Mental status change  Psychiatric/Behavioral: Positive for confusion.     Physical Exam Updated Vital Signs BP 132/61   Pulse 90   Temp 99 F (37.2 C) (Oral)    Resp 16   SpO2 (!) 76%   Physical Exam  Constitutional: She appears well-developed.  HENT:  Head: Normocephalic.  Eyes: Conjunctivae and EOM are normal. No scleral icterus.  Neck: Neck supple. No thyromegaly present.  Cardiovascular: Normal rate and regular rhythm.  Exam reveals no gallop and no friction rub.   No murmur heard. Pulmonary/Chest: No stridor. She has wheezes. She has no rales. She exhibits no tenderness.  Abdominal: She exhibits no distension. There is no tenderness. There is no rebound.  Musculoskeletal: Normal range of motion. She exhibits no edema.  Lymphadenopathy:    She has no cervical adenopathy.  Neurological: She is alert. She exhibits normal muscle tone. Coordination normal.  Oriented to person only.  Skin: No rash noted. No erythema.  Psychiatric: She has a normal mood and affect. Her behavior is normal.     ED Treatments / Results  Labs (all labs ordered are listed, but only abnormal results are displayed) Labs Reviewed  COMPREHENSIVE METABOLIC PANEL - Abnormal; Notable for the following:       Result Value   Sodium 133 (*)    Chloride 86 (*)    CO2 42 (*)    BUN <5 (*)    Total Protein 6.1 (*)    Albumin 2.8 (*)    All other components within normal limits  CBC WITH DIFFERENTIAL/PLATELET - Abnormal; Notable for the following:    WBC 10.7 (*)    Hemoglobin 10.8 (*)    MCHC 27.8 (*)    Neutro Abs 9.4 (*)    All other components within normal limits  I-STAT CHEM 8, ED - Abnormal; Notable for the following:    Sodium 133 (*)    Chloride 83 (*)    All other components within normal limits  I-STAT ARTERIAL BLOOD GAS, ED - Abnormal; Notable for the following:    pH, Arterial 7.339 (*)    pCO2 arterial 91.4 (*)    pO2, Arterial 69.0 (*)    Bicarbonate 49.1 (*)    Acid-Base Excess 19.0 (*)    All other components within normal limits  BLOOD GAS, ARTERIAL  URINALYSIS, ROUTINE W REFLEX MICROSCOPIC  CBG MONITORING, ED  I-STAT TROPOININ, ED    I-STAT CG4 LACTIC ACID, ED    EKG  EKG Interpretation None       Radiology Dg Chest Port 1 View  Result Date: 01/23/2017 CLINICAL DATA:  Acute shortness of breath today. EXAM: PORTABLE CHEST 1 VIEW COMPARISON:  12/11/2016 and prior chest radiographs FINDINGS: The cardiomediastinal silhouette is unchanged. Right basilar atelectasis/scarring again noted. There is no evidence of focal airspace disease, pulmonary edema, suspicious pulmonary nodule/mass, pleural effusion, or pneumothorax. No acute bony abnormalities are identified. Cervical spine surgical hardware again noted. IMPRESSION: No evidence of acute cardiopulmonary disease. Electronically Signed   By: Margarette Canada M.D.   On: 01/23/2017 21:50    Procedures Procedures (including critical care time)  Medications Ordered in ED Medications  magnesium sulfate IVPB 2 g 50 mL (not administered)  methylPREDNISolone sodium succinate (SOLU-MEDROL) 125 mg/2 mL injection 125 mg (125 mg Intravenous Given 01/23/17 2140)  ipratropium-albuterol (DUONEB) 0.5-2.5 (3) MG/3ML nebulizer solution 3 mL (3 mLs Nebulization Given 01/23/17 2140)  albuterol (PROVENTIL) (2.5 MG/3ML) 0.083% nebulizer solution 2.5 mg (2.5 mg Nebulization Given 01/23/17 2140)  ipratropium-albuterol (DUONEB) 0.5-2.5 (3) MG/3ML nebulizer solution 3 mL (3 mLs Nebulization Given 01/23/17 2250)  albuterol (PROVENTIL) (2.5 MG/3ML) 0.083% nebulizer solution 2.5 mg (2.5 mg Nebulization Given 01/23/17 2250)     Initial Impression / Assessment and Plan / ED Course  I have reviewed the triage vital signs and the nursing notes.  Pertinent labs & imaging results that were available during my care of the patient were reviewed by me and considered in my medical decision making (see chart for details).    CRITICAL CARE Performed by: Burns Timson L Total critical care time: 35 minutes Critical care time was exclusive of  separately billable procedures and treating other patients. Critical  care was necessary to treat or prevent imminent or life-threatening deterioration. Critical care was time spent personally by me on the following activities: development of treatment plan with patient and/or surrogate as well as nursing, discussions with consultants, evaluation of patient's response to treatment, examination of patient, obtaining history from patient or surrogate, ordering and performing treatments and interventions, ordering and review of laboratory studies, ordering and review of radiographic studies, pulse oximetry and re-evaluation of patient's condition.   Patient had an EKG that was suggesting a STEMI but her symptoms were not consistent with that I spoke to cardiology on the phone and they felt like the EKG was not changed significantly from her previous EKG and she was not having a STEMI. Patient has bad COPD.  Her blood gas showed that she was retaining CO2. I spoke with critical care who felt like her blood gas wasn't significantly worse than her normal period but she will be started on BiPAP for the confusion she is having because it could be related to the increased CO2. I called medicine to admit the patient and they will admit patient to stepdown. [facility] hospitalist felt like there was no need for critical care to continue following the patient.  I conveyed this to the critical care team and they came down to see the patient  Final Clinical Impressions(s) / ED Diagnoses   Final diagnoses:  COPD exacerbation [facility])    New Prescriptions New Prescriptions   No medications on file     Milton Ferguson, MD 01/23/17 2327

## 2017-01-24 ENCOUNTER — Inpatient Hospital Stay (HOSPITAL_COMMUNITY): Payer: Medicare HMO

## 2017-01-24 ENCOUNTER — Encounter (HOSPITAL_COMMUNITY): Payer: Self-pay | Admitting: Radiology

## 2017-01-24 LAB — BLOOD GAS, ARTERIAL
Acid-Base Excess: 19.6 mmol/L — ABNORMAL HIGH (ref 0.0–2.0)
Bicarbonate: 46.1 mmol/L — ABNORMAL HIGH (ref 20.0–28.0)
DRAWN BY: 437071
Delivery systems: POSITIVE
Expiratory PAP: 4
FIO2: 35
Inspiratory PAP: 10
O2 SAT: 94.1 %
PATIENT TEMPERATURE: 98.9
PCO2 ART: 83.1 mmHg — AB (ref 32.0–48.0)
PO2 ART: 66.1 mmHg — AB (ref 83.0–108.0)
RATE: 10 resp/min
pH, Arterial: 7.364 (ref 7.350–7.450)

## 2017-01-24 LAB — URINALYSIS, ROUTINE W REFLEX MICROSCOPIC
BILIRUBIN URINE: NEGATIVE
GLUCOSE, UA: NEGATIVE mg/dL
Hgb urine dipstick: NEGATIVE
KETONES UR: NEGATIVE mg/dL
LEUKOCYTES UA: NEGATIVE
NITRITE: NEGATIVE
PROTEIN: NEGATIVE mg/dL
Specific Gravity, Urine: 1.011 (ref 1.005–1.030)
pH: 6 (ref 5.0–8.0)

## 2017-01-24 LAB — COMPREHENSIVE METABOLIC PANEL
ALK PHOS: 81 U/L (ref 38–126)
ALT: 14 U/L (ref 14–54)
ANION GAP: 4 — AB (ref 5–15)
AST: 19 U/L (ref 15–41)
Albumin: 3 g/dL — ABNORMAL LOW (ref 3.5–5.0)
BUN: <5 mg/dL — ABNORMAL LOW (ref 6–20)
CALCIUM: 10.2 mg/dL (ref 8.9–10.3)
CO2: 45 mmol/L — AB (ref 22–32)
Chloride: 87 mmol/L — ABNORMAL LOW (ref 101–111)
Creatinine, Ser: 0.58 mg/dL (ref 0.44–1.00)
Glucose, Bld: 132 mg/dL — ABNORMAL HIGH (ref 65–99)
Potassium: 4.5 mmol/L (ref 3.5–5.1)
SODIUM: 136 mmol/L (ref 135–145)
Total Bilirubin: 0.5 mg/dL (ref 0.3–1.2)
Total Protein: 6.2 g/dL — ABNORMAL LOW (ref 6.5–8.1)

## 2017-01-24 LAB — CBC
HCT: 38.3 % (ref 36.0–46.0)
HEMOGLOBIN: 10.9 g/dL — AB (ref 12.0–15.0)
MCH: 26.3 pg (ref 26.0–34.0)
MCHC: 28.5 g/dL — ABNORMAL LOW (ref 30.0–36.0)
MCV: 92.3 fL (ref 78.0–100.0)
Platelets: 172 10*3/uL (ref 150–400)
RBC: 4.15 MIL/uL (ref 3.87–5.11)
RDW: 14.6 % (ref 11.5–15.5)
WBC: 8.5 10*3/uL (ref 4.0–10.5)

## 2017-01-24 LAB — AMMONIA: AMMONIA: 10 umol/L (ref 9–35)

## 2017-01-24 LAB — MRSA PCR SCREENING: MRSA BY PCR: NEGATIVE

## 2017-01-24 LAB — TROPONIN I

## 2017-01-24 MED ORDER — MOMETASONE FURO-FORMOTEROL FUM 200-5 MCG/ACT IN AERO
2.0000 | INHALATION_SPRAY | Freq: Two times a day (BID) | RESPIRATORY_TRACT | Status: DC
  Administered 2017-01-24 – 2017-01-27 (×7): 2 via RESPIRATORY_TRACT
  Filled 2017-01-24: qty 8.8

## 2017-01-24 MED ORDER — METHYLPREDNISOLONE SODIUM SUCC 125 MG IJ SOLR
60.0000 mg | Freq: Four times a day (QID) | INTRAMUSCULAR | Status: DC
  Administered 2017-01-24 – 2017-01-25 (×6): 60 mg via INTRAVENOUS
  Filled 2017-01-24 (×6): qty 2

## 2017-01-24 MED ORDER — SODIUM CHLORIDE 0.9 % IV SOLN
1.5000 g | Freq: Four times a day (QID) | INTRAVENOUS | Status: DC
  Administered 2017-01-24 – 2017-01-27 (×11): 1.5 g via INTRAVENOUS
  Filled 2017-01-24 (×12): qty 1.5

## 2017-01-24 MED ORDER — SODIUM CHLORIDE 0.9 % IV SOLN
INTRAVENOUS | Status: DC
  Administered 2017-01-24: 03:00:00 via INTRAVENOUS

## 2017-01-24 MED ORDER — ONDANSETRON HCL 4 MG PO TABS: 4.0000 mg | ORAL_TABLET | Freq: Four times a day (QID) | ORAL | Status: DC | PRN

## 2017-01-24 MED ORDER — ALBUTEROL SULFATE (2.5 MG/3ML) 0.083% IN NEBU
2.5000 mg | INHALATION_SOLUTION | Freq: Four times a day (QID) | RESPIRATORY_TRACT | Status: DC
  Administered 2017-01-24 (×2): 2.5 mg via RESPIRATORY_TRACT
  Filled 2017-01-24 (×2): qty 3

## 2017-01-24 MED ORDER — SODIUM CHLORIDE 0.9% FLUSH
3.0000 mL | Freq: Two times a day (BID) | INTRAVENOUS | Status: DC
  Administered 2017-01-24 – 2017-01-27 (×8): 3 mL via INTRAVENOUS

## 2017-01-24 MED ORDER — CHLORHEXIDINE GLUCONATE 0.12 % MT SOLN
15.0000 mL | Freq: Two times a day (BID) | OROMUCOSAL | Status: DC
  Administered 2017-01-24 – 2017-01-27 (×6): 15 mL via OROMUCOSAL
  Filled 2017-01-24 (×7): qty 15

## 2017-01-24 MED ORDER — ORAL CARE MOUTH RINSE
15.0000 mL | Freq: Two times a day (BID) | OROMUCOSAL | Status: DC
  Administered 2017-01-24 – 2017-01-27 (×7): 15 mL via OROMUCOSAL

## 2017-01-24 MED ORDER — ALBUTEROL SULFATE (2.5 MG/3ML) 0.083% IN NEBU
2.5000 mg | INHALATION_SOLUTION | Freq: Two times a day (BID) | RESPIRATORY_TRACT | Status: DC
  Administered 2017-01-24 – 2017-01-27 (×6): 2.5 mg via RESPIRATORY_TRACT
  Filled 2017-01-24 (×6): qty 3

## 2017-01-24 MED ORDER — ONDANSETRON HCL 4 MG/2ML IJ SOLN: 4.0000 mg | Freq: Four times a day (QID) | INTRAMUSCULAR | Status: DC | PRN

## 2017-01-24 MED ORDER — TIOTROPIUM BROMIDE MONOHYDRATE 18 MCG IN CAPS
18.0000 ug | ORAL_CAPSULE | Freq: Every day | RESPIRATORY_TRACT | Status: DC
  Administered 2017-01-24 – 2017-01-27 (×4): 18 ug via RESPIRATORY_TRACT
  Filled 2017-01-24: qty 5

## 2017-01-24 MED ORDER — CLOPIDOGREL BISULFATE 75 MG PO TABS
75.0000 mg | ORAL_TABLET | Freq: Every day | ORAL | Status: DC
  Administered 2017-01-24 – 2017-01-26 (×3): 75 mg via ORAL
  Filled 2017-01-24 (×3): qty 1

## 2017-01-24 MED ORDER — OXYCODONE HCL 5 MG PO TABS: 5.0000 mg | ORAL_TABLET | ORAL | Status: DC | PRN

## 2017-01-24 MED ORDER — ACETAMINOPHEN 650 MG RE SUPP: 650.0000 mg | Freq: Four times a day (QID) | RECTAL | Status: DC | PRN

## 2017-01-24 MED ORDER — ENOXAPARIN SODIUM 40 MG/0.4ML ~~LOC~~ SOLN
40.0000 mg | SUBCUTANEOUS | Status: DC
  Administered 2017-01-24 – 2017-01-27 (×4): 40 mg via SUBCUTANEOUS
  Filled 2017-01-24 (×5): qty 0.4

## 2017-01-24 MED ORDER — PAROXETINE HCL 20 MG PO TABS
40.0000 mg | ORAL_TABLET | Freq: Every day | ORAL | Status: DC
  Administered 2017-01-24 – 2017-01-27 (×4): 40 mg via ORAL
  Filled 2017-01-24 (×5): qty 2

## 2017-01-24 MED ORDER — ALBUTEROL SULFATE (2.5 MG/3ML) 0.083% IN NEBU
2.5000 mg | INHALATION_SOLUTION | RESPIRATORY_TRACT | Status: DC | PRN
Start: 2017-01-24 — End: 2017-01-27
  Administered 2017-01-25: 2.5 mg via RESPIRATORY_TRACT
  Filled 2017-01-24: qty 3

## 2017-01-24 MED ORDER — METHYLPREDNISOLONE SODIUM SUCC 125 MG IJ SOLR: 60.0000 mg | Freq: Every day | INTRAMUSCULAR | Status: DC

## 2017-01-24 MED ORDER — ACETAMINOPHEN 325 MG PO TABS
650.0000 mg | ORAL_TABLET | Freq: Four times a day (QID) | ORAL | Status: DC | PRN
  Administered 2017-01-26: 650 mg via ORAL
  Filled 2017-01-24: qty 2

## 2017-01-24 MED ORDER — HYDROCODONE-ACETAMINOPHEN 5-325 MG PO TABS: 1.0000 | ORAL_TABLET | Freq: Four times a day (QID) | ORAL | Status: DC | PRN

## 2017-01-24 MED ORDER — LORAZEPAM 2 MG/ML IJ SOLN
0.5000 mg | INTRAMUSCULAR | Status: DC | PRN
  Administered 2017-01-24: 0.5 mg via INTRAVENOUS
  Filled 2017-01-24: qty 1

## 2017-01-24 NOTE — Progress Notes (Signed)
ANTIBIOTIC CONSULT NOTE - INITIAL  Pharmacy Consult for Unasyn Indication: pneumonia  No Known Allergies  Labs:  Recent Labs  01/23/17 2129 01/23/17 2133 01/23/17 2152 01/24/17 0213  WBC  --  10.7*  --  8.5  HGB  --  10.8* 12.9 10.9*  PLT  --  174  --  172  CREATININE 0.51  --  0.70 0.58   Estimated Creatinine Clearance: 51.7 mL/min (by C-G formula based on SCr of 0.58 mg/dL). No results for input(s): VANCOTROUGH, VANCOPEAK, VANCORANDOM, GENTTROUGH, GENTPEAK, GENTRANDOM, TOBRATROUGH, TOBRAPEAK, TOBRARND, AMIKACINPEAK, AMIKACINTROU, AMIKACIN in the last 72 hours.   Microbiology: Recent Results (from the past 720 hour(s))  MRSA PCR Screening     Status: None   Collection Time: 01/24/17  1:55 AM  Result Value Ref Range Status   MRSA by PCR NEGATIVE NEGATIVE Final    Comment:        The GeneXpert MRSA Assay (FDA approved for NASAL specimens only), is one component of a comprehensive MRSA colonization surveillance program. It is not intended to diagnose MRSA infection nor to guide or monitor treatment for MRSA infections.     Medical History: Past Medical History:  Diagnosis Date  . Anxiety and depression   . Asthma   . Chronic back pain   . Chronic neck pain   . COPD (chronic obstructive pulmonary disease) ([place])   . 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 Depression   . GERD (gastroesophageal reflux disease)   . Hypertension   . MS (multiple sclerosis) ([place])   . MS (multiple sclerosis) ([place])    staes xrays showed signs of ms  . On home O2    2L N/C  . Shortness of breath dyspnea   . Sleep apnea     Assessment: 66 year old female to begin Unasyn for pneumonia  Goal of Therapy:  Appropriate dosing  Plan:  Unasyn 1.5 grams iv Q 6 hours Pharmacy to sign off and follow peripherally for changes in renal function  Thank you Anette Guarneri, PharmD [phone] 01/24/2017,4:00 PM

## 2017-01-24 NOTE — Progress Notes (Signed)
[facility] Progress Note  Patient: Olivia Randall HYW:[id number]   PCP: Octavio Graves, DO DOB: [birth year]   DOA: 01/23/2017   DOS: 01/24/2017   Date of Service: the patient was seen and examined on 01/24/2017  Subjective: Earlier in the morning the patient was nonresponsive and sleepy and drowsy. Later on the patient was responsive, following command, denied any acute complaint. Still lethargic. He received IV lorazepam at 2:25 AM  Brief hospital course: Pt. with PMH of COPD on home O2 with chronic respiratory failure, COPD, MS, dementia, HTN, chronic pain syndrome, chronic anxiety; admitted on 01/23/2017, presented with complaint of unresponsive episode, was found to have acute on chronic hypercarbic respiratory failure likely polypharmacy versus COPD flareup or combination. Currently further plan is continue current treatment.  Assessment and Plan: 1. Acute toxic metabolic encephalopathy. Acute on chronic respiratory failure with hypercapnia. COPD exacerbation. Polypharmacy. Patient presented with complaints of unresponsive episode as well as confusion, found to have hypercarbic respiratory failure acute on chronic. Started on BiPAP. Shows some improvement in her mentation but still not at her baseline. We will get MRI of the brain to rule out any acute abnormality related to her MS. Minimize all psychotropic medication at present. Changing BiPAP to as needed as the patient is tolerating 3 L of oxygen, continue BiPAP daily at bedtime. ABG shows persistent hypercarbia but chronic compensated respiratory acidosis. Continue DuoNeb's, salmeterol, add Unasyn. Chest x-ray unremarkable, CT head unremarkable.  2. Abnormal EKG. coronary disease Patient was found to have an abnormal EKG by EMS, cardiology evaluated the EKG on phone and recommended no indication for code STEMI. Initial troponin negative. We will continue to follow. Echocardiogram. Into new Plavix.  3.  Depression. Namenda. Continue Paxil, holding Aricept. Discontinuing benzodiazepine at present.  4. Chronic pain syndrome. Reportedly the patient is taking 20 mg of OxyIR every 4 hours as needed at home. Currently on oxy 5 milligram every 4 hour as needed, I would change to Norco given patient's mentation and lethargy.  Diet: Soft diet, aspiration precaution. DVT Prophylaxis: subcutaneous Heparin  Advance goals of care discussion: Full code  Family Communication: no family was present at bedside, at the time of interview.  Disposition:   to be determined  Consultants: none Procedures: Echocardiogram  Antibiotics: Anti-infectives    None       Objective: Physical Exam: Vitals:   01/24/17 0724 01/24/17 0758 01/24/17 0930 01/24/17 1425  BP: (!) 151/67 (!) 142/78 124/78 (!) 158/85  Pulse: 91 83 89 89  Resp: (!) 34 (!) 32 (!) 34   Temp:      TempSrc:      SpO2: 100%  98% 96%  Weight:        Intake/Output Summary (Last 24 hours) at 01/24/17 1552 Last data filed at 01/24/17 1400  Gross per 24 hour  Intake           668.75 ml  Output              650 ml  Net            18.75 ml   Filed Weights   01/24/17 0158  Weight: 48 kg (105 lb 12.8 oz)   General: Lethargic, able to follow command, oriented to self  Eyes: PERRL, Conjunctiva normal ENT: Oral Mucosa clear dry. Neck: difficult to assess JVD, no Abnormal Mass Or lumps Cardiovascular: S1 and S2 Present, aortic systolic Murmur, Respiratory: Bilateral Air entry equal and Decreased, no use of accessory muscle, basal Crackles, bilateral  wheezes Abdomen: Bowel Sound present, Soft and no tenderness Skin: no redness, no Rash, no induration Extremities: no Pedal edema, no calf tenderness Neurologic: Grossly no focal neuro deficit. Bilaterally Equal motor strength  Data Reviewed: CBC:  Recent Labs Lab 01/23/17 2133 01/23/17 2152 01/24/17 0213  WBC 10.7*  --  8.5  NEUTROABS 9.4*  --   --   HGB 10.8* 12.9 10.9*   HCT 38.8 38.0 38.3  MCV 93.3  --  92.3  PLT 174  --  962   Basic Metabolic Panel:  Recent Labs Lab 01/23/17 2129 01/23/17 2152 01/24/17 0213  NA 133* 133* 136  K 4.0 3.9 4.5  CL 86* 83* 87*  CO2 42*  --  45*  GLUCOSE 92 90 132*  BUN <5* 6 <5*  CREATININE 0.51 0.70 0.58  CALCIUM 10.0  --  10.2    Liver Function Tests:  Recent Labs Lab 01/23/17 2129 01/24/17 0213  AST 19 19  ALT 15 14  ALKPHOS 81 81  BILITOT 0.5 0.5  PROT 6.1* 6.2*  ALBUMIN 2.8* 3.0*   No results for input(s): LIPASE, AMYLASE in the last 168 hours.  Recent Labs Lab 01/24/17 0213  AMMONIA 10   Coagulation Profile: No results for input(s): INR, PROTIME in the last 168 hours. Cardiac Enzymes:  Recent Labs Lab 01/24/17 0213  TROPONINI <0.03   BNP (last 3 results) No results for input(s): PROBNP in the last 8760 hours. CBG: No results for input(s): GLUCAP in the last 168 hours. Studies: Ct Head Wo Contrast  Result Date: 01/24/2017 CLINICAL DATA:  Confusion. EXAM: CT HEAD WITHOUT CONTRAST TECHNIQUE: Contiguous axial images were obtained from the base of the skull through the vertex without intravenous contrast. COMPARISON:  Head CT 06/26/2014 FINDINGS: Brain: No evidence of acute infarction, hemorrhage, hydrocephalus, extra-axial collection or mass lesion/mass effect. Vascular: No hyperdense vessel or unexpected calcification. Skull: No fracture or focal lesion. Sinuses/Orbits: Paranasal sinuses and mastoid air cells are clear. The visualized orbits are unremarkable. Other: None. IMPRESSION: No acute intracranial abnormality. Electronically Signed   By: Jeb Levering M.D.   On: 01/24/2017 01:39   Dg Chest Port 1 View  Result Date: 01/23/2017 CLINICAL DATA:  Acute shortness of breath today. EXAM: PORTABLE CHEST 1 VIEW COMPARISON:  12/11/2016 and prior chest radiographs FINDINGS: The cardiomediastinal silhouette is unchanged. Right basilar atelectasis/scarring again noted. There is no evidence  of focal airspace disease, pulmonary edema, suspicious pulmonary nodule/mass, pleural effusion, or pneumothorax. No acute bony abnormalities are identified. Cervical spine surgical hardware again noted. IMPRESSION: No evidence of acute cardiopulmonary disease. Electronically Signed   By: Margarette Canada M.D.   On: 01/23/2017 21:50    Scheduled Meds: . albuterol  2.5 mg Nebulization BID  . chlorhexidine  15 mL Mouth Rinse BID  . clopidogrel  75 mg Oral Daily  . enoxaparin (LOVENOX) injection  40 mg Subcutaneous Q24H  . mouth rinse  15 mL Mouth Rinse q12n4p  . methylPREDNISolone (SOLU-MEDROL) injection  60 mg Intravenous Q6H  . mometasone-formoterol  2 puff Inhalation BID  . PARoxetine  40 mg Oral Daily  . sodium chloride flush  3 mL Intravenous Q12H  . tiotropium  18 mcg Inhalation Daily   Continuous Infusions: PRN Meds: acetaminophen **OR** acetaminophen, albuterol, ondansetron **OR** ondansetron (ZOFRAN) IV, oxyCODONE  Time spent: 35 minutes  Author: Berle Mull, MD [facility] Pager: [phone] 01/24/2017 3:52 PM  If 7PM-7AM, please contact night-coverage at [email or web address], password [facility]

## 2017-01-24 NOTE — Progress Notes (Signed)
Patient taken off of bipap and placed on 3L nasal cannula.  Currently tolerating well.  Will continue to monitor.

## 2017-01-24 NOTE — Progress Notes (Signed)
Pt off unit for MRI. Per MRI tech pt is throwing things around and is refusing to get the MRI done, stating she wants to leave.   Before leaving for MRI, pt was educated about what to expect by both myself and the dayshift RN. Pt stated she has done it several times and agreed on going for MRI.

## 2017-01-24 NOTE — H&P (Signed)
History and Physical  Patient Name: Olivia Randall     QQP:[id number]    DOB: Jan 16, 1951    DOA: 01/23/2017 PCP: Octavio Graves, DO   Patient coming from: Home via EMS  Chief Complaint: Confusion  HPI: [name] is a 66 y.o. female with a past medical history significant for dementia, multiple sclerosis, COPD on home O2, CAD, and HTN who presents with confusion.  Caveat that patient is sufficiently altered to be an unreliable historian, most history collected from medics via [place].  EMS was called to the home by someone for confusion. Did not have dyspnea, wheezing, cough, fever, neck stiffness, focal weakness, focal numbness, slurred speech or facial droop.  There are differing reports of whether she had chest pain, but an initial ECG in the field was interpreted as STEMI so she was transported here, however this was canceled by Cardiology on arrival and she denies chest pain now.  Patient tells me she woke up today "weird" but cannot elaborate.  Makes mostly inappropriate statements, states she wants to leave.   ED course: -Temp 79F, Heart rate 107, respirations normal, pulse ox high 80s, BP 145/72 -Na 133, K 4.0, Cr 0.51, WBC 10.7K, Hgb 10.8 normocytic, stable -Troponin negative -ABG showed pH 7.33, pCO2 91, HCO3 42, pO2 normal on oxymask -Lactic acid 1.2 -CXR shows emphysema, no focal opacity -ECG showed sinus rhythm, some early repolarization in inferior leads -She was given Solu-medrol and magnesium and nebs and TRH were asked to evaluate for confusion     ROS: Review of Systems  Unable to perform ROS: Mental status change          Past Medical History:  Diagnosis Date  . Anxiety and depression   . Asthma   . Chronic back pain   . Chronic neck pain   . COPD (chronic obstructive pulmonary disease) ([place])   . 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 Depression   . GERD (gastroesophageal reflux disease)   . Hypertension   . MS (multiple sclerosis) ([place])     . MS (multiple sclerosis) ([place])    [facility] xrays showed signs of ms  . On home O2    2L N/C  . Shortness of breath dyspnea   . Sleep apnea     Past Surgical History:  Procedure Laterality Date  . ABDOMINAL HYSTERECTOMY    . BIOPSY  04/24/2015   Procedure: BIOPSY (Gastric);  Surgeon: [name], MD;  Location: [facility];  Service: Endoscopy;;  . BLADDER SURGERY    . BREAST SURGERY    . CARPAL TUNNEL RELEASE    . CHOLECYSTECTOMY    . ESOPHAGOGASTRODUODENOSCOPY  2009   Dr. Laural Golden: small sliding hiatal hernia with mild reflux esophagitis, empiric dilation with 54/56 F, nonerosive antral gastritis   . ESOPHAGOGASTRODUODENOSCOPY (EGD) WITH PROPOFOL N/A 04/24/2015   RMR: normal esophagus status post Maloney dilation. 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   [name] HEMORRHOID SURGERY    . MALONEY DILATION N/A 04/24/2015   Procedure: MALONEY ESOPHAGEAL DILATION (54FR);  Surgeon: [name], MD;  Location: [facility];  Service: Endoscopy;  Laterality: N/A;  . NECK SURGERY    . SHOULDER SURGERY Right     Social History: Patient lives alone.  The patient walks unassisted.  She is a smoker.    No Known Allergies  Family history: family history includes Diabetes in her brother, father, and mother.  Prior to Admission medications  Medication Sig Start Date End Date Taking? Authorizing Provider  albuterol (PROVENTIL HFA;VENTOLIN HFA) 108 (90 Base) MCG/ACT inhaler Inhale 2 puffs into the lungs every 4 (four) hours as needed for wheezing or shortness of breath.    [provider]  ALPRAZolam Duanne Moron) 1 MG tablet Take 1 mg by mouth 4 (four) times daily as needed for anxiety.     [provider]  clopidogrel (PLAVIX) 75 MG tablet Take 75 mg by mouth daily.    [provider]  cyclobenzaprine (FLEXERIL) 10 MG tablet Take 10 mg by mouth 3 (three) times daily as needed for muscle spasms.     [provider]  donepezil (ARICEPT) 10 MG  tablet Take 10 mg by mouth at bedtime.     [provider]  ENSURE (ENSURE) Take 237 mLs by mouth 2 (two) times daily between meals.     [provider]  esomeprazole (NEXIUM) 40 MG capsule Take 40 mg by mouth daily.    [provider]  fluticasone-salmeterol (ADVAIR HFA) 230-21 MCG/ACT inhaler Inhale 2 puffs into the lungs 2 (two) times daily.    [provider]  guaiFENesin-codeine (ROBITUSSIN AC) 100-10 MG/5ML syrup Take 5-10 mLs by mouth every 6 (six) hours as needed for cough or congestion.    [provider]  ipratropium-albuterol (DUONEB) 0.5-2.5 (3) MG/3ML SOLN Take 3 mLs by nebulization every 6 (six) hours as needed (for wheezing/shortness of breath).     [provider]  loratadine (CLARITIN) 10 MG tablet Take 10 mg by mouth daily.     [provider]  Oxycodone HCl 20 MG TABS Take 20 mg by mouth every 6 (six) hours as needed (for pain).    [provider]  PARoxetine (PAXIL) 40 MG tablet Take 40 mg by mouth daily.     [provider]  potassium chloride (K-DUR) 10 MEQ tablet Take 10 mEq by mouth daily.     [provider]  predniSONE (STERAPRED UNI-PAK 21 TAB) 10 MG (21) TBPK tablet Take 6 tabs by mouth daily  for 2 days, then 5 tabs for 2 days, then 4 tabs for 2 days, then 3 tabs for 2 days, 2 tabs for 2 days, then 1 tab by mouth daily for 2 days 11/30/16   [name], MD  tiotropium (SPIRIVA) 18 MCG inhalation capsule Place 18 mcg into inhaler and inhale daily.    [provider]       Physical Exam: BP (!) 144/77   Pulse 83   Temp 99 F (37.2 C) (Oral)   Resp 16   SpO2 94%  General appearance: Thin chronically ill-appearing adult female, awake but confused and inappropriate, appears to be in some respiratory.   Eyes: Anicteric, conjunctiva pink, lids and lashes normal. PERRL.    ENT: No nasal deformity, discharge, epistaxis.  Hearing normal. OP dry without lesions.   Neck:  No neck masses.  Trachea midline.  No thyromegaly/tenderness. Lymph: No cervical or supraclavicular lymphadenopathy. Skin: Warm and dry.  No jaundice.  No suspicious rashes or lesions. Cardiac: Tachycardic, regular, nl S1-S2, no murmurs appreciated.  Capillary refill is brisk.  JVP normal.  No LE edema.  Radial pulses 2+ and symmetric. Respiratory: Increased work of breathing.  Resapirations shallow and tight.  Few tight wheezes appreciated.  No rales or focal diminished areas. Abdomen: Abdomen soft.  No TTP. No ascites, distension, hepatosplenomegaly.   MSK: No deformities or effusions.  No cyanosis or clubbing.  Diffuse loss of  muscle mass Neuro: Cranial nerves unable to test, does nto follow commands.  Face symmetric.  EOM appear normal.Speech is fluent but utterances are often inappropraite or non sequitur.  Muscle strength 5/5 in upper extremities.  Seems symmetrically mildly diminished in both legs.    Psych: Awake but responds inappropriately.  States she is in a [facility], can't tell how she got here or why.  Can't tell what year it is.       Labs on Admission:  I have personally reviewed following labs and imaging studies: CBC:  Recent Labs Lab 01/23/17 2133 01/23/17 2152  WBC 10.7*  --   NEUTROABS 9.4*  --   HGB 10.8* 12.9  HCT 38.8 38.0  MCV 93.3  --   PLT 174  --    Basic Metabolic Panel:  Recent Labs Lab 01/23/17 2129 01/23/17 2152  NA 133* 133*  K 4.0 3.9  CL 86* 83*  CO2 42*  --   GLUCOSE 92 90  BUN <5* 6  CREATININE 0.51 0.70  CALCIUM 10.0  --    GFR: CrCl cannot be calculated (Unknown ideal weight.).  Liver Function Tests:  Recent Labs Lab 01/23/17 2129  AST 19  ALT 15  ALKPHOS 81  BILITOT 0.5  PROT 6.1*  ALBUMIN 2.8*   No results for input(s): LIPASE, AMYLASE in the last 168 hours. No results for input(s): AMMONIA in the last 168 hours. Coagulation Profile: No results for input(s): INR, PROTIME in the last 168 hours. Cardiac  Enzymes: No results for input(s): CKTOTAL, CKMB, CKMBINDEX, TROPONINI in the last 168 hours. BNP (last 3 results) No results for input(s): PROBNP in the last 8760 hours. HbA1C: No results for input(s): HGBA1C in the last 72 hours. CBG: No results for input(s): GLUCAP in the last 168 hours. Lipid Profile: No results for input(s): CHOL, HDL, LDLCALC, TRIG, CHOLHDL, LDLDIRECT in the last 72 hours. Thyroid Function Tests: No results for input(s): TSH, T4TOTAL, FREET4, T3FREE, THYROIDAB in the last 72 hours. Anemia Panel: No results for input(s): VITAMINB12, FOLATE, FERRITIN, TIBC, IRON, RETICCTPCT in the last 72 hours. Sepsis Labs: Lactic acid 1.2 Invalid input(s): PROCALCITONIN, LACTICIDVEN No results found for this or any previous visit (from the past 240 hour(s)).       Radiological Exams on Admission: Personally reviewed CXR shows emphysema, no focal opacity: Dg Chest Port 1 View  Result Date: 01/23/2017 CLINICAL DATA:  Acute shortness of breath today. EXAM: PORTABLE CHEST 1 VIEW COMPARISON:  12/11/2016 and prior chest radiographs FINDINGS: The cardiomediastinal silhouette is unchanged. Right basilar atelectasis/scarring again noted. There is no evidence of focal airspace disease, pulmonary edema, suspicious pulmonary nodule/mass, pleural effusion, or pneumothorax. No acute bony abnormalities are identified. Cervical spine surgical hardware again noted. IMPRESSION: No evidence of acute cardiopulmonary disease. Electronically Signed   By: Margarette Canada M.D.   On: 01/23/2017 21:50    EKG: Independently reviewed. Rate 96, QTc 438, concave up ST segments and notched J in inferior leads, similar to previous.         Assessment/Plan Principal Problem:   COPD with acute exacerbation (HCC) Active Problems:   OSA (obstructive sleep apnea)   Tobacco use disorder   Hyponatremia   HTN (hypertension)   Acute on chronic respiratory failure with hypoxia and hypercapnia (HCC)   Altered  mental status   Normocytic anemia  1. Altered mental status: Suspect this is multifactorial from acute on chronic hypercapnia as well as alprazolam use (from [employer], she  takes severe oxycodone 20 mg tablets and 4 alprazolam 1 mg tablets daily). -BiPAP -ABG in AM -Check ammonia -Low suspicion for infection at presents, but culture for fever -I find no focal deficits on neuro exam, but If no improvement with resolving respiratory acidosis, consider MRI and Neuro consult for MS -Taper anxiolytics and opiates to lower dose, as PRN, monitor for withdrawal, avoid oversedation   2. COPD exacerbation:  -Solu-medrol IV -Nebulized bronchodilators scheduled and PRN -Continue home Dulera and tiotropium  3. Hyponatremia:  Mild  4. Abnormal ECG:  Code STEMI called off by cardiology. -Repeat troponin in the morning  5. Normocytic anemia:  Stable, no evidence of bleeding  6. Coronary artery disease:  -Continue Plavix  7. Other medications:  -Continue Paxil -Hold Aricept until mental status clearer  8. OSA: Chart history of OSA although I don't see where this was diagnosed and can't tell if she wears a CPAP or BiPAP at night.   -Combination of OSA, COPD and high doses of BZD/opiates is not safe. -Taper Xanax and oxycodone     DVT prophylaxis: Lovenox  Code Status: FULL  Family Communication: None present  Disposition Plan: Anticipate BiPAP, repeat ABG in AM, monitor mental status, taper anxiolytics. Consults called: None Admission status: INPATIENT        Medical decision making: Patient seen at 11:35 PM on 01/23/2017.  The patient was discussed with Dr. Roderic Palau.  What exists of the patient's chart was reviewed in depth and summarized above.  Clinical condition: requiring BiPAP, mentation still confused and agitated, requires stepdown care and frequent monitoring.        Edwin Dada [facility] Pager [phone]       At the time of  admission, it appears that the appropriate admission status for this patient is INPATIENT. This is judged to be reasonable and necessary in order to provide the required intensity of service to ensure the patient's safety given the presenting symptoms, physical exam findings, and initial radiographic and laboratory data in the context of their chronic comorbidities.  Together, these circumstances are felt to place her at high risk for further clinical deterioration threatening life, limb, or organ.   Patient requires inpatient status due to high intensity of service, high risk for further deterioration and high frequency of surveillance required because of this severe exacerbation of their chronic organ failure.  Factors to support inpatient status include presentation with respiratory failure with hypoxia, hypercapnia to PCO2 91, altered mental status, decreased breath sounds, increased respiratory effort, and confusion.  I certify that at the point of admission it is my clinical judgment that the patient will require inpatient hospital care spanning beyond 2 midnights from the point of admission and that early discharge would result in unnecessary risk of decompensation and readmission or threat to life, limb or bodily function.

## 2017-01-25 ENCOUNTER — Inpatient Hospital Stay (HOSPITAL_COMMUNITY): Payer: Medicare HMO

## 2017-01-25 DIAGNOSIS — R9431 Abnormal electrocardiogram [ECG] [EKG]: Secondary | ICD-10-CM

## 2017-01-25 LAB — ECHOCARDIOGRAM COMPLETE
AVLVOTPG: 7 mmHg
Area-P 1/2: 4.78 cm2
CHL CUP DOP CALC LVOT VTI: 26.5 cm
CHL CUP MV DEC (S): 156
E/e' ratio: 0.96
EWDT: 156 ms
FS: 26 % — AB (ref 28–44)
Height: 65 in
IVS/LV PW RATIO, ED: 1.17
LA ID, A-P, ES: 38 mm
LA diam index: 2.53 cm/m2
LAVOLA4C: 36.6 mL
LEFT ATRIUM END SYS DIAM: 38 mm
LV E/e' medial: 0.96
LV E/e'average: 0.96
LV PW d: 9.51 mm — AB (ref 0.6–1.1)
LV TDI E'MEDIAL: 7.51
LVOT SV: 83 mL
LVOT area: 3.14 cm2
LVOT peak vel: 134 cm/s
LVOTD: 20 mm
Lateral S' vel: 15.2 cm/s
MV pk E vel: 7.18 m/s
MVPKAVEL: 187 m/s
P 1/2 time: 46 ms
TAPSE: 18.4 mm
Weight: 1680 oz

## 2017-01-25 LAB — COMPREHENSIVE METABOLIC PANEL
ALBUMIN: 2.9 g/dL — AB (ref 3.5–5.0)
ALK PHOS: 70 U/L (ref 38–126)
ALT: 11 U/L — ABNORMAL LOW (ref 14–54)
ANION GAP: 6 (ref 5–15)
AST: 17 U/L (ref 15–41)
BUN: 9 mg/dL (ref 6–20)
CALCIUM: 9.9 mg/dL (ref 8.9–10.3)
CHLORIDE: 89 mmol/L — AB (ref 101–111)
CO2: 40 mmol/L — AB (ref 22–32)
Creatinine, Ser: 0.48 mg/dL (ref 0.44–1.00)
GFR calc Af Amer: >60 mL/min (ref >60)
GFR calc non Af Amer: >60 mL/min (ref >60)
GLUCOSE: 117 mg/dL — AB (ref 65–99)
POTASSIUM: 5 mmol/L (ref 3.5–5.1)
SODIUM: 135 mmol/L (ref 135–145)
Total Bilirubin: 0.5 mg/dL (ref 0.3–1.2)
Total Protein: 6 g/dL — ABNORMAL LOW (ref 6.5–8.1)

## 2017-01-25 LAB — TROPONIN I

## 2017-01-25 LAB — CBC WITH DIFFERENTIAL/PLATELET
BASOS PCT: 0 %
Basophils Absolute: 0 10*3/uL (ref 0.0–0.1)
EOS ABS: 0 10*3/uL (ref 0.0–0.7)
Eosinophils Relative: 0 %
HCT: 37.3 % (ref 36.0–46.0)
HEMOGLOBIN: 10.5 g/dL — AB (ref 12.0–15.0)
Lymphocytes Relative: 7 %
Lymphs Abs: 0.5 10*3/uL — ABNORMAL LOW (ref 0.7–4.0)
MCH: 25 pg — ABNORMAL LOW (ref 26.0–34.0)
MCHC: 28.2 g/dL — AB (ref 30.0–36.0)
MCV: 88.8 fL (ref 78.0–100.0)
MONOS PCT: 3 %
Monocytes Absolute: 0.2 10*3/uL (ref 0.1–1.0)
NEUTROS PCT: 90 %
Neutro Abs: 7.2 10*3/uL (ref 1.7–7.7)
Platelets: 168 10*3/uL (ref 150–400)
RBC: 4.2 MIL/uL (ref 3.87–5.11)
RDW: 14.8 % (ref 11.5–15.5)
WBC: 7.9 10*3/uL (ref 4.0–10.5)

## 2017-01-25 LAB — MAGNESIUM: Magnesium: 1.7 mg/dL (ref 1.7–2.4)

## 2017-01-25 MED ORDER — METHYLPREDNISOLONE SODIUM SUCC 40 MG IJ SOLR
40.0000 mg | Freq: Two times a day (BID) | INTRAMUSCULAR | Status: AC
  Administered 2017-01-25 – 2017-01-26 (×3): 40 mg via INTRAVENOUS
  Filled 2017-01-25 (×3): qty 1

## 2017-01-25 NOTE — Progress Notes (Signed)
  Echocardiogram 2D Echocardiogram has been performed.  Emmanuel Ercole T Draven Natter 01/25/2017, 1:06 PM

## 2017-01-25 NOTE — Evaluation (Signed)
Physical Therapy Evaluation Patient Details Name: Olivia Randall MRN: [id number] DOB: 1951-07-15 Today's Date: 01/25/2017   History of Present Illness  Pt adm with acute toxic metabolic encephalopathy and acute on chronic respiratory failure. Pt on bipap at times. PMH - dementia, multiple sclerosis, COPD on home O2, CAD, and HTN   Clinical Impression  Pt admitted with above diagnosis and presents to PT with functional limitations due to deficits listed below (See PT problem list). Pt needs skilled PT to maximize independence and safety to allow discharge to SNF. Pt currently needs assist for all mobility and with poor activity tolerance. Family unable to provide needed assist.     Follow Up Recommendations SNF    Equipment Recommendations  None recommended by PT    Recommendations for Other Services       Precautions / Restrictions Precautions Precautions: Fall Restrictions Weight Bearing Restrictions: No      Mobility  Bed Mobility Overal bed mobility: Needs Assistance Bed Mobility: Sit to Supine       Sit to supine: Supervision   General bed mobility comments: Incr time and line management  Transfers Overall transfer level: Needs assistance Equipment used: None;4-wheeled walker Transfers: Sit to/from Stand Sit to Stand: Min assist         General transfer comment: Assist for balance  Ambulation/Gait Ambulation/Gait assistance: Min assist Ambulation Distance (Feet): 30 Feet Assistive device: 4-wheeled walker Gait Pattern/deviations: Step-through pattern;Decreased step length - right;Decreased step length - left;Drifts right/left;Trunk flexed;Narrow base of support Gait velocity: decr Gait velocity interpretation: Below normal speed for age/gender General Gait Details: Assist for balance and support. Verbal cues to stay closer to walker. Pt self limiting with distance. Amb on 4L O2 with SpO2 >94%  Stairs            Wheelchair Mobility    Modified  [place] (Stroke Patients Only)       Balance Overall balance assessment: Needs assistance Sitting-balance support: No upper extremity supported;Feet supported Sitting balance-Leahy Scale: Good     Standing balance support: Single extremity supported Standing balance-Leahy Scale: Poor Standing balance comment: UE support and min A for static standing                             Pertinent Vitals/Pain Pain Assessment: Faces Faces Pain Scale: No hurt    Home Living Family/patient expects to be discharged to:: Skilled nursing facility Living Arrangements: Other relatives (grandchildren)             Home Equipment: Gilford Rile - 2 wheels;Shower seat - built in;Grab bars - toilet;Bedside commode      Prior Function Level of Independence: Needs assistance   Gait / Transfers Assistance Needed: amb modified independent. Unsure if using walker.           Hand Dominance        Extremity/Trunk Assessment   Upper Extremity Assessment Upper Extremity Assessment: Defer to OT evaluation    Lower Extremity Assessment Lower Extremity Assessment: Generalized weakness       Communication   Communication: No difficulties  Cognition Arousal/Alertness: Awake/alert Behavior During Therapy: Restless Overall Cognitive Status: Impaired/Different from baseline Area of Impairment: Attention;Memory;Safety/judgement;Following commands;Problem solving                   Current Attention Level: Sustained Memory: Decreased short-term memory;Decreased recall of precautions Following Commands: Follows one step commands consistently Safety/Judgement: Decreased awareness of safety;Decreased awareness of deficits  Problem Solving: Requires verbal cues;Requires tactile cues        General Comments      Exercises     Assessment/Plan    PT Assessment Patient needs continued PT services  PT Problem List Decreased strength;Decreased activity tolerance;Decreased  balance;Decreased mobility;Decreased cognition;Decreased knowledge of use of DME;Decreased safety awareness       PT Treatment Interventions DME instruction;Gait training;Functional mobility training;Therapeutic activities;Therapeutic exercise;Balance training;Patient/family education    PT Goals (Current goals can be found in the Care Plan section)  Acute Rehab PT Goals Patient Stated Goal: Not stated PT Goal Formulation: With patient Time For Goal Achievement: 02/01/17 Potential to Achieve Goals: Good    Frequency Min 2X/week   Barriers to discharge Decreased caregiver support Needs 24 hour assist    Co-evaluation               AM-PAC PT "6 Clicks" Daily Activity  Outcome Measure Difficulty turning over in bed (including adjusting bedclothes, sheets and blankets)?: A Little Difficulty moving from lying on back to sitting on the side of the bed? : A Little Difficulty sitting down on and standing up from a chair with arms (e.g., wheelchair, bedside commode, etc,.)?: Total Help needed moving to and from a bed to chair (including a wheelchair)?: A Little Help needed walking in hospital room?: A Little Help needed climbing 3-5 steps with a railing? : Total 6 Click Score: 14    End of Session Equipment Utilized During Treatment: Gait belt;Oxygen Activity Tolerance: Patient limited by fatigue Patient left: in bed;with call bell/phone within reach;with bed alarm set Nurse Communication: Mobility status PT Visit Diagnosis: Unsteadiness on feet (R26.81);Muscle weakness (generalized) (M62.81);Difficulty in walking, not elsewhere classified (R26.2)    Time: 5003-7048 PT Time Calculation (min) (ACUTE ONLY): 21 min   Charges:   PT Evaluation $PT Eval Moderate Complexity: 1 Procedure     PT G CodesMarland Kitchen        [facility] PT [place] 01/25/2017, 5:30 PM

## 2017-01-25 NOTE — Progress Notes (Signed)
RT entered pt's room, and she was on Bipap trying to eat breakfast.  RT took pt off bipap and placed on [place] 3 L.[name]

## 2017-01-25 NOTE — Progress Notes (Signed)
Patient refusing bipap. Resting at this time in no respiratory distress. BIPAP is on standby at bedside. RT will continue to monitor.

## 2017-01-25 NOTE — Progress Notes (Signed)
RT and charge placed patient back on BIPAP per patients request and c/o shortness of breath on 3 liters 

## 2017-01-25 NOTE — Progress Notes (Signed)
[facility] Progress Note  Patient: Olivia Randall JSE:[id number]   PCP: Octavio Graves, DO DOB: 1951/05/08   DOA: 01/23/2017   DOS: 01/25/2017   Date of Service: the patient was seen and examined on 01/25/2017  Subjective: Patient required BiPAP last night for respiratory distress. This morning is awake and oriented and in no distress. No chest pain as well. Nausea and vomiting.  Brief hospital course: Pt. with PMH of COPD on home O2 with chronic respiratory failure, COPD, MS, dementia, HTN, chronic pain syndrome, chronic anxiety; admitted on 01/23/2017, presented with complaint of unresponsive episode, was found to have acute on chronic hypercarbic respiratory failure likely polypharmacy versus COPD flareup or combination. Currently further plan is continue current treatment.  Assessment and Plan: 1. Acute toxic metabolic encephalopathy. Acute on chronic respiratory failure with hypercapnia. COPD exacerbation. Polypharmacy. Patient presented with complaints of unresponsive episode as well as confusion, found to have hypercarbic respiratory failure acute on chronic. Started on BiPAP. Shows some improvement in her mentation but still not at her baseline. We will get MRI of the brain to rule out any acute abnormality related to her MS. Minimize all psychotropic medication at present. Changing BiPAP to as needed as the patient is tolerating 3 L of oxygen, stop scheduled BiPAP. ABG shows persistent hypercarbia but chronic compensated respiratory acidosis. Continue DuoNeb's, salmeterol, and Unasyn. Chest x-ray unremarkable, CT head unremarkable.  2. Abnormal EKG. coronary disease Patient was found to have an abnormal EKG by EMS, cardiology evaluated the EKG on phone and recommended no indication for code STEMI. Initial troponin negative. We will continue to follow. Echocardiogram shows no wall motion normality and was preserved EF. Continue Plavix.  3.  Depression. Namenda. Continue Paxil, holding Aricept. Discontinuing benzodiazepine at present.  4. Chronic pain syndrome. Reportedly the patient is taking 20 mg of OxyIR every 4 hours as needed at home. Currently on oxy 5 mg every 4 hour as needed, I would change to Norco given patient's mentation and lethargy.  Diet: soft diet DVT Prophylaxis: subcutaneous Heparin  Advance goals of care discussion: full code  Family Communication: no family was present at bedside, at the time of interview.   Disposition:  Discharge to SNF.  Consultants: none Procedures: none  Antibiotics: Anti-infectives    Start     Dose/Rate Route Frequency Ordered Stop   01/24/17 1700  ampicillin-sulbactam (UNASYN) 1.5 g in sodium chloride 0.9 % 50 mL IVPB     1.5 g 100 mL/hr over 30 Minutes Intravenous Every 6 hours 01/24/17 1600         Objective: Physical Exam: Vitals:   01/25/17 0826 01/25/17 1030 01/25/17 1332 01/25/17 1434  BP:  (!) 152/83 (!) 151/79 (!) 151/79  Pulse: 71   82  Resp: (!) 26   (!) 23  Temp:  97.3 F (36.3 C) 98.7 F (37.1 C)   TempSrc:  Oral Oral   SpO2: 98%   94%  Weight:      Height:        Intake/Output Summary (Last 24 hours) at 01/25/17 1852 Last data filed at 01/25/17 1230  Gross per 24 hour  Intake             1170 ml  Output              325 ml  Net              845 ml   Filed Weights   01/24/17 0158 01/25/17 0444  Weight: 48  kg (105 lb 12.8 oz) 47.6 kg (105 lb)   General: Alert, Awake and Oriented to Time, Place and Person. Appear in moderate distress, affect appropriate Eyes: PERRL, Conjunctiva normal ENT: Oral Mucosa clear moist. Neck: difficult to assess JVD, no Abnormal Mass Or lumps Cardiovascular: S1 and S2 Present, no Murmur, Respiratory: Bilateral Air entry equal and Decreased, no use of accessory muscle,no Crackles, bilateral wheezes Abdomen: Bowel Sound present, Soft and no tenderness Skin: no redness, no Rash, no induration Extremities:  no Pedal edema, no calf tenderness Neurologic: Grossly no focal neuro deficit. Bilaterally Equal motor strength  Data Reviewed: CBC:  Recent Labs Lab 01/23/17 2133 01/23/17 2152 01/24/17 0213 01/25/17 0754  WBC 10.7*  --  8.5 7.9  NEUTROABS 9.4*  --   --  7.2  HGB 10.8* 12.9 10.9* 10.5*  HCT 38.8 38.0 38.3 37.3  MCV 93.3  --  92.3 88.8  PLT 174  --  172 675   Basic Metabolic Panel:  Recent Labs Lab 01/23/17 2129 01/23/17 2152 01/24/17 0213 01/25/17 0754  NA 133* 133* 136 135  K 4.0 3.9 4.5 5.0  CL 86* 83* 87* 89*  CO2 42*  --  45* 40*  GLUCOSE 92 90 132* 117*  BUN <5* 6 <5* 9  CREATININE 0.51 0.70 0.58 0.48  CALCIUM 10.0  --  10.2 9.9  MG  --   --   --  1.7    Liver Function Tests:  Recent Labs Lab 01/23/17 2129 01/24/17 0213 01/25/17 0754  AST '19 19 17  '$ ALT 15 14 11*  ALKPHOS 81 81 70  BILITOT 0.5 0.5 0.5  PROT 6.1* 6.2* 6.0*  ALBUMIN 2.8* 3.0* 2.9*   No results for input(s): LIPASE, AMYLASE in the last 168 hours.  Recent Labs Lab 01/24/17 0213  AMMONIA 10   Coagulation Profile: No results for input(s): INR, PROTIME in the last 168 hours. Cardiac Enzymes:  Recent Labs Lab 01/24/17 0213 01/24/17 1619 01/24/17 2154 01/25/17 0356  TROPONINI <0.03 <0.03 <0.03 <0.03   BNP (last 3 results) No results for input(s): PROBNP in the last 8760 hours. CBG: No results for input(s): GLUCAP in the last 168 hours. Studies: Mr Brain Wo Contrast  Result Date: 01/24/2017 CLINICAL DATA:  Confusion, assess acute encephalopathy. History of multiple sclerosis, hypertension and dementia. EXAM: MRI HEAD WITHOUT CONTRAST TECHNIQUE: Multiplanar, multiecho pulse sequences of the brain and surrounding structures were obtained without intravenous contrast. COMPARISON:  CT HEAD Jan 24, 2017 at 0132 hours FINDINGS: All sequences are moderately or severely motion degraded. Per technologist note, motion increased after medication. BRAIN: No reduced diffusion to suggest  acute ischemia. No susceptibility artifact to suggest hemorrhage. The ventricles and sulci are normal for patient's age. Patchy supratentorial and pontine white matter T2 hyperintensities. No suspicious parenchymal signal, masses or mass effect. No abnormal extra-axial fluid collections. VASCULAR: Normal major intracranial vascular flow voids present at skull base. 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 SKULL AND UPPER CERVICAL SPINE: No abnormal sellar expansion. No suspicious calvarial bone marrow signal. Craniocervical junction maintained. SINUSES/ORBITS: The mastoid air-cells and included paranasal sinuses are well-aerated. The included ocular globes and orbital contents are non-suspicious. OTHER: Patient is edentulous. IMPRESSION: No acute intracranial process on this motion degraded examination. Moderate chronic small vessel ischemic disease. 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 Recommend nonemergent CTA or MRA angiography when patient is able to remain still. Electronically Signed   By: Elon Alas M.D.   On: 01/24/2017  20:34    Scheduled Meds: . albuterol  2.5 mg Nebulization BID  . chlorhexidine  15 mL Mouth Rinse BID  . clopidogrel  75 mg Oral Daily  . enoxaparin (LOVENOX) injection  40 mg Subcutaneous Q24H  . mouth rinse  15 mL Mouth Rinse q12n4p  . methylPREDNISolone (SOLU-MEDROL) injection  60 mg Intravenous Q6H  . mometasone-formoterol  2 puff Inhalation BID  . PARoxetine  40 mg Oral Daily  . sodium chloride flush  3 mL Intravenous Q12H  . tiotropium  18 mcg Inhalation Daily   Continuous Infusions: . ampicillin-sulbactam (UNASYN) IV Stopped (01/25/17 1830)   PRN Meds: acetaminophen **OR** acetaminophen, albuterol, HYDROcodone-acetaminophen, ondansetron **OR** ondansetron (ZOFRAN) IV  Time spent: 30 minutes  Author: Berle Mull, MD [facility] Pager: [phone] 01/25/2017 6:52 PM  If 7PM-7AM, please contact night-coverage at [email or web address],  password [facility]

## 2017-01-25 NOTE — Progress Notes (Signed)
RT placed patient back on BIPAP. Patient was having some SOB and increased WOB. Patient tolerating Bipap at this time.

## 2017-01-26 ENCOUNTER — Inpatient Hospital Stay (HOSPITAL_COMMUNITY): Payer: Medicare HMO

## 2017-01-26 ENCOUNTER — Encounter (HOSPITAL_COMMUNITY): Payer: Self-pay

## 2017-01-26 DIAGNOSIS — G9340 Encephalopathy, unspecified: Secondary | ICD-10-CM

## 2017-01-26 LAB — BASIC METABOLIC PANEL
ANION GAP: 3 — AB (ref 5–15)
BUN: 9 mg/dL (ref 6–20)
CHLORIDE: 89 mmol/L — AB (ref 101–111)
CO2: 42 mmol/L — ABNORMAL HIGH (ref 22–32)
Calcium: 9.9 mg/dL (ref 8.9–10.3)
Creatinine, Ser: 0.46 mg/dL (ref 0.44–1.00)
GFR calc Af Amer: >60 mL/min (ref >60)
GFR calc non Af Amer: >60 mL/min (ref >60)
GLUCOSE: 105 mg/dL — AB (ref 65–99)
POTASSIUM: 4.2 mmol/L (ref 3.5–5.1)
SODIUM: 134 mmol/L — AB (ref 135–145)

## 2017-01-26 LAB — CBC
HCT: 33.6 % — ABNORMAL LOW (ref 36.0–46.0)
HEMOGLOBIN: 10 g/dL — AB (ref 12.0–15.0)
MCH: 25.8 pg — AB (ref 26.0–34.0)
MCHC: 29.8 g/dL — ABNORMAL LOW (ref 30.0–36.0)
MCV: 86.8 fL (ref 78.0–100.0)
Platelets: 158 10*3/uL (ref 150–400)
RBC: 3.87 MIL/uL (ref 3.87–5.11)
RDW: 15.3 % (ref 11.5–15.5)
WBC: 6.9 10*3/uL (ref 4.0–10.5)

## 2017-01-26 LAB — PROCALCITONIN

## 2017-01-26 MED ORDER — PREDNISONE 20 MG PO TABS
40.0000 mg | ORAL_TABLET | Freq: Every day | ORAL | Status: DC
  Administered 2017-01-27: 40 mg via ORAL
  Filled 2017-01-26: qty 2

## 2017-01-26 MED ORDER — DONEPEZIL HCL 10 MG PO TABS
10.0000 mg | ORAL_TABLET | Freq: Every day | ORAL | Status: DC
  Administered 2017-01-26: 10 mg via ORAL
  Filled 2017-01-26: qty 1

## 2017-01-26 NOTE — Progress Notes (Signed)
Patient does not wish to wear CPAP tonight. RT will continue to monitor.  

## 2017-01-26 NOTE — Clinical Social Work Note (Signed)
Clinical Social Work Assessment  Patient Details  Name: Olivia Randall MRN: [id number] Date of Birth: 02-Sep-1951  Date of referral:  01/26/17               Reason for consult:  Facility Placement                Permission sought to share information with:  Family Supports Permission granted to share information::     Name::        Agency::     Relationship::     Contact Information:     Housing/Transportation Living arrangements for the past 2 months:  Single Family Home Source of Information:  Patient Patient Interpreter Needed:  None Criminal Activity/Legal Involvement Pertinent to Current Situation/Hospitalization:  No - Comment as needed Significant Relationships:  Siblings, Dependent Children Lives with:   (Grandaughters) Do you feel safe going back to the place where you live?    Need for family participation in patient care:     Care giving concerns:  No family or friends present at bedside during initial assessment.   Social Worker assessment / plan:  CSW spoke with pt at bedside. Pt lives with her granddaughters. Pt's husband passed away two months ago. Pt wants to talk with her granddaughter before deciding on SNF. However, pt was agreeable to f/o. CSW to follow up with pt tomorrow to discuss plan.  Employment status:  Retired Nurse, adult PT Recommendations:  [place] / Referral to community resources:  [place]  Patient/Family's Response to care:  Pt verbalized understanding of CSW role and expressed appreciation for support. Pt denies any concern regarding pt care at this time.   Patient/Family's Understanding of and Emotional Response to Diagnosis, Current Treatment, and Prognosis:  Pt understanding and realistic regarding physical limitations. Pt understands the need for SNF placement at d/c. Pt agreeable to SNF placement at d/c, at this time. Pt's responses emotionally appropriate during  conversation with CSW. Pt denies any concern regarding treatment plan at this time. CSW will continue to provide support and facilitate d/c needs.   Emotional Assessment Appearance:  Appears stated age Attitude/Demeanor/Rapport:   (Patient was appropriate.) Affect (typically observed):  Accepting, Appropriate, Flat Orientation:  Oriented to Self, Oriented to Place Alcohol / Substance use:  Not Applicable Psych involvement (Current and /or in the community):  No (Comment)  Discharge Needs  Concerns to be addressed:  Care Coordination Readmission within the last 30 days:  No Current discharge risk:  Dependent with Mobility Barriers to Discharge:  Continued Medical Work up, [employer], LCSW 01/26/2017, 2:17 PM

## 2017-01-26 NOTE — Progress Notes (Signed)
Tech in room to get vitals and offered Pt a bath. Pt stated no not right now, she would like to see if she will be getting discharged today. If discharged today, she will get in shower once she arrives home. If she does not get discharged today, she will get a wipe off later.

## 2017-01-26 NOTE — Progress Notes (Addendum)
PROGRESS NOTE   Olivia Randall  BOF:[id number]    DOB: 1950/10/30    DOA: 01/23/2017  PCP: Octavio Graves, DO   I have briefly reviewed patients previous medical records in [facility].  Brief Narrative:  66 year old female, reports that she lives with her grandchildren and ambulates with the help of a walker, COPD, chronic respiratory failure on home oxygen 2 L/m (changed March after hospitalization to 3-5L/min), OSA on? CPAP, dementia, multiple sclerosis, CAD, HTN, anxiety & depression, chronic back and neck pain, GERD, unreliable historian, brought by EMS for confusion/unresponsiveness, EKG in the field interpreted as STEMI, however this was canceled by cardiology on arrival and chest pain denied, admitted for acute on chronic hypoxic and hypercapnic respiratory failure suspected due to polypharmacy versus COPD exacerbation or combination of both. Improving   Assessment & Plan:   Principal Problem:   COPD with acute exacerbation ([place]) Active Problems:   OSA (obstructive sleep apnea)   Tobacco use disorder   Hyponatremia   Essential hypertension   Acute on chronic respiratory failure with hypoxia and hypercapnia (HCC)   Altered mental status   Normocytic anemia   1. Toxic metabolic encephalopathy: Suspected due to acute on chronic hypoxic and hypercapnic respiratory failure complicated by polypharmacy use-alprazolam and opioids/oxycodone. Started on BiPAP and then changed to when necessary. Minimized psychotropic medications. MRI brain without acute findings. Low index of suspicion for infectious etiology. As per discussion with daughter, mental status is back to baseline at this time. 2. COPD exacerbation: Treated with bronchodilator nebulizations, oxygen, BiPAP and Solu-Medrol. Improving. Changed to oral prednisone taper in a.m. Tobacco cessation counseled. Improved. 3. Acute on chronic hypoxic and hypercapnic respiratory failure: Precipitated by COPD exacerbation and  polypharmacy. Management as above. Off of scheduled BiPAP. Wean oxygen to home level. 4. CAD/abnormal EKG: Abnormal EKG found by EMS was activated STEMI code but cardiology evaluated EKG and canceled code. No chest pain reported. Troponins 4 negative. Echo without wall motion abnormalities and preserved EF. As per pharmacy update, patient has not been on Plavix for greater than a year and hence will DC. 5. Anxiety & depression: Continue Paxil. Off of benzodiazepines at present >discussed with daughter who will verify with her daughter regarding how much Xanax patient was taking at home. If she has been taking this for a long time, we may have to restart at low dose and taper gradually to avoid withdrawal complications. 6. Dementia: Continue Namenda and resume Aricept. 7. Chronic pain syndrome: Patient reportedly takes OxyIR 20 mg every 4 hours as needed at home. Judicious use and minimize opioids. 8. OSA: Nightly CPAP.  9. Essential hypertension: Controlled. 10. Normocytic anemia, chronic: Stable. 11. 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 Radiology recommends nonemergent CTA or MRA, may pursue as outpatient.   DVT prophylaxis: Lovenox Code Status: Full Family Communication: Discussed with daughter, updated care and answered questions. She feels that her MS is back to her baseline. She will find out from her granddaughter re how much Xanax she uses. Disposition: DC SNF when medically improved and stable, possibly in the next 1-2 days.   Consultants:  None   Procedures:  None  Antimicrobials:  Unasyn    Subjective: Seen this morning. Oriented only to self and partly to place. Denies dyspnea or chest pain.   ROS: As per RN, patient is impulsive at times.  Objective:  Vitals:   01/26/17 0747 01/26/17 0902 01/26/17 1024 01/26/17 1134  BP: (!) 141/88  132/68  140/62  Pulse: 69   80  Resp: (!) 22   (!) 23  Temp: 98.9 F (37.2 C)   99.2 F (37.3 C)  TempSrc:  Oral   Oral  SpO2: 98%  99% 96%  Weight:      Height:        Examination:  General exam: Pleasant middle-aged female, small built, frail, sitting up at edge of bed eating breakfast this morning. Respiratory system: Slightly harsh breath sounds but no wheezing or rhonchi. Occasional crackles in the right base. Respiratory effort normal. Cardiovascular system: S1 & S2 heard, RRR. No JVD, murmurs, rubs, gallops or clicks. No pedal edema. Telemetry: Sinus rhythm. Gastrointestinal system: Abdomen is nondistended, soft and nontender. No organomegaly or masses felt. Normal bowel sounds heard. Central nervous system: Alert and oriented self and partly to place. No focal neurological deficits. Extremities: Symmetric 5 x 5 power. Skin: No rashes, lesions or ulcers Psychiatry: Judgement and insight impaired. Mood & affect appropriate.     Data Reviewed: I have personally reviewed following labs and imaging studies  CBC:  Recent Labs Lab 01/23/17 2133 01/23/17 2152 01/24/17 0213 01/25/17 0754 01/26/17 0520  WBC 10.7*  --  8.5 7.9 6.9  NEUTROABS 9.4*  --   --  7.2  --   HGB 10.8* 12.9 10.9* 10.5* 10.0*  HCT 38.8 38.0 38.3 37.3 33.6*  MCV 93.3  --  92.3 88.8 86.8  PLT 174  --  172 168 503   Basic Metabolic Panel:  Recent Labs Lab 01/23/17 2129 01/23/17 2152 01/24/17 0213 01/25/17 0754 01/26/17 0520  NA 133* 133* 136 135 134*  K 4.0 3.9 4.5 5.0 4.2  CL 86* 83* 87* 89* 89*  CO2 42*  --  45* 40* 42*  GLUCOSE 92 90 132* 117* 105*  BUN <5* 6 <5* 9 9  CREATININE 0.51 0.70 0.58 0.48 0.46  CALCIUM 10.0  --  10.2 9.9 9.9  MG  --   --   --  1.7  --    Liver Function Tests:  Recent Labs Lab 01/23/17 2129 01/24/17 0213 01/25/17 0754  AST '19 19 17  '$ ALT 15 14 11*  ALKPHOS 81 81 70  BILITOT 0.5 0.5 0.5  PROT 6.1* 6.2* 6.0*  ALBUMIN 2.8* 3.0* 2.9*   Cardiac Enzymes:  Recent Labs Lab 01/24/17 0213 01/24/17 1619 01/24/17 2154 01/25/17 0356  TROPONINI <0.03 <0.03 <0.03  <0.03     Recent Results (from the past 240 hour(s))  MRSA PCR Screening     Status: None   Collection Time: 01/24/17  1:55 AM  Result Value Ref Range Status   MRSA by PCR NEGATIVE NEGATIVE Final    Comment:        The GeneXpert MRSA Assay (FDA approved for NASAL specimens only), is one component of a comprehensive MRSA colonization surveillance program. It is not intended to diagnose MRSA infection nor to guide or monitor treatment for MRSA infections.          Radiology Studies: Mr Brain Wo Contrast  Result Date: 01/24/2017 CLINICAL DATA:  Confusion, assess acute encephalopathy. History of multiple sclerosis, hypertension and dementia. EXAM: MRI HEAD WITHOUT CONTRAST TECHNIQUE: Multiplanar, multiecho pulse sequences of the brain and surrounding structures were obtained without intravenous contrast. COMPARISON:  CT HEAD Jan 24, 2017 at 0132 hours FINDINGS: All sequences are moderately or severely motion degraded. Per technologist note, motion increased after medication. BRAIN: No reduced diffusion to suggest acute ischemia. No susceptibility artifact to  suggest hemorrhage. The ventricles and sulci are normal for patient's age. Patchy supratentorial and pontine white matter T2 hyperintensities. No suspicious parenchymal signal, masses or mass effect. No abnormal extra-axial fluid collections. VASCULAR: Normal major intracranial vascular flow voids present at skull base. 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 SKULL AND UPPER CERVICAL SPINE: No abnormal sellar expansion. No suspicious calvarial bone marrow signal. Craniocervical junction maintained. SINUSES/ORBITS: The mastoid air-cells and included paranasal sinuses are well-aerated. The included ocular globes and orbital contents are non-suspicious. OTHER: Patient is edentulous. IMPRESSION: No acute intracranial process on this motion degraded examination. Moderate chronic small vessel ischemic disease. Suspected 7 x 5 mm A1-2  junction aneurysm. Recommend nonemergent CTA or MRA angiography when patient is able to remain still. Electronically Signed   By: Elon Alas M.D.   On: 01/24/2017 20:34        Scheduled Meds: . albuterol  2.5 mg Nebulization BID  . chlorhexidine  15 mL Mouth Rinse BID  . clopidogrel  75 mg Oral Daily  . enoxaparin (LOVENOX) injection  40 mg Subcutaneous Q24H  . mouth rinse  15 mL Mouth Rinse q12n4p  . methylPREDNISolone (SOLU-MEDROL) injection  40 mg Intravenous Q12H  . mometasone-formoterol  2 puff Inhalation BID  . PARoxetine  40 mg Oral Daily  . sodium chloride flush  3 mL Intravenous Q12H  . tiotropium  18 mcg Inhalation Daily   Continuous Infusions: . ampicillin-sulbactam (UNASYN) IV Stopped (01/26/17 0651)     LOS: 3 days     Subhan Hoopes, MD, FACP, FHM. [facility] Pager [phone]  If 7PM-7AM, please contact night-coverage [email or web address] Password [facility] 01/26/2017, 12:25 PM

## 2017-01-27 MED ORDER — PREDNISONE 10 MG PO TABS: ORAL_TABLET | ORAL | 0 refills | Status: DC

## 2017-01-27 MED ORDER — ACETAMINOPHEN 325 MG PO TABS: 650.0000 mg | ORAL_TABLET | Freq: Four times a day (QID) | ORAL | Status: AC | PRN

## 2017-01-27 MED ORDER — ALPRAZOLAM 0.25 MG PO TABS: 0.2500 mg | ORAL_TABLET | Freq: Three times a day (TID) | ORAL | 0 refills | Status: DC | PRN

## 2017-01-27 MED ORDER — ALPRAZOLAM 0.25 MG PO TABS: 0.2500 mg | ORAL_TABLET | Freq: Three times a day (TID) | ORAL | Status: DC | PRN

## 2017-01-27 MED ORDER — TIOTROPIUM BROMIDE MONOHYDRATE 18 MCG IN CAPS: 18.0000 ug | ORAL_CAPSULE | Freq: Every day | RESPIRATORY_TRACT | 0 refills | Status: DC

## 2017-01-27 NOTE — NC FL2 (Signed)
[place] LEVEL OF CARE SCREENING TOOL     IDENTIFICATION  Patient Name: Olivia Randall Birthdate: [birth year] Sex: female Admission Date (Current Location): 01/23/2017  [facility] and [place] Number:  Herbalist and Address:  [facility], [place] [address] [place],  [address]      Provider Number: [id number]  Attending Physician Name and Address:  Modena Jansky, MD  Relative Name and Phone Number:  Beatriz Chancellor, [phone]    Current Level of Care: Hospital Recommended Level of Care: [place] Prior Approval Number:    Date Approved/Denied:   PASRR Number: [id number]  Discharge Plan: SNF    Current Diagnoses: Patient Active Problem List   Diagnosis Date Noted  . Acute on chronic respiratory failure with hypoxia and hypercapnia ([place]) 01/23/2017  . Altered mental status 01/23/2017  . Normocytic anemia 01/23/2017  . Acute respiratory failure with hypercapnia ([place]) 09/05/2016  . Essential hypertension 11/08/2015  . Mucosal abnormality of stomach   . Hyponatremia 03/18/2015  . Sleep apnea   . Anxiety and depression   . Dysphagia, pharyngoesophageal phase 02/18/2015  . OSA (obstructive sleep apnea) 05/20/2013  . Tobacco use disorder 05/20/2013  . Dizzy 12/04/2012  . COPD with acute exacerbation ([place]) 12/03/2012  . Chronic respiratory failure ([place]) 12/03/2012  . Hypokalemia 12/03/2012  . Chronic back pain 12/03/2012  . GERD (gastroesophageal reflux disease) 12/03/2012    Orientation RESPIRATION BLADDER Height & Weight     Self, Place  O2 (Nasal Cannula; 2L) Continent Weight: 101 lb 9.6 oz (46.1 kg) Height:  '5\' 5"'$  (165.1 cm)  BEHAVIORAL SYMPTOMS/MOOD NEUROLOGICAL BOWEL NUTRITION STATUS      Continent  (Please see d/c summary)  AMBULATORY STATUS COMMUNICATION OF NEEDS Skin   Limited Assist Verbally Normal                       Personal Care Assistance Level of Assistance  Bathing,  Feeding, Dressing Bathing Assistance: Limited assistance Feeding assistance: Independent Dressing Assistance: Limited assistance     Functional Limitations Info  Sight, Hearing, Speech Sight Info: Adequate Hearing Info: Adequate Speech Info: Adequate    SPECIAL CARE FACTORS FREQUENCY  PT (By licensed PT), OT (By licensed OT)     PT Frequency: 2x/week OT Frequency: 2x/week            Contractures Contractures Info: Not present    Additional Factors Info  Code Status, Allergies Code Status Info: Full Code Allergies Info: Clopidogrel           Current Medications (01/27/2017):  This is the current hospital active medication list Current Facility-Administered Medications  Medication Dose Route Frequency Provider Last Rate Last Dose  . acetaminophen (TYLENOL) tablet 650 mg  650 mg Oral Q6H PRN Edwin Dada, MD   650 mg at 01/26/17 2139   Or  . acetaminophen (TYLENOL) suppository 650 mg  650 mg Rectal Q6H PRN Danford, Suann Larry, MD      . albuterol (PROVENTIL) (2.5 MG/3ML) 0.083% nebulizer solution 2.5 mg  2.5 mg Nebulization Q2H PRN Edwin Dada, MD   2.5 mg at 01/25/17 0551  . albuterol (PROVENTIL) (2.5 MG/3ML) 0.083% nebulizer solution 2.5 mg  2.5 mg Nebulization BID Lavina Hamman, MD   2.5 mg at 01/27/17 4481  . ALPRAZolam (XANAX) tablet 0.25 mg  0.25 mg Oral TID PRN Modena Jansky, MD      . chlorhexidine (PERIDEX) 0.12 % solution  15 mL  15 mL Mouth Rinse BID Edwin Dada, MD   15 mL at 01/27/17 0954  . donepezil (ARICEPT) tablet 10 mg  10 mg Oral QHS Modena Jansky, MD   10 mg at 01/26/17 2135  . enoxaparin (LOVENOX) injection 40 mg  40 mg Subcutaneous Q24H Edwin Dada, MD   40 mg at 01/27/17 0955  . HYDROcodone-acetaminophen (NORCO/VICODIN) 5-325 MG per tablet 1 tablet  1 tablet Oral Q6H PRN Lavina Hamman, MD      . MEDLINE mouth rinse  15 mL Mouth Rinse q12n4p Edwin Dada, MD   15 mL at 01/27/17 1237  .  mometasone-formoterol (DULERA) 200-5 MCG/ACT inhaler 2 puff  2 puff Inhalation BID Edwin Dada, MD   2 puff at 01/27/17 0940  . ondansetron (ZOFRAN) tablet 4 mg  4 mg Oral Q6H PRN Danford, Suann Larry, MD       Or  . ondansetron (ZOFRAN) injection 4 mg  4 mg Intravenous Q6H PRN Danford, Suann Larry, MD      . PARoxetine (PAXIL) tablet 40 mg  40 mg Oral Daily Danford, Suann Larry, MD   40 mg at 01/27/17 0955  . predniSONE (DELTASONE) tablet 40 mg  40 mg Oral Q breakfast Modena Jansky, MD   40 mg at 01/27/17 0954  . sodium chloride flush (NS) 0.9 % injection 3 mL  3 mL Intravenous Q12H Danford, Suann Larry, MD   3 mL at 01/27/17 0955  . tiotropium (SPIRIVA) inhalation capsule 18 mcg  18 mcg Inhalation Daily Edwin Dada, MD   18 mcg at 01/27/17 8421     Discharge Medications: Please see discharge summary for a list of discharge medications.  Relevant Imaging Results:  Relevant Lab Results:   Additional Information SSN: [id number]  Wende Neighbors, LCSW

## 2017-01-27 NOTE — Evaluation (Signed)
Occupational Therapy Evaluation Patient Details Name: Olivia Randall MRN: [id number] DOB: Dec 26, 1950 Today's Date: 01/27/2017    History of Present Illness Pt adm with acute toxic metabolic encephalopathy and acute on chronic respiratory failure. Pt on bipap at times. PMH - dementia, multiple sclerosis, COPD on home O2, CAD, and HTN    Clinical Impression   Pt was independent in self care, light meal prep and light housekeeping at baseline. She demonstrates poor memory, decreased activity tolerance and impaired standing balance. She is currently on 3L 02 and reports typically using 2L. Recommending SNF upon discharge. Will follow acutely.    Follow Up Recommendations  SNF;Supervision/Assistance - 24 hour    Equipment Recommendations       Recommendations for Other Services       Precautions / Restrictions Precautions Precautions: Fall Restrictions Weight Bearing Restrictions: No      Mobility Bed Mobility      General bed mobility comments: received in chair  Transfers Overall transfer level: Needs assistance Equipment used: Rolling walker (2 wheeled) Transfers: Sit to/from Stand Sit to Stand: Min guard         General transfer comment: Assist for balance    Balance Overall balance assessment: Needs assistance Sitting-balance support: No upper extremity supported;Feet supported Sitting balance-Leahy Scale: Good Sitting balance - Comments: no LOB with donning socks   Standing balance support: Single extremity supported Standing balance-Leahy Scale: Poor Standing balance comment: requires at least one hand support in standing or braces knees on chair                           ADL either performed or assessed with clinical judgement   ADL Overall ADL's : Needs assistance/impaired Eating/Feeding: Independent;Sitting   Grooming: Wash/dry hands;Wash/dry face;Sitting;Set up   Upper Body Bathing: Minimal assistance;Sitting   Lower Body Bathing:  Minimal assistance;Sit to/from stand   Upper Body Dressing : Set up;Sitting   Lower Body Dressing: Minimal assistance;Sit to/from stand   Toilet Transfer: Min guard;Stand-pivot;BSC   Toileting- Water quality scientist and Hygiene: Minimal assistance;Sit to/from stand       Functional mobility during ADLs: Minimal assistance;Rolling walker General ADL Comments: Pt is knowledgeable in pursed lip breathing.     Vision Baseline Vision/History: Wears glasses Wears Glasses: Reading only Patient Visual Report: No change from baseline       Perception     Praxis      Pertinent Vitals/Pain Pain Assessment: No/denies pain Faces Pain Scale: No hurt     Hand Dominance Right   Extremity/Trunk Assessment Upper Extremity Assessment Upper Extremity Assessment: Overall WFL for tasks assessed   Lower Extremity Assessment Lower Extremity Assessment: Defer to PT evaluation       Communication Communication Communication: HOH   Cognition Arousal/Alertness: Awake/alert Behavior During Therapy: WFL for tasks assessed/performed Overall Cognitive Status: Impaired/Different from baseline Area of Impairment: Memory;Safety/judgement;Following commands;Problem solving                     Memory: Decreased short-term memory;Decreased recall of precautions Following Commands: Follows multi-step commands consistently Safety/Judgement: Decreased awareness of safety;Decreased awareness of deficits   Problem Solving: Requires verbal cues;Requires tactile cues     General Comments       Exercises     Shoulder Instructions      Home Living Family/patient expects to be discharged to:: Skilled nursing facility Living Arrangements: Other relatives (grandchildren)  Home Equipment: [place] - 2 wheels;Shower seat - built in;Grab bars - toilet;Bedside commode          Prior Functioning/Environment Level of Independence: Independent         Comments: pt reports sitting to shower, able to perform self care and ambulate independently        OT Problem List: Decreased activity tolerance;Impaired balance (sitting and/or standing);Decreased cognition;Decreased safety awareness;Decreased knowledge of use of DME or AE      OT Treatment/Interventions: Self-care/ADL training;DME and/or AE instruction;Energy conservation;Balance training;Patient/family education    OT Goals(Current goals can be found in the care plan section) Acute Rehab OT Goals Patient Stated Goal: to go home OT Goal Formulation: With patient Time For Goal Achievement: 02/10/17 Potential to Achieve Goals: Good  OT Frequency: Min 2X/week   Barriers to D/C:            Co-evaluation              AM-PAC PT "6 Clicks" Daily Activity     Outcome Measure Help from another person eating meals?: None Help from another person taking care of personal grooming?: A Little Help from another person toileting, which includes using toliet, bedpan, or urinal?: A Little Help from another person bathing (including washing, rinsing, drying)?: A Little Help from another person to put on and taking off regular upper body clothing?: None Help from another person to put on and taking off regular lower body clothing?: A Little 6 Click Score: 20   End of Session Equipment Utilized During Treatment: Gait belt;Rolling walker  Activity Tolerance: Patient tolerated treatment well Patient left: in chair;with call bell/phone within reach;with chair alarm set  OT Visit Diagnosis: Unsteadiness on feet (R26.81);Muscle weakness (generalized) (M62.81);Other symptoms and signs involving cognitive function                Time: 0539-7673 OT Time Calculation (min): 12 min Charges:  OT General Charges $OT Visit: 1 Procedure OT Evaluation $OT Eval Moderate Complexity: 1 Procedure G-Codes:       Malka So 01/27/2017, 10:22 AM  [phone]

## 2017-01-27 NOTE — Care Management Note (Signed)
Case Management Note  Patient Details  Name: CORY RAMA MRN: [id number] Date of Birth: 27-Apr-1951  Subjective/Objective:  Pt Presented for COPD- Pt/ Family is refusing SNF at this time. Pt was previously active with [facility]-was d/c from them. CM did speak with Wyvonne Lenz and she provides 24 hour supervision for patient. Pt has DME 02 via [place]. Per family will bring tank for transport home via car.                   Action/Plan: New referral was made to [facility] and [facility] to begin within 24-48 hours post d/c. No further needs from CM at this time.    Expected Discharge Date:                  Expected Discharge Plan:  [place] (Refusing SNF)  In-House Referral:  Clinical Social Work  Discharge planning Services  CM Consult  Post Acute Care Choice:  Home Health Choice offered to:  [facility] POA / Guardian  DME Arranged:  N/A DME Agency:  NA  HH Arranged:  RN, PT, Nurse's Aide [place] Agency:  [facility]  Status of Service:  Completed, signed off  If discussed at [place] of Stay Meetings, dates discussed:    Additional Comments:  Bethena Roys, RN 01/27/2017, 3:04 PM

## 2017-01-27 NOTE — Discharge Instructions (Signed)

## 2017-01-27 NOTE — Discharge Summary (Signed)
Physician Discharge Summary  Olivia Randall [facility] WUJ:[id number] DOB: 26-Aug-1951  PCP: Octavio Graves, DO  Admit date: 01/23/2017 Discharge date: 01/27/2017  Recommendations for Outpatient Follow-up:  1. Dr. Octavio Graves, PCP in 4 days with repeat labs (CBC & BMP). 2. May pursue further evaluation of aneurysm noted on MRI brain as outpatient as deemed necessary. 3. Recommend repeating chest x-ray in 4 weeks.  Home Health: RN, PT and nurse's aide. Patient's family declined SNF. Equipment/Devices: None    Discharge Condition: Improved and stable  CODE STATUS: Full  Diet recommendation: Heart healthy diet.  Discharge Diagnoses:  Principal Problem:   COPD with acute exacerbation ([place]) Active Problems:   OSA (obstructive sleep apnea)   Tobacco use disorder   Hyponatremia   Essential hypertension   Acute on chronic respiratory failure with hypoxia and hypercapnia (HCC)   Altered mental status   Normocytic anemia   Brief Summary: 66 year old female, reports that she lives with her grandchildren and ambulates with the help of a walker, COPD, chronic respiratory failure on home oxygen 2 L/m (changed March after hospitalization to 3-5L/min), OSA on? CPAP, dementia, multiple sclerosis, CAD, HTN, anxiety & depression, chronic back and neck pain, GERD, unreliable historian, brought by EMS for confusion/unresponsiveness, EKG in the field interpreted as STEMI, however this was canceled by cardiology on arrival and chest pain denied, admitted for acute on chronic hypoxic and hypercapnic respiratory failure suspected due to polypharmacy versus COPD exacerbation or combination of both.    Assessment & Plan:   1. Toxic metabolic encephalopathy: Suspected due to acute on chronic hypoxic and hypercapnic respiratory failure complicated by polypharmacy use-alprazolam and opioids/oxycodone. Started on BiPAP and then changed to when necessary. Minimized psychotropic medications. MRI brain without acute  findings. Low index of suspicion for infectious etiology. As per discussion with daughter, mental status is back to baseline at this time. Family counseled to keep medications away from her reach, they are to administer, avoid unnecessary overmedication. 2. COPD exacerbation: Treated with bronchodilator nebulizations, oxygen, BiPAP and Solu-Medrol. Tobacco cessation counseled. Improved. Transitioned to oral prednisone taper at discharge. Pro-calcitonin <0.1 and antibiotics discontinued. 3. Acute on chronic hypoxic and hypercapnic respiratory failure: Precipitated by COPD exacerbation and polypharmacy. Management as above. Briefly required BiPAP on admission and has been off for >24 hours. Return home on prior oxygen 2 L/m continuously. 4. CAD/abnormal EKG: Abnormal EKG found by EMS was activated STEMI code but cardiology evaluated EKG and canceled code. No chest pain reported. Troponins 4 negative. Echo without wall motion abnormalities and preserved EF. As per pharmacy update, patient has not been on Plavix for greater than a year and hence was discontinued. 5. Anxiety & depression:  patient not on Paxil PTA. As per verification with patient's granddaughter with whom patient lives and granddaughter provides medications, patient was getting Xanax 0.5 MG up to 4 times daily when necessary for anxiety and many times took 4 doses per day. To avoid benzodiazepine withdrawal complications, reinitiated reduced dose of Xanax at 0.25 MG 3 times a day when necessary. Patient has not complained of pain or significant anxiety in the hospital. Has not required any opioids. Recommend gradual weaning off of benzodiazepines as outpatient. Family was counseled to discard prior supply of Xanax. New prescription with a short supply of Xanax 0.25 MG was provided until patient follows up with PCP. Opioids were discontinued. 6. Dementia: Continue Aricept. 7. Chronic pain syndrome:  please see discussion above.  8. OSA:  as per  discussion with family, patient has  been noncompliant with CPAP and has not used it in several years. 9. Essential hypertension: Controlled. 10. Normocytic anemia, chronic: Stable. 11. 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 Radiology recommends nonemergent CTA or MRA, may pursue as outpatient.     Consultants:  None   Procedures:  None   Discharge Instructions  Discharge Instructions    Call MD for:    Complete by:  As directed    Worsening confusion or altered mental status.   Call MD for:  difficulty breathing, headache or visual disturbances    Complete by:  As directed    Call MD for:  extreme fatigue    Complete by:  As directed    Call MD for:  persistant dizziness or light-headedness    Complete by:  As directed    Call MD for:  severe uncontrolled pain    Complete by:  As directed    Diet - low sodium heart healthy    Complete by:  As directed    Discharge instructions    Complete by:  As directed    Continue prior home oxygen at 2 L/m via nasal cannula, continuously.   Increase activity slowly    Complete by:  As directed        Medication List    STOP taking these medications   cyclobenzaprine 10 MG tablet Commonly known as:  FLEXERIL   ENSURE   guaiFENesin-codeine 100-10 MG/5ML syrup Commonly known as:  ROBITUSSIN AC   ipratropium-albuterol 0.5-2.5 (3) MG/3ML Soln Commonly known as:  DUONEB   loratadine 10 MG tablet Commonly known as:  CLARITIN   oxyCODONE 15 MG immediate release tablet Commonly known as:  ROXICODONE   PARoxetine 40 MG tablet Commonly known as:  PAXIL   potassium chloride 10 MEQ tablet Commonly known as:  K-DUR   predniSONE 10 MG (21) Tbpk tablet Commonly known as:  STERAPRED UNI-PAK 21 TAB Replaced by:  predniSONE 10 MG tablet   sucralfate 1 g tablet Commonly known as:  CARAFATE     TAKE these medications   acetaminophen 325 MG tablet Commonly known as:  TYLENOL Take 2 tablets (650 mg  total) by mouth every 6 (six) hours as needed for mild pain, moderate pain, fever or headache (or Fever >/= 101).   ALPRAZolam 0.25 MG tablet Commonly known as:  XANAX Take 1 tablet (0.25 mg total) by mouth 3 (three) times daily as needed for anxiety. Avoid if sleepy or sedated. What changed:  medication strength  how much to take  when to take this  additional instructions   donepezil 10 MG tablet Commonly known as:  ARICEPT Take 10 mg by mouth at bedtime.   esomeprazole 40 MG capsule Commonly known as:  NEXIUM Take 40 mg by mouth daily.   fluticasone-salmeterol 230-21 MCG/ACT inhaler Commonly known as:  ADVAIR HFA Inhale 2 puffs into the lungs 2 (two) times daily.   predniSONE 10 MG tablet Commonly known as:  DELTASONE Take 4 tabs daily for 3 days, then 3 tabs daily for 3 days, then 2 tabs daily for 3 days, then 1 tab daily for 3 days, then stop. Start taking on:  01/28/2017 Replaces:  predniSONE 10 MG (21) Tbpk tablet   PROAIR HFA 108 (90 Base) MCG/ACT inhaler Generic drug:  albuterol Inhale 2 puffs into the lungs every 4 (four) hours as needed for wheezing or shortness of breath.   albuterol (2.5 MG/3ML) 0.083% nebulizer solution Commonly known  as:  PROVENTIL Take 2.5 mg by nebulization every 6 (six) hours as needed for wheezing or shortness of breath.   tiotropium 18 MCG inhalation capsule Commonly known as:  SPIRIVA Place 1 capsule (18 mcg total) into inhaler and inhale daily. What changed:  when to take this      [facility] Follow up.   Why:  Registered Nurse, Physical Therapy, Aide Contact information: [address] [place] [address] [phone]        Octavio Graves, DO. Schedule an appointment as soon as possible for a visit in 4 day(s).   Why:  To be seen with repeat labs (CBC & BMP). Contact information: [address] [place] [address] [phone]          Allergies   Allergen Reactions  . Clopidogrel Other (See Comments)    Confusion and behavioral changes (altered)      Procedures/Studies: Dg Chest 2 View  Result Date: 01/26/2017 CLINICAL DATA:  Shortness of breath. EXAM: CHEST  2 VIEW COMPARISON:  Radiograph of Jan 23, 2017. FINDINGS: Stable cardiomediastinal silhouette. No pneumothorax is noted. Minimal right pleural effusion is noted. Bibasilar subsegmental atelectasis is noted. Bony thorax is unremarkable. IMPRESSION: Mild bibasilar subsegmental atelectasis. Minimal right pleural effusion. Electronically Signed   By: Marijo Conception, M.D.   On: 01/26/2017 13:40   Ct Head Wo Contrast  Result Date: 01/24/2017 CLINICAL DATA:  Confusion. EXAM: CT HEAD WITHOUT CONTRAST TECHNIQUE: Contiguous axial images were obtained from the base of the skull through the vertex without intravenous contrast. COMPARISON:  Head CT 06/26/2014 FINDINGS: Brain: No evidence of acute infarction, hemorrhage, hydrocephalus, extra-axial collection or mass lesion/mass effect. Vascular: No hyperdense vessel or unexpected calcification. Skull: No fracture or focal lesion. Sinuses/Orbits: Paranasal sinuses and mastoid air cells are clear. The visualized orbits are unremarkable. Other: None. IMPRESSION: No acute intracranial abnormality. Electronically Signed   By: Jeb Levering M.D.   On: 01/24/2017 01:39   Mr Brain Wo Contrast  Result Date: 01/24/2017 CLINICAL DATA:  Confusion, assess acute encephalopathy. History of multiple sclerosis, hypertension and dementia. EXAM: MRI HEAD WITHOUT CONTRAST TECHNIQUE: Multiplanar, multiecho pulse sequences of the brain and surrounding structures were obtained without intravenous contrast. COMPARISON:  CT HEAD Jan 24, 2017 at 0132 hours FINDINGS: All sequences are moderately or severely motion degraded. Per technologist note, motion increased after medication. BRAIN: No reduced diffusion to suggest acute ischemia. No susceptibility artifact to  suggest hemorrhage. The ventricles and sulci are normal for patient's age. Patchy supratentorial and pontine white matter T2 hyperintensities. No suspicious parenchymal signal, masses or mass effect. No abnormal extra-axial fluid collections. VASCULAR: Normal major intracranial vascular flow voids present at skull base. 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 SKULL AND UPPER CERVICAL SPINE: No abnormal sellar expansion. No suspicious calvarial bone marrow signal. Craniocervical junction maintained. SINUSES/ORBITS: The mastoid air-cells and included paranasal sinuses are well-aerated. The included ocular globes and orbital contents are non-suspicious. OTHER: Patient is edentulous. IMPRESSION: No acute intracranial process on this motion degraded examination. Moderate chronic small vessel ischemic disease. 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 Recommend nonemergent CTA or MRA angiography when patient is able to remain still. Electronically Signed   By: Elon Alas M.D.   On: 01/24/2017 20:34   Dg Chest Port 1 View  Result Date: 01/23/2017 CLINICAL DATA:  Acute shortness of breath today. EXAM: PORTABLE CHEST 1 VIEW COMPARISON:  12/11/2016  and prior chest radiographs FINDINGS: The cardiomediastinal silhouette is unchanged. Right basilar atelectasis/scarring again noted. There is no evidence of focal airspace disease, pulmonary edema, suspicious pulmonary nodule/mass, pleural effusion, or pneumothorax. No acute bony abnormalities are identified. Cervical spine surgical hardware again noted. IMPRESSION: No evidence of acute cardiopulmonary disease. Electronically Signed   By: Margarette Canada M.D.   On: 01/23/2017 21:50      Subjective: Patient denies dyspnea. No chest pain, cough, dizziness or lightheadedness reported. States that she plans to go home with her granddaughter. Charge nurse on the floor discussed in detail with patient's granddaughter regarding her home Xanax regimen. As per  clinical social worker discussion with patient's daughter and granddaughter, they wish for patient to return home and declined SNF.  Discharge Exam:  Vitals:   01/27/17 0439 01/27/17 0749 01/27/17 0940 01/27/17 1045  BP: 139/69 (!) 146/78  130/85  Pulse: 80 65  78  Resp: '18 19  18  '$ Temp: 98.4 F (36.9 C)     TempSrc: Oral     SpO2: 98%  98%   Weight: 46.1 kg (101 lb 9.6 oz)     Height:        General exam: Pleasant middle-aged female, small built, frail, lying comfortably propped up in bed. Respiratory system: Clear to auscultation without wheezing, rhonchi or crackles. Respiratory effort normal. Cardiovascular system: S1 & S2 heard, RRR. No JVD, murmurs, rubs, gallops or clicks. No pedal edema. Telemetry: Sinus rhythm. Gastrointestinal system: Abdomen is nondistended, soft and nontender. No organomegaly or masses felt. Normal bowel sounds heard. Central nervous system: Alert and oriented self and partly to place. No focal neurological deficits. Extremities: Symmetric 5 x 5 power. Skin: No rashes, lesions or ulcers Psychiatry: Judgement and insight impaired. Mood & affect appropriate.    The results of significant diagnostics from this hospitalization (including imaging, microbiology, ancillary and laboratory) are listed below for reference.     Microbiology: Recent Results (from the past 240 hour(s))  MRSA PCR Screening     Status: None   Collection Time: 01/24/17  1:55 AM  Result Value Ref Range Status   MRSA by PCR NEGATIVE NEGATIVE Final    Comment:        The GeneXpert MRSA Assay (FDA approved for NASAL specimens only), is one component of a comprehensive MRSA colonization surveillance program. It is not intended to diagnose MRSA infection nor to guide or monitor treatment for MRSA infections.      Labs: CBC:  Recent Labs Lab 01/23/17 2133 01/23/17 2152 01/24/17 0213 01/25/17 0754 01/26/17 0520  WBC 10.7*  --  8.5 7.9 6.9  NEUTROABS 9.4*  --   --  7.2   --   HGB 10.8* 12.9 10.9* 10.5* 10.0*  HCT 38.8 38.0 38.3 37.3 33.6*  MCV 93.3  --  92.3 88.8 86.8  PLT 174  --  172 168 673   Basic Metabolic Panel:  Recent Labs Lab 01/23/17 2129 01/23/17 2152 01/24/17 0213 01/25/17 0754 01/26/17 0520  NA 133* 133* 136 135 134*  K 4.0 3.9 4.5 5.0 4.2  CL 86* 83* 87* 89* 89*  CO2 42*  --  45* 40* 42*  GLUCOSE 92 90 132* 117* 105*  BUN <5* 6 <5* 9 9  CREATININE 0.51 0.70 0.58 0.48 0.46  CALCIUM 10.0  --  10.2 9.9 9.9  MG  --   --   --  1.7  --    Liver Function Tests:  Recent Labs Lab 01/23/17 2129  01/24/17 0213 01/25/17 0754  AST '19 19 17  '$ ALT 15 14 11*  ALKPHOS 81 81 70  BILITOT 0.5 0.5 0.5  PROT 6.1* 6.2* 6.0*  ALBUMIN 2.8* 3.0* 2.9*   BNP (last 3 results)  Recent Labs  01/29/16 1500 09/05/16 0033 10/26/16 0526  BNP 36.0 36.0 11.0   Cardiac Enzymes:  Recent Labs Lab 01/24/17 0213 01/24/17 1619 01/24/17 2154 01/25/17 0356  TROPONINI <0.03 <0.03 <0.03 <0.03   Urinalysis    Component Value Date/Time   COLORURINE YELLOW 01/24/2017 [place] 01/24/2017 2340   LABSPEC 1.011 01/24/2017 2340   PHURINE 6.0 01/24/2017 2340   GLUCOSEU NEGATIVE 01/24/2017 2340   HGBUR NEGATIVE 01/24/2017 2340   BILIRUBINUR NEGATIVE 01/24/2017 2340   KETONESUR NEGATIVE 01/24/2017 2340   PROTEINUR NEGATIVE 01/24/2017 2340   UROBILINOGEN 0.2 03/18/2015 0630   NITRITE NEGATIVE 01/24/2017 2340   LEUKOCYTESUR NEGATIVE 01/24/2017 2340      Time coordinating discharge: Over 30 minutes  SIGNED:  Vernell Leep, MD, FACP, [place]. [facility] Pager [phone]  If 7PM-7AM, please contact night-coverage [email or web address] Password [facility] 01/27/2017, 3:37 PM

## 2017-01-27 NOTE — Progress Notes (Signed)
Clinical Social Worker spoke to patients daughter and granddaughter via phone. Daughter stated she wants her daughter Cyril Mourning) to make the health decision in regards to the patient since the granddaughter has been the one taking care of patient. CSW spoke to granddaughter Cyril Mourning and she stated she would like patient to discharge home with home health to follow. Cyril Mourning stated she will be home to help with patients care. Cyril Mourning stated that she would like [facility] to follow patient in the home since they have experience with the agency. CSW made MD and RNCM aware of family's decision.  Rhea Pink, MSW,  [place]

## 2017-01-27 NOTE — Care Management Important Message (Signed)
Important Message  Patient Details  Name: Olivia Randall MRN: [id number] Date of Birth: 07/07/1951   Medicare Important Message Given:  Yes    Nathen May 01/27/2017, 11:25 AM

## 2017-01-27 NOTE — Progress Notes (Signed)
Physical Therapy Treatment Patient Details Name: Olivia Randall MRN: [id number] DOB: [birth year] Today's Date: 01/27/2017    History of Present Illness Pt adm with acute toxic metabolic encephalopathy and acute on chronic respiratory failure. Pt on bipap at times. PMH - dementia, multiple sclerosis, COPD on home O2, CAD, and HTN     PT Comments    Pt making steady progress. Continue to recommend ST-SNF for continued rehab.   Follow Up Recommendations  SNF     Equipment Recommendations  None recommended by PT    Recommendations for Other Services       Precautions / Restrictions Precautions Precautions: Fall Restrictions Weight Bearing Restrictions: No    Mobility  Bed Mobility Overal bed mobility: Modified Independent Bed Mobility: Supine to Sit     Supine to sit: Modified independent (Device/Increase time);HOB elevated        Transfers Overall transfer level: Needs assistance Equipment used: Rolling walker (2 wheeled) Transfers: Sit to/from Stand Sit to Stand: Min guard         General transfer comment: Assist for balance  Ambulation/Gait Ambulation/Gait assistance: Min guard Ambulation Distance (Feet): 100 Feet Assistive device: Rolling walker (2 wheeled) Gait Pattern/deviations: Step-through pattern;Decreased step length - right;Decreased step length - left;Trunk flexed;Narrow base of support Gait velocity: decr Gait velocity interpretation: Below normal speed for age/gender General Gait Details: Assist for balance and support. Amb on 3L O2 with SpO2 94%. 1 standing rest break.   Stairs            Wheelchair Mobility    Modified Rankin (Stroke Patients Only)       Balance Overall balance assessment: Needs assistance Sitting-balance support: No upper extremity supported;Feet supported Sitting balance-Leahy Scale: Good     Standing balance support: Single extremity supported Standing balance-Leahy Scale: Poor Standing balance comment:  UE support and min A for static standing                            Cognition Arousal/Alertness: Awake/alert Behavior During Therapy: WFL for tasks assessed/performed Overall Cognitive Status: Impaired/Different from baseline Area of Impairment: Memory;Safety/judgement;Following commands;Problem solving                     Memory: Decreased short-term memory;Decreased recall of precautions Following Commands: Follows multi-step commands consistently Safety/Judgement: Decreased awareness of safety;Decreased awareness of deficits   Problem Solving: Requires verbal cues;Requires tactile cues        Exercises      General Comments        Pertinent Vitals/Pain Faces Pain Scale: No hurt    Home Living                      Prior Function            PT Goals (current goals can now be found in the care plan section) Progress towards PT goals: Progressing toward goals    Frequency    Min 2X/week      PT Plan Current plan remains appropriate    Co-evaluation              AM-PAC PT "6 Clicks" Daily Activity  Outcome Measure  Difficulty turning over in bed (including adjusting bedclothes, sheets and blankets)?: None Difficulty moving from lying on back to sitting on the side of the bed? : None Difficulty sitting down on and standing up from a chair with arms (e.g., wheelchair, bedside commode,  etc,.)?: Total Help needed moving to and from a bed to chair (including a wheelchair)?: A Little Help needed walking in hospital room?: A Little Help needed climbing 3-5 steps with a railing? : Total 6 Click Score: 16    End of Session Equipment Utilized During Treatment: Oxygen Activity Tolerance: Patient limited by fatigue Patient left: with call bell/phone within reach;in chair;with chair alarm set Nurse Communication: Mobility status PT Visit Diagnosis: Unsteadiness on feet (R26.81);Muscle weakness (generalized) (M62.81);Difficulty in  walking, not elsewhere classified (R26.2)     Time: 8850-2774 PT Time Calculation (min) (ACUTE ONLY): 19 min  Charges:  $Gait Training: 8-22 mins                    G Codes:       [facility] PT [place] 01/27/2017, 9:31 AM

## 2017-02-15 ENCOUNTER — Telehealth: Payer: Self-pay | Admitting: Physician Assistant

## 2017-02-16 ENCOUNTER — Ambulatory Visit: Payer: Self-pay | Admitting: Physician Assistant

## 2017-02-21 ENCOUNTER — Encounter: Payer: Self-pay | Admitting: Physician Assistant

## 2017-03-10 ENCOUNTER — Inpatient Hospital Stay (HOSPITAL_COMMUNITY)
Admission: AD | Admit: 2017-03-10 | Discharge: 2017-03-15 | DRG: 208 | Disposition: A | Discharge status: Skilled Nursing Facility | Payer: Medicare HMO | Source: Other Acute Inpatient Hospital | Attending: Internal Medicine | Admitting: Internal Medicine

## 2017-03-10 ENCOUNTER — Inpatient Hospital Stay (HOSPITAL_COMMUNITY): Payer: Medicare HMO

## 2017-03-10 DIAGNOSIS — Z9049 Acquired absence of other specified parts of digestive tract: Secondary | ICD-10-CM

## 2017-03-10 DIAGNOSIS — G4730 Sleep apnea, unspecified: Secondary | ICD-10-CM | POA: Diagnosis present

## 2017-03-10 DIAGNOSIS — E440 Moderate protein-calorie malnutrition: Secondary | ICD-10-CM | POA: Diagnosis present

## 2017-03-10 DIAGNOSIS — Z888 Allergy status to other drugs, medicaments and biological substances status: Secondary | ICD-10-CM | POA: Diagnosis not present

## 2017-03-10 DIAGNOSIS — Z681 Body mass index (BMI) 19 or less, adult: Secondary | ICD-10-CM

## 2017-03-10 DIAGNOSIS — D649 Anemia, unspecified: Secondary | ICD-10-CM | POA: Diagnosis present

## 2017-03-10 DIAGNOSIS — Z79899 Other long term (current) drug therapy: Secondary | ICD-10-CM

## 2017-03-10 DIAGNOSIS — G35 Multiple sclerosis: Secondary | ICD-10-CM | POA: Diagnosis present

## 2017-03-10 DIAGNOSIS — G92 Toxic encephalopathy: Secondary | ICD-10-CM | POA: Diagnosis present

## 2017-03-10 DIAGNOSIS — Z01818 Encounter for other preprocedural examination: Secondary | ICD-10-CM

## 2017-03-10 DIAGNOSIS — Z4659 Encounter for fitting and adjustment of other gastrointestinal appliance and device: Secondary | ICD-10-CM

## 2017-03-10 DIAGNOSIS — J9602 Acute respiratory failure with hypercapnia: Secondary | ICD-10-CM | POA: Diagnosis not present

## 2017-03-10 DIAGNOSIS — Z87891 Personal history of nicotine dependence: Secondary | ICD-10-CM | POA: Diagnosis not present

## 2017-03-10 DIAGNOSIS — M549 Dorsalgia, unspecified: Secondary | ICD-10-CM | POA: Diagnosis present

## 2017-03-10 DIAGNOSIS — J9601 Acute respiratory failure with hypoxia: Secondary | ICD-10-CM

## 2017-03-10 DIAGNOSIS — J441 Chronic obstructive pulmonary disease with (acute) exacerbation: Principal | ICD-10-CM | POA: Diagnosis present

## 2017-03-10 DIAGNOSIS — I10 Essential (primary) hypertension: Secondary | ICD-10-CM | POA: Diagnosis present

## 2017-03-10 DIAGNOSIS — B965 Pseudomonas (aeruginosa) (mallei) (pseudomallei) as the cause of diseases classified elsewhere: Secondary | ICD-10-CM | POA: Diagnosis not present

## 2017-03-10 DIAGNOSIS — K219 Gastro-esophageal reflux disease without esophagitis: Secondary | ICD-10-CM | POA: Diagnosis present

## 2017-03-10 DIAGNOSIS — E876 Hypokalemia: Secondary | ICD-10-CM | POA: Diagnosis not present

## 2017-03-10 DIAGNOSIS — J9622 Acute and chronic respiratory failure with hypercapnia: Secondary | ICD-10-CM | POA: Diagnosis present

## 2017-03-10 DIAGNOSIS — F0390 Unspecified dementia without behavioral disturbance: Secondary | ICD-10-CM | POA: Diagnosis present

## 2017-03-10 DIAGNOSIS — Z90710 Acquired absence of both cervix and uterus: Secondary | ICD-10-CM

## 2017-03-10 DIAGNOSIS — Z9981 Dependence on supplemental oxygen: Secondary | ICD-10-CM

## 2017-03-10 DIAGNOSIS — L89151 Pressure ulcer of sacral region, stage 1: Secondary | ICD-10-CM | POA: Diagnosis present

## 2017-03-10 DIAGNOSIS — J9600 Acute respiratory failure, unspecified whether with hypoxia or hypercapnia: Secondary | ICD-10-CM

## 2017-03-10 DIAGNOSIS — J9621 Acute and chronic respiratory failure with hypoxia: Secondary | ICD-10-CM | POA: Diagnosis present

## 2017-03-10 DIAGNOSIS — F419 Anxiety disorder, unspecified: Secondary | ICD-10-CM | POA: Diagnosis present

## 2017-03-10 DIAGNOSIS — G8929 Other chronic pain: Secondary | ICD-10-CM | POA: Diagnosis present

## 2017-03-10 DIAGNOSIS — F329 Major depressive disorder, single episode, unspecified: Secondary | ICD-10-CM | POA: Diagnosis present

## 2017-03-10 DIAGNOSIS — L8990 Pressure ulcer of unspecified site, unspecified stage: Secondary | ICD-10-CM | POA: Insufficient documentation

## 2017-03-10 LAB — GLUCOSE, CAPILLARY
GLUCOSE-CAPILLARY: 91 mg/dL (ref 65–99)
Glucose-Capillary: 92 mg/dL (ref 65–99)

## 2017-03-10 LAB — CBC WITH DIFFERENTIAL/PLATELET
BASOS ABS: 0 10*3/uL (ref 0.0–0.1)
Basophils Relative: 0 %
EOS ABS: 0 10*3/uL (ref 0.0–0.7)
EOS PCT: 0 %
HCT: 32.9 % — ABNORMAL LOW (ref 36.0–46.0)
HEMOGLOBIN: 9.6 g/dL — AB (ref 12.0–15.0)
LYMPHS ABS: 0.2 10*3/uL — AB (ref 0.7–4.0)
Lymphocytes Relative: 2 %
MCH: 25.8 pg — ABNORMAL LOW (ref 26.0–34.0)
MCHC: 29.2 g/dL — ABNORMAL LOW (ref 30.0–36.0)
MCV: 88.4 fL (ref 78.0–100.0)
Monocytes Absolute: 0.9 10*3/uL (ref 0.1–1.0)
Monocytes Relative: 6 %
NEUTROS PCT: 92 %
Neutro Abs: 13.2 10*3/uL — ABNORMAL HIGH (ref 1.7–7.7)
PLATELETS: 142 10*3/uL — AB (ref 150–400)
RBC: 3.72 MIL/uL — AB (ref 3.87–5.11)
RDW: 15.8 % — ABNORMAL HIGH (ref 11.5–15.5)
WBC: 14.3 10*3/uL — AB (ref 4.0–10.5)

## 2017-03-10 LAB — COMPREHENSIVE METABOLIC PANEL
ALT: 47 U/L (ref 14–54)
AST: 32 U/L (ref 15–41)
Albumin: 2.3 g/dL — ABNORMAL LOW (ref 3.5–5.0)
Alkaline Phosphatase: 101 U/L (ref 38–126)
Anion gap: 4 — ABNORMAL LOW (ref 5–15)
BUN: 21 mg/dL — ABNORMAL HIGH (ref 6–20)
CHLORIDE: 99 mmol/L — AB (ref 101–111)
CO2: 32 mmol/L (ref 22–32)
CREATININE: 0.5 mg/dL (ref 0.44–1.00)
Calcium: 10 mg/dL (ref 8.9–10.3)
Glucose, Bld: 115 mg/dL — ABNORMAL HIGH (ref 65–99)
POTASSIUM: 3.7 mmol/L (ref 3.5–5.1)
SODIUM: 135 mmol/L (ref 135–145)
Total Bilirubin: 0.5 mg/dL (ref 0.3–1.2)
Total Protein: 4.9 g/dL — ABNORMAL LOW (ref 6.5–8.1)

## 2017-03-10 LAB — MAGNESIUM: MAGNESIUM: 1.6 mg/dL — AB (ref 1.7–2.4)

## 2017-03-10 LAB — TRIGLYCERIDES: TRIGLYCERIDES: 82 mg/dL (ref <150)

## 2017-03-10 LAB — MRSA PCR SCREENING: MRSA by PCR: NEGATIVE

## 2017-03-10 LAB — LACTIC ACID, PLASMA: LACTIC ACID, VENOUS: 0.5 mmol/L (ref 0.5–1.9)

## 2017-03-10 LAB — PHOSPHORUS: PHOSPHORUS: 2.9 mg/dL (ref 2.5–4.6)

## 2017-03-10 LAB — TROPONIN I: Troponin I: 0.08 ng/mL (ref <0.03)

## 2017-03-10 MED ORDER — DEXTROSE 5 % IV SOLN
1.0000 g | Freq: Two times a day (BID) | INTRAVENOUS | Status: DC
  Administered 2017-03-10 – 2017-03-15 (×10): 1 g via INTRAVENOUS
  Filled 2017-03-10 (×11): qty 1

## 2017-03-10 MED ORDER — METHYLPREDNISOLONE SODIUM SUCC 40 MG IJ SOLR
40.0000 mg | Freq: Three times a day (TID) | INTRAMUSCULAR | Status: DC
  Administered 2017-03-10 – 2017-03-11 (×2): 40 mg via INTRAVENOUS
  Filled 2017-03-10 (×3): qty 1

## 2017-03-10 MED ORDER — PANTOPRAZOLE SODIUM 40 MG IV SOLR
40.0000 mg | Freq: Every day | INTRAVENOUS | Status: DC
  Administered 2017-03-10 – 2017-03-12 (×3): 40 mg via INTRAVENOUS
  Filled 2017-03-10 (×3): qty 40

## 2017-03-10 MED ORDER — FENTANYL CITRATE (PF) 100 MCG/2ML IJ SOLN
50.0000 ug | INTRAMUSCULAR | Status: DC | PRN
  Administered 2017-03-10 – 2017-03-11 (×2): 50 ug via INTRAVENOUS
  Filled 2017-03-10 (×2): qty 2

## 2017-03-10 MED ORDER — PROPOFOL 1000 MG/100ML IV EMUL: 5.0000 ug/kg/min | INTRAVENOUS | Status: DC

## 2017-03-10 MED ORDER — SODIUM CHLORIDE 0.9 % IV SOLN: 250.0000 mL | INTRAVENOUS | Status: DC | PRN

## 2017-03-10 MED ORDER — CHLORHEXIDINE GLUCONATE 0.12% ORAL RINSE (MEDLINE KIT)
15.0000 mL | Freq: Two times a day (BID) | OROMUCOSAL | Status: DC
  Administered 2017-03-10 – 2017-03-11 (×2): 15 mL via OROMUCOSAL

## 2017-03-10 MED ORDER — MAGNESIUM SULFATE 2 GM/50ML IV SOLN
2.0000 g | Freq: Once | INTRAVENOUS | Status: AC
  Administered 2017-03-10: 2 g via INTRAVENOUS
  Filled 2017-03-10: qty 50

## 2017-03-10 MED ORDER — ORAL CARE MOUTH RINSE
15.0000 mL | OROMUCOSAL | Status: DC
  Administered 2017-03-10 – 2017-03-11 (×9): 15 mL via OROMUCOSAL

## 2017-03-10 MED ORDER — METHYLPREDNISOLONE SODIUM SUCC 40 MG IJ SOLR
40.0000 mg | Freq: Two times a day (BID) | INTRAMUSCULAR | Status: DC
  Filled 2017-03-10: qty 1

## 2017-03-10 MED ORDER — IPRATROPIUM-ALBUTEROL 0.5-2.5 (3) MG/3ML IN SOLN
3.0000 mL | Freq: Four times a day (QID) | RESPIRATORY_TRACT | Status: DC
  Administered 2017-03-10 – 2017-03-13 (×11): 3 mL via RESPIRATORY_TRACT
  Filled 2017-03-10 (×11): qty 3

## 2017-03-10 MED ORDER — SODIUM CHLORIDE 0.9 % IV SOLN
INTRAVENOUS | Status: DC
  Administered 2017-03-10 – 2017-03-11 (×3): via INTRAVENOUS

## 2017-03-10 MED ORDER — PROPOFOL 1000 MG/100ML IV EMUL: 0.0000 ug/kg/min | INTRAVENOUS | Status: DC

## 2017-03-10 MED ORDER — HEPARIN SODIUM (PORCINE) 5000 UNIT/ML IJ SOLN
5000.0000 [IU] | Freq: Three times a day (TID) | INTRAMUSCULAR | Status: DC
  Administered 2017-03-10 – 2017-03-15 (×14): 5000 [IU] via SUBCUTANEOUS
  Filled 2017-03-10 (×16): qty 1

## 2017-03-10 MED ORDER — ARFORMOTEROL TARTRATE 15 MCG/2ML IN NEBU
15.0000 ug | INHALATION_SOLUTION | Freq: Two times a day (BID) | RESPIRATORY_TRACT | Status: DC
  Administered 2017-03-10 – 2017-03-15 (×10): 15 ug via RESPIRATORY_TRACT
  Filled 2017-03-10 (×11): qty 2

## 2017-03-10 MED ORDER — FENTANYL CITRATE (PF) 100 MCG/2ML IJ SOLN
50.0000 ug | INTRAMUSCULAR | Status: DC | PRN
  Administered 2017-03-12: 12.5 ug via INTRAVENOUS
  Filled 2017-03-10: qty 2

## 2017-03-10 MED ORDER — BUDESONIDE 0.5 MG/2ML IN SUSP
0.5000 mg | Freq: Two times a day (BID) | RESPIRATORY_TRACT | Status: DC
Start: 2017-03-10 — End: 2017-03-16
  Administered 2017-03-10 – 2017-03-15 (×10): 0.5 mg via RESPIRATORY_TRACT
  Filled 2017-03-10 (×11): qty 2

## 2017-03-10 NOTE — Progress Notes (Signed)
Pharmacy Antibiotic Note  Olivia Randall is a 66 y.o. female admitted on 03/10/2017 with pneumonia.  Pharmacy has been consulted for cefepime dosing. Current inpatient labs are pending but SCr reported as 0.37 and WBC 14 at OSH.   Plan: Cefepime 1gm IV Q12H F/u renal fxn, C&S, clinical status   Height: 5\' 3"  (160 cm) Weight: 111 lb 6.4 oz (50.5 kg) IBW/kg (Calculated) : 52.4  No data recorded.  No results for input(s): WBC, CREATININE, LATICACIDVEN, VANCOTROUGH, VANCOPEAK, VANCORANDOM, GENTTROUGH, GENTPEAK, GENTRANDOM, TOBRATROUGH, TOBRAPEAK, TOBRARND, AMIKACINPEAK, AMIKACINTROU, AMIKACIN in the last 168 hours.  CrCl cannot be calculated (Patient's most recent lab result is older than the maximum 21 days allowed.).    Allergies  Allergen Reactions  . Clopidogrel Other (See Comments)    Confusion and behavioral changes (altered)    Antimicrobials this admission: Cefepime 6/28>>  Dose adjustments this admission: N/A  Microbiology results: Pending  Thank you for allowing pharmacy to be a part of this patient's care.  Briza Bark, Rande Lawman 03/10/2017 2:16 PM

## 2017-03-10 NOTE — Progress Notes (Signed)
eLink Physician-Brief Progress Note Patient Name: Olivia Randall DOB: 1950/09/23 MRN: [id number]   Date of Service  03/10/2017  HPI/Events of Note  hypomag.  eICU Interventions  Mag sulfate 2gm iv x1     Intervention Category Intermediate Interventions: Electrolyte abnormality - evaluation and management  Tera Partridge 03/10/2017, 5:00 PM

## 2017-03-10 NOTE — H&P (Signed)
PULMONARY / CRITICAL CARE MEDICINE   Name: MORGANA ROWLEY MRN: [id number] DOB: 05-25-1951    ADMISSION DATE:  03/10/2017   REFERRING MD:  Benson Norway  CHIEF COMPLAINT:  Respiratory failure   HISTORY OF PRESENT ILLNESS:  Pt is encephelopathic; therefore, this HPI is obtained from chart review. SHADONNA BENEDICK is a 66 y.o. F with PMH as outlined below.  She was admitted to [facility] on 6/24 for AMS. She was found to have acute on chronic hypercapnic respiratory failure and required intubation. Apparently one of the family members had mentioned that she might have taken excessive medications the night prior and it was unclear if this was suicidal attempt or not. Apparently patient lost her husband 2 months prior and ever since then she had basically lost her will to live. She lives with her grandchildren and family members had also expressed concern as to whether or not her grandchildren with taking her medications and selling them as well as not taking care of patient appropriately.  After admission, she had been attempted on SBT daily; however, performed poorly. On 6/28, she did better than she previously had done; therefore, she was weaned and extubated. Unfortunately within 15 minutes, she started decline and was unable to maintain O2 saturations >80%. She was subsequently reintubated and was later transferred to [facility] for further evaluation and management.  Per DC summary, sputum cultures from [facility] were positive for Pseudomonas for which she was being treated with cefepime. In addition she had been maintained with sedation using propofol and Ativan. During admission at [facility] exam is identified at one of patient's granddaughter's Sharma Covert, phone # [phone]) is patient's power of attorney.  Patient also has sister who is very involved in her care Gerrit Friends, phone # [phone]).  Of note, she had recent admission to [facility] 5/13 through 5/17 or COPD  exacerbation versus acute on chronic hypoxic and hypercapnic respiratory failure suspected due to polypharmacy, versus combination of both. In addition, she was admitted in December 2017 where she required intubation.   PAST MEDICAL HISTORY :  She  has a past medical history of Anxiety and depression; Asthma; Chronic back pain; Chronic neck pain; COPD (chronic obstructive pulmonary disease) ([place]); Dementia; Depression; GERD (gastroesophageal reflux disease); Hypertension; MS (multiple sclerosis) ([place]); MS (multiple sclerosis) ([place]); On home O2; Shortness of breath dyspnea; and Sleep apnea.  PAST SURGICAL HISTORY: She  has a past surgical history that includes Abdominal hysterectomy; Bladder surgery; Hemorrhoid surgery; Carpal tunnel release; Neck surgery; Shoulder surgery (Right); Esophagogastroduodenoscopy (2009); Cholecystectomy; Breast surgery; Esophagogastroduodenoscopy (egd) with propofol (N/A, 04/24/2015); maloney dilation (N/A, 04/24/2015); and biopsy (04/24/2015).  Allergies  Allergen Reactions  . Clopidogrel Other (See Comments)    Confusion and behavioral changes (altered)    No current facility-administered medications on file prior to encounter.    Current Outpatient Prescriptions on File Prior to Encounter  Medication Sig  . acetaminophen (TYLENOL) 325 MG tablet Take 2 tablets (650 mg total) by mouth every 6 (six) hours as needed for mild pain, moderate pain, fever or headache (or Fever >/= 101).  [name] albuterol (PROAIR HFA) 108 (90 Base) MCG/ACT inhaler Inhale 2 puffs into the lungs every 4 (four) hours as needed for wheezing or shortness of breath.   [name] albuterol (PROVENTIL) (2.5 MG/3ML) 0.083% nebulizer solution Take 2.5 mg by nebulization every 6 (six) hours as needed for wheezing or shortness of breath.  . ALPRAZolam (XANAX) 0.25 MG tablet Take 1 tablet (0.25 mg total) by  mouth 3 (three) times daily as needed for anxiety. Avoid if sleepy or sedated.  . donepezil (ARICEPT) 10 MG  tablet Take 10 mg by mouth at bedtime.   [name] esomeprazole (NEXIUM) 40 MG capsule Take 40 mg by mouth daily.  . fluticasone-salmeterol (ADVAIR HFA) 230-21 MCG/ACT inhaler Inhale 2 puffs into the lungs 2 (two) times daily.  . predniSONE (DELTASONE) 10 MG tablet Take 4 tabs daily for 3 days, then 3 tabs daily for 3 days, then 2 tabs daily for 3 days, then 1 tab daily for 3 days, then stop.  [name] tiotropium (SPIRIVA) 18 MCG inhalation capsule Place 1 capsule (18 mcg total) into inhaler and inhale daily.    FAMILY HISTORY:  Her indicated that the status of her mother is unknown. She indicated that the status of her father is unknown. She indicated that the status of her brother is unknown. She indicated that the status of her neg hx is unknown.    SOCIAL HISTORY: She  reports that she has quit smoking. Her smoking use included Cigarettes. She has never used smokeless tobacco. She reports that she does not drink alcohol or use drugs.  REVIEW OF SYSTEMS:   Unable to obtain as pt is encephalopathic  SUBJECTIVE:  On vent, unresponsive.  VITAL SIGNS: There were no vitals taken for this visit.  HEMODYNAMICS:    VENTILATOR SETTINGS:    INTAKE / OUTPUT: No intake/output data recorded.  PHYSICAL EXAMINATION: General:  Chronically ill appearing female, in NAD. Neuro:  Sedated, unable to follow commands. HEENT:  [place] / AT.  MMM. Cardiovascular:  RRR, no M/R/G. Lungs:  Normal respiratory effort.  CTAB. Abdomen:  BS x 4, S/NT/ND. Musculoskeletal:  No deformities, no edema. Skin:  Warm, dry.  LABS:  BMET No results for input(s): NA, K, CL, CO2, BUN, CREATININE, GLUCOSE in the last 168 hours.  Electrolytes No results for input(s): CALCIUM, MG, PHOS in the last 168 hours.  CBC No results for input(s): WBC, HGB, HCT, PLT in the last 168 hours.  Coag's No results for input(s): APTT, INR in the last 168 hours.  Sepsis Markers No results for input(s): LATICACIDVEN, PROCALCITON, O2SATVEN in  the last 168 hours.  ABG No results for input(s): PHART, PCO2ART, PO2ART in the last 168 hours.  Liver Enzymes No results for input(s): AST, ALT, ALKPHOS, BILITOT, ALBUMIN in the last 168 hours.  Cardiac Enzymes No results for input(s): TROPONINI, PROBNP in the last 168 hours.  Glucose No results for input(s): GLUCAP in the last 168 hours.  Imaging No results found.   STUDIES:  CXR 6/28 >   CULTURES: Sputum (from [place]) > Pseudomonas  ANTIBIOTICS: Cefepime (unclear start date) >  Doxy (unclear start date) > 6/28.  SIGNIFICANT EVENTS: 6/24 > admit to [facility], intubated. 6/28 > extubated but failed > re-intubated and transferred to [facility].  LINES/TUBES: ETT 6/24 > 6/28.  6/28 >   DISCUSSION: 66 y.o. F with PMH COPD admitted to [facility] him on 6/24 for acute on chronic hypercapnic and hypoxic respiratory failure requiring intubation. 6/28 attempted extubation but failed and required reintubation. Later transferred to [facility] further evaluation and management.  ASSESSMENT / PLAN:  PULMONARY A: Acute on chronic hypercapnic and hypoxemic respiratory failure - s/p intubation at [facility] him 6/24. Extubated 6/28; however, failed and required emergent reintubation. Pseudomonas pneumonia - based off sputum culture from [facility]. AECOPD. Former smoker. P:   Continue full vent support. Assess ABG. We'll need to discuss goals  of care with family (with the patient would want tracheostomy unable to be liberated from the ventilator). Continue cefepime. Continue BD's, steroids. CXR in AM. Continue tobacco cessation.  CARDIOVASCULAR A:  Hx HTN. P:  Monitor hemodynamics.  RENAL A:   No acute issues. P:   NS @ 75. BMP in AM.  GASTROINTESTINAL A:   GI prophylaxis. Nutrition. P:   SUP: Pantoprazole. NPO.  HEMATOLOGIC A:   VTE prophylaxis. P:  SCDs / heparin. CBC in AM.  INFECTIOUS A:   Pseudomonas pneumonia - based off sputum  culture from [facility]. P:   Continue cefepime.  ENDOCRINE A:   No acute issues. P:   No interventions required.  NEUROLOGIC A:   Acute encephalopathy - due to sedation. Hx MS, depression, anxiety, dementia, chronic neck and back pain, polypharmacy. P:   Sedation: Propofol gtt / Fentanyl PRN. RASS goal: 0 to -1. Daily WUA. Avoid sedating meds. Hold preadmission donepezil, alprazolam, oxycodone, prozac.   FAMILY  - Updates: Attempted to call pt's HCPOA (Christine Bullins) but no answer.  Called pt's sister Gerrit Friends) and updated her on pt's condition.  Also mentioned that we need to have discussions with Altha Harm given that she is HCPOA regarding goals of care / how to move forward from here.  For now, pt listed as full code until further discussions with Altha Harm can be had.  Will attempt to call her back later this afternoon.  - Inter-disciplinary family meet or Palliative Care meeting due by:  03/16/17.  CC time: 30 min.   Montey Hora, [place] [facility] Pager: [phone]  or [phone] 03/10/2017, 2:25 PM

## 2017-03-10 NOTE — Progress Notes (Signed)
PCCM Interval Progress Note  Spoke with pt's granddaughter and HCPOA, Altha Harm Bullins regarding pt's code status and goals of care. Altha Harm would like to speak with her mother before she comes to the hospital later this evening, roughly around 1630.  She will have an update for [place] at that time.   Montey Hora, [place] [facility] Pager: [phone]  or [phone] 03/10/2017, 2:57 PM

## 2017-03-11 ENCOUNTER — Inpatient Hospital Stay (HOSPITAL_COMMUNITY): Payer: Medicare HMO

## 2017-03-11 DIAGNOSIS — Z01818 Encounter for other preprocedural examination: Secondary | ICD-10-CM

## 2017-03-11 DIAGNOSIS — Z4659 Encounter for fitting and adjustment of other gastrointestinal appliance and device: Secondary | ICD-10-CM

## 2017-03-11 LAB — GLUCOSE, CAPILLARY
GLUCOSE-CAPILLARY: 101 mg/dL — AB (ref 65–99)
GLUCOSE-CAPILLARY: 107 mg/dL — AB (ref 65–99)
GLUCOSE-CAPILLARY: 84 mg/dL (ref 65–99)
GLUCOSE-CAPILLARY: 97 mg/dL (ref 65–99)
Glucose-Capillary: 94 mg/dL (ref 65–99)
Glucose-Capillary: 104 mg/dL — ABNORMAL HIGH (ref 65–99)
Glucose-Capillary: 85 mg/dL (ref 65–99)

## 2017-03-11 LAB — PHOSPHORUS: PHOSPHORUS: 2.1 mg/dL — AB (ref 2.5–4.6)

## 2017-03-11 LAB — BASIC METABOLIC PANEL
ANION GAP: 5 (ref 5–15)
BUN: 20 mg/dL (ref 6–20)
CALCIUM: 9.5 mg/dL (ref 8.9–10.3)
CO2: 32 mmol/L (ref 22–32)
Chloride: 100 mmol/L — ABNORMAL LOW (ref 101–111)
Creatinine, Ser: 0.51 mg/dL (ref 0.44–1.00)
Glucose, Bld: 108 mg/dL — ABNORMAL HIGH (ref 65–99)
Potassium: 3.7 mmol/L (ref 3.5–5.1)
SODIUM: 137 mmol/L (ref 135–145)

## 2017-03-11 LAB — POCT I-STAT 3, ART BLOOD GAS (G3+)
ACID-BASE EXCESS: 11 mmol/L — AB (ref 0.0–2.0)
BICARBONATE: 32.9 mmol/L — AB (ref 20.0–28.0)
O2 Saturation: 94 %
PO2 ART: 58 mmHg — AB (ref 83.0–108.0)
TCO2: 34 mmol/L (ref 0–100)
pCO2 arterial: 33.7 mmHg (ref 32.0–48.0)
pH, Arterial: 7.597 — ABNORMAL HIGH (ref 7.350–7.450)

## 2017-03-11 LAB — MAGNESIUM: Magnesium: 1.9 mg/dL (ref 1.7–2.4)

## 2017-03-11 LAB — CBC
HEMATOCRIT: 29.7 % — AB (ref 36.0–46.0)
Hemoglobin: 8.7 g/dL — ABNORMAL LOW (ref 12.0–15.0)
MCH: 25.4 pg — ABNORMAL LOW (ref 26.0–34.0)
MCHC: 29.3 g/dL — ABNORMAL LOW (ref 30.0–36.0)
MCV: 86.6 fL (ref 78.0–100.0)
PLATELETS: 145 10*3/uL — AB (ref 150–400)
RBC: 3.43 MIL/uL — ABNORMAL LOW (ref 3.87–5.11)
RDW: 15.6 % — AB (ref 11.5–15.5)
WBC: 11.1 10*3/uL — AB (ref 4.0–10.5)

## 2017-03-11 MED ORDER — POTASSIUM PHOSPHATES 15 MMOLE/5ML IV SOLN
30.0000 mmol | Freq: Once | INTRAVENOUS | Status: AC
  Administered 2017-03-11: 30 mmol via INTRAVENOUS
  Filled 2017-03-11: qty 10

## 2017-03-11 MED ORDER — METHYLPREDNISOLONE SODIUM SUCC 40 MG IJ SOLR
40.0000 mg | Freq: Two times a day (BID) | INTRAMUSCULAR | Status: DC
  Administered 2017-03-11 – 2017-03-12 (×2): 40 mg via INTRAVENOUS
  Filled 2017-03-11 (×2): qty 1

## 2017-03-11 NOTE — Procedures (Signed)
Extubation Procedure Note  Patient Details:   Name: Olivia Randall DOB: 22-Mar-1951 MRN: [id number]   Airway Documentation:     Evaluation  O2 sats: stable throughout Complications: No apparent complications Patient did tolerate procedure well. Bilateral Breath Sounds: Clear   Yes, SPO2 98% on 4L [name] 03/11/2017, 11:57 AM

## 2017-03-11 NOTE — Progress Notes (Signed)
PULMONARY / CRITICAL CARE MEDICINE   Name: Olivia Randall MRN: [id number] DOB: Apr 21, 1951    ADMISSION DATE:  03/10/2017   REFERRING MD:  Benson Norway  CHIEF COMPLAINT:  Respiratory failure   HISTORY OF PRESENT ILLNESS:  Pt is encephelopathic; therefore, this HPI is obtained from chart review. Olivia Randall is a 66 y.o. F with PMH as outlined below.  She was admitted to [facility] on 6/24 for AMS. She was found to have acute on chronic hypercapnic respiratory failure and required intubation. Apparently one of the family members had mentioned that she might have taken excessive medications the night prior and it was unclear if this was suicidal attempt or not. Apparently patient lost her husband 2 months prior and ever since then she had basically lost her will to live. She lives with her grandchildren and family members had also expressed concern as to whether or not her grandchildren with taking her medications and selling them as well as not taking care of patient appropriately.  After admission, she had been attempted on SBT daily; however, performed poorly. On 6/28, she did better than she previously had done; therefore, she was weaned and extubated. Unfortunately within 15 minutes, she started decline and was unable to maintain O2 saturations >80%. She was subsequently reintubated and was later transferred to [facility] for further evaluation and management.  Per DC summary, sputum cultures from [facility] were positive for Pseudomonas for which she was being treated with cefepime. In addition she had been maintained with sedation using propofol and Ativan. During admission at [facility] exam is identified at one of patient's granddaughter's Sharma Covert, phone # [phone]) is patient's power of attorney.  Patient also has sister who is very involved in her care Gerrit Friends, phone # [phone]).  Of note, she had recent admission to [facility] 5/13 through 5/17 or COPD  exacerbation versus acute on chronic hypoxic and hypercapnic respiratory failure suspected due to polypharmacy, versus combination of both. In addition, she was admitted in December 2017 where she required intubation.     SUBJECTIVE:  Awake on vent fio2 30%, ps 5 with Vt 466  VITAL SIGNS: BP (!) 149/73   Pulse 88   Temp 99.2 F (37.3 C) (Oral)   Resp 18   Ht 5\' 3"  (1.6 m)   Wt 112 lb 7 oz (51 kg)   SpO2 98%   BMI 19.92 kg/m   HEMODYNAMICS:    VENTILATOR SETTINGS: Vent Mode: PSV;CPAP FiO2 (%):  [30 %-50 %] 30 % Set Rate:  [12 bmp-20 bmp] 16 bmp Vt Set:  [420 mL-450 mL] 450 mL PEEP:  [5 cmH20] 5 cmH20 Pressure Support:  [5 cmH20] 5 cmH20 Plateau Pressure:  [14 cmH20-28 cmH20] 14 cmH20  INTAKE / OUTPUT: I/O last 3 completed shifts: In: 1401.3 [I.V.:1251.3; IV Piggyback:150] Out: 1005 [Urine:705; Emesis/NG output:300]  PHYSICAL EXAMINATION: General:  Thin wf nad on vent HEENT: ET-> vent PSY:nl affect Neuro: Intact CV: HSR RRR PULM: Dereased air movement AJ:OINO, non-tender, bsx4 active  Extremities: warm/dry, -edema  Skin: no rashes or lesions  LABS:  BMET  Recent Labs Lab 03/10/17 1520 03/11/17 0356  NA 135 137  K 3.7 3.7  CL 99* 100*  CO2 32 32  BUN 21* 20  CREATININE 0.50 0.51  GLUCOSE 115* 108*    Electrolytes  Recent Labs Lab 03/10/17 1520 03/11/17 0356  CALCIUM 10.0 9.5  MG 1.6* 1.9  PHOS 2.9 2.1*    CBC  Recent Labs Lab  03/10/17 1520 03/11/17 0356  WBC 14.3* 11.1*  HGB 9.6* 8.7*  HCT 32.9* 29.7*  PLT 142* 145*    Coag's No results for input(s): APTT, INR in the last 168 hours.  Sepsis Markers  Recent Labs Lab 03/10/17 1520  LATICACIDVEN 0.5    ABG  Recent Labs Lab 03/11/17 0259  PHART 7.597*  PCO2ART 33.7  PO2ART 58.0*    Liver Enzymes  Recent Labs Lab 03/10/17 1520  AST 32  ALT 47  ALKPHOS 101  BILITOT 0.5  ALBUMIN 2.3*    Cardiac Enzymes  Recent Labs Lab 03/10/17 1520  TROPONINI 0.08*     Glucose  Recent Labs Lab 03/10/17 1458 03/10/17 2025 03/11/17 0026 03/11/17 0429 03/11/17 0800 03/11/17 1126  GLUCAP 92 91 101* 94 104* 107*    Imaging Dg Chest Port 1 View  Result Date: 03/11/2017 CLINICAL DATA:  Respiratory failure. EXAM: PORTABLE CHEST 1 VIEW COMPARISON:  03/10/2017, 03/10/2017.  CT 12/05/2016 . FINDINGS: Endotracheal tube has been withdrawn minimally common the endotracheal tube remains in a low position its tip is 7 mm above the carina. Heart size stable. Stable chronic interstitial changes. Stable pleural-parenchymal thickening consistent with scarring . Reference is made to priors chest CT report 12/05/2016 for discussion of small ill-defined density in the left upper lung. No pneumothorax . No acute bony abnormality. IMPRESSION: 1. Endotracheal tube has been withdrawn minimally, endotracheal tube remains in a low position with its tip 7 mm above the carina. NG tube in stable position. 2. Chronic interstitial changes with pleural-parenchymal scarring. 3. Reference is made to prior chest CT report 12/05/2016 for discussion of small ill-defined left upper lobe lesion. These results will be called to the ordering clinician or representative by the Radiologist Assistant, and communication documented in the PACS or zVision Dashboard. Electronically Signed   By: Marcello Moores  Register   On: 03/11/2017 06:27   Dg Chest Port 1 View  Result Date: 03/10/2017 CLINICAL DATA:  Respiratory failure, intubated patient. EXAM: PORTABLE CHEST 1 VIEW COMPARISON:  Portable chest x-ray of March 10, 2017 at 10:43 a.m. FINDINGS: The lungs are hyperinflated with hemidiaphragm flattening. There are small bilateral pleural effusions. The heart and pulmonary vascularity are normal. There is calcification in the wall of the aortic arch. The endotracheal tube tip lies at the origin of the right mainstem bronchus. The esophagogastric tube tip projects below the inferior margin of the image. IMPRESSION:  Again demonstrated is low positioning of the endotracheal tube. Withdrawal by 3-4 cm is recommended to avoid accidental right mainstem bronchus intubation. The esophagogastric tube tip projects below the inferior margin of the image. The remainder of the study is stable. These results will be called to the ordering clinician or representative by the Radiologist Assistant, and communication documented in the PACS or zVision Dashboard. Electronically Signed   By: David  Martinique M.D.   On: 03/10/2017 14:55   Dg Abd Portable 1v  Result Date: 03/10/2017 CLINICAL DATA:  Assess orogastric tube placement. EXAM: PORTABLE ABDOMEN - 1 VIEW COMPARISON:  Chest x-ray of today's date FINDINGS: The orogastric tube tip in proximal port lie in the region of the gastric body. There are few loops of mildly distended gas-filled bowel within the abdomen. The stool and gas in the rectum. IMPRESSION: The esophagogastric tube appears be appropriately positioned within the stomach. Electronically Signed   By: David  Martinique M.D.   On: 03/10/2017 14:56     STUDIES:  CXR 6/29 > Changes of COPD. Tube low  CULTURES:  Sputum (from [place]) > Pseudomonas  ANTIBIOTICS: Cefepime (unclear start date) >  Doxy (unclear start date) > 6/28.  SIGNIFICANT EVENTS: 6/24 > admit to [facility], intubated. 6/28 > extubated but failed > re-intubated and transferred to [facility].  LINES/TUBES: ETT 6/24 > 6/28.  6/28 > 6/29  DISCUSSION: 65 y.o. F with PMH COPD admitted to [facility] him on 6/24 for acute on chronic hypercapnic and hypoxic respiratory failure requiring intubation. 6/28 attempted extubation but failed and required reintubation. Later transferred to [facility] further evaluation and management.  ASSESSMENT / PLAN:  PULMONARY A: Acute on chronic hypercapnic and hypoxemic respiratory failure - s/p intubation at [facility] him 6/24. Extubated 6/28; however, failed and required emergent reintubation. Pseudomonas  pneumonia - based off sputum culture from [facility]. AECOPD. Former smoker. P:   Extubate 6/29. We'll need to discuss goals of care with family and pt concerning re intubation Continue cefepime. Continue BD's, steroids. CXR in AM.   CARDIOVASCULAR A:  Hx HTN. P:  Monitor hemodynamics.  RENAL A:   No acute issues. P:   NS @ 75. BMP in AM.  GASTROINTESTINAL A:   GI prophylaxis. Nutrition. P:   SUP: Pantoprazole. NPO. Sips and chips and advance diet as tolerated  HEMATOLOGIC A:   VTE prophylaxis. P:  SCDs / heparin. CBC in AM.  INFECTIOUS A:   Pseudomonas pneumonia - based off sputum culture from [facility]. P:   Continue cefepime.  ENDOCRINE A:   No acute issues. P:   No interventions required.  NEUROLOGIC A:   Acute encephalopathy - due to sedation. Hx MS, depression, anxiety, dementia, chronic neck and back pain, polypharmacy. P:   Sedation: Propofol gtt / Fentanyl PRN. RASS goal: 0 to -1. Daily WUA. Avoid sedating meds. Hold preadmission donepezil, alprazolam, oxycodone, prozac. Awake and alert 6/29 will extubate   FAMILY  - Updates: Attempted to call pt's HCPOA (Christine Bullins) but no answer.  Called pt's sister Gerrit Friends) and updated her on pt's condition.  Also mentioned that we need to have discussions with Altha Harm given that she is HCPOA regarding goals of care / how to move forward from here.  For now, pt listed as full code until further discussions with Altha Harm can be had.  Will attempt to call her back later this afternoon.  - Inter-disciplinary family meet or Palliative Care meeting due by:  03/16/17.  CC time: 30 min.  Richardson Landry Minor ACNP Maryanna Shape PCCM Pager [phone] till 3 pm If no answer page [phone] 03/11/2017, 11:30 AM   STAFF NOTE: I, Merrie Roof, MD FACP have personally reviewed patient's available data, including medical history, events of note, physical examination and test results as part of my  evaluation. I have discussed with resident/NP and other care providers such as pharmacist, RN and RRT. In addition, I personally evaluated patient and elicited key findings of: awake, fc, coarse bs distant, no active wheezing, abdo soft, pcxr which I reviewed revealed hazzines rt base, chronic int changes, ett wnl, neuro is good status awake, alert, clinical h/o copd with asp of TF, pseudomonas in sputum without well prior defined infiltrate, purulent bronchitis is DX, consider 8 days therapy, without active bronchospasm would avoid steroids as able with pseudomonas, reduce roids, wean cpap 5 ps5, goal 1 hour assess rsbi, pcxr follow up, would prefer even balance today, full wua, May need slp The patient is critically ill with multiple organ systems failure and requires high complexity decision making for assessment and support,  frequent evaluation and titration of therapies, application of advanced monitoring technologies and extensive interpretation of multiple databases.   Critical Care Time devoted to patient care services described in this note is30 Minutes. This time reflects time of care of this signee: Merrie Roof, MD FACP. This critical care time does not reflect procedure time, or teaching time or supervisory time of PA/NP/Med student/Med Resident etc but could involve care discussion time. Rest per NP/medical resident whose note is outlined above and that I agree with   Lavon Paganini. Titus Mould, MD, [place] Pgr: [place] [facility] 03/11/2017 1:48 PM

## 2017-03-11 NOTE — Progress Notes (Signed)
[place] Progress Note Patient Name: Olivia Randall DOB: [birth year] MRN: [id number]   Date of Service  03/11/2017  HPI/Events of Note  Hypophos and potassium of 3.7  eICU Interventions  Phos and potassium replaced     Intervention Category Intermediate Interventions: Electrolyte abnormality - evaluation and management  DETERDING,ELIZABETH 03/11/2017, 5:14 AM

## 2017-03-12 ENCOUNTER — Encounter (HOSPITAL_COMMUNITY): Payer: Self-pay

## 2017-03-12 DIAGNOSIS — J9600 Acute respiratory failure, unspecified whether with hypoxia or hypercapnia: Secondary | ICD-10-CM

## 2017-03-12 DIAGNOSIS — E440 Moderate protein-calorie malnutrition: Secondary | ICD-10-CM | POA: Insufficient documentation

## 2017-03-12 LAB — GLUCOSE, CAPILLARY
GLUCOSE-CAPILLARY: 105 mg/dL — AB (ref 65–99)
GLUCOSE-CAPILLARY: 116 mg/dL — AB (ref 65–99)
GLUCOSE-CAPILLARY: 75 mg/dL (ref 65–99)
GLUCOSE-CAPILLARY: 89 mg/dL (ref 65–99)
Glucose-Capillary: 75 mg/dL (ref 65–99)

## 2017-03-12 LAB — PROCALCITONIN: PROCALCITONIN: 0.16 ng/mL

## 2017-03-12 MED ORDER — METHYLPREDNISOLONE SODIUM SUCC 40 MG IJ SOLR
40.0000 mg | Freq: Every day | INTRAMUSCULAR | Status: DC
  Administered 2017-03-13: 40 mg via INTRAVENOUS
  Filled 2017-03-12: qty 1

## 2017-03-12 MED ORDER — FUROSEMIDE 10 MG/ML IJ SOLN
20.0000 mg | Freq: Two times a day (BID) | INTRAMUSCULAR | Status: DC
  Administered 2017-03-12 – 2017-03-13 (×3): 20 mg via INTRAVENOUS
  Filled 2017-03-12 (×4): qty 2

## 2017-03-12 MED ORDER — ACETAMINOPHEN 325 MG PO TABS
650.0000 mg | ORAL_TABLET | Freq: Four times a day (QID) | ORAL | Status: DC | PRN
  Administered 2017-03-12 – 2017-03-15 (×5): 650 mg via ORAL
  Filled 2017-03-12 (×6): qty 2

## 2017-03-12 MED ORDER — ENSURE ENLIVE PO LIQD
237.0000 mL | Freq: Two times a day (BID) | ORAL | Status: DC
  Administered 2017-03-13 – 2017-03-15 (×6): 237 mL via ORAL

## 2017-03-12 NOTE — Progress Notes (Signed)
PULMONARY / CRITICAL CARE MEDICINE   Name: Olivia Randall MRN: [id number] DOB: March 25, 1951    ADMISSION DATE:  03/10/2017   REFERRING MD:  Benson Norway  CHIEF COMPLAINT:  Respiratory failure   HISTORY OF PRESENT ILLNESS:  Pt is encephelopathic; therefore, this HPI is obtained from chart review. Olivia Randall is a 66 y.o. F with PMH as outlined below.  She was admitted to [facility] on 6/24 for AMS. She was found to have acute on chronic hypercapnic respiratory failure and required intubation. Apparently one of the family members had mentioned that she might have taken excessive medications the night prior and it was unclear if this was suicidal attempt or not. Apparently patient lost her husband 2 months prior and ever since then she had basically lost her will to live. She lives with her grandchildren and family members had also expressed concern as to whether or not her grandchildren with taking her medications and selling them as well as not taking care of patient appropriately.  After admission, she had been attempted on SBT daily; however, performed poorly. On 6/28, she did better than she previously had done; therefore, she was weaned and extubated. Unfortunately within 15 minutes, she started decline and was unable to maintain O2 saturations >80%. She was subsequently reintubated and was later transferred to [facility] for further evaluation and management.  Per DC summary, sputum cultures from [facility] were positive for Pseudomonas for which she was being treated with cefepime. In addition she had been maintained with sedation using propofol and Ativan. During admission at [facility] exam is identified at one of patient's granddaughter's Sharma Covert, phone # [phone]) is patient's power of attorney.  Patient also has sister who is very involved in her care Gerrit Friends, phone # [phone]).  Of note, she had recent admission to [facility] 5/13 through 5/17 or COPD  exacerbation versus acute on chronic hypoxic and hypercapnic respiratory failure suspected due to polypharmacy, versus combination of both. In addition, she was admitted in December 2017 where she required intubation.   SUBJECTIVE:  Extubated No distress  VITAL SIGNS: BP 132/73   Pulse 72   Temp 97.6 F (36.4 C) (Oral)   Resp (!) 30   Ht 5\' 3"  (1.6 m)   Wt 51.9 kg (114 lb 8 oz)   SpO2 100%   BMI 20.28 kg/m   HEMODYNAMICS:    VENTILATOR SETTINGS:    INTAKE / OUTPUT: I/O last 3 completed shifts: In: 3225 [I.V.:2625; Other:500; IV Piggyback:100] Out: 2060 [Urine:1460; Emesis/NG output:300; Other:300]  PHYSICAL EXAMINATION: General: sleeping Neuro: nonfocal, alert, appropriate HEENT: jvd wnl PULM: no active bronchospasm CV: s1 s2 RRR not tachy GI: soft, bs wnl, no r Extremities: no edema   LABS:  BMET  Recent Labs Lab 03/10/17 1520 03/11/17 0356  NA 135 137  K 3.7 3.7  CL 99* 100*  CO2 32 32  BUN 21* 20  CREATININE 0.50 0.51  GLUCOSE 115* 108*    Electrolytes  Recent Labs Lab 03/10/17 1520 03/11/17 0356  CALCIUM 10.0 9.5  MG 1.6* 1.9  PHOS 2.9 2.1*    CBC  Recent Labs Lab 03/10/17 1520 03/11/17 0356  WBC 14.3* 11.1*  HGB 9.6* 8.7*  HCT 32.9* 29.7*  PLT 142* 145*    Coag's No results for input(s): APTT, INR in the last 168 hours.  Sepsis Markers  Recent Labs Lab 03/10/17 1520  LATICACIDVEN 0.5    ABG  Recent Labs Lab 03/11/17 0259  PHART  7.597*  PCO2ART 33.7  PO2ART 58.0*    Liver Enzymes  Recent Labs Lab 03/10/17 1520  AST 32  ALT 47  ALKPHOS 101  BILITOT 0.5  ALBUMIN 2.3*    Cardiac Enzymes  Recent Labs Lab 03/10/17 1520  TROPONINI 0.08*    Glucose  Recent Labs Lab 03/11/17 0800 03/11/17 1126 03/11/17 1536 03/11/17 1930 03/11/17 2349 03/12/17 0347  GLUCAP 104* 107* 85 84 97 89    Imaging No results found.   STUDIES:  CXR 6/29 > Changes of COPD. Tube low  CULTURES: Sputum (from  [place]) > Pseudomonas  ANTIBIOTICS: Cefepime (unclear start date) >  Doxy (unclear start date) > 6/28.  SIGNIFICANT EVENTS: 6/24 > admit to [facility], intubated. 6/28 > extubated but failed > re-intubated and transferred to [facility].  LINES/TUBES: ETT 6/24 > 6/28.  6/28 > 6/29  DISCUSSION: 66 y.o. F with PMH COPD admitted to [facility] him on 6/24 for acute on chronic hypercapnic and hypoxic respiratory failure requiring intubation. 6/28 attempted extubation but failed and required reintubation. Later transferred to [facility] further evaluation and management.  ASSESSMENT / PLAN:  PULMONARY A: Acute on chronic hypercapnic and hypoxemic respiratory failure - s/p intubation at [facility] him 6/24. Extubated 6/28; however, failed and required emergent reintubation. Pseudomonas pneumonia - based off sputum culture from [facility]. AECOPD. Former smoker. P:   BDers Slow reduction steroids Would favor even balance to neg abx SLP needed  CARDIOVASCULAR A:  Hx HTN. P:  Consider dc tele  RENAL A:   No acute issues. P:   NS @ 75 kvo Low threshold lasix with int changes pcxr BMP in AM.  GASTROINTESTINAL A:   GI prophylaxis. Nutrition. R/o dysphagia P:   SUP: Pantoprazole. NPO. slp  HEMATOLOGIC A:   VTE prophylaxis. P:  SCDs / heparin. CBC in AM.  INFECTIOUS A:   Pseudomonas pneumonia - based off sputum culture from [facility]. P:   Continue cefepime, in am if remains culture neg ,consider CTX and total abx 5 days if pct neg x 2, order pct assessment for am  ENDOCRINE A:   No acute issues. P:   cbg in am   NEUROLOGIC A:   Acute encephalopathy - due to sedation. Hx MS, depression, anxiety, dementia, chronic neck and back pain, polypharmacy. P:   Pt needed Stagger restart when oral able, preadmission donepezil, alprazolam, oxycodone, prozac.   FAMILY  - Updates: update to pt  - Inter-disciplinary family meet or Palliative  Care meeting due by:  03/16/17.   Lavon Paganini. Titus Mould, MD, [place] Pgr: [facility] 03/12/2017 8:16 AM

## 2017-03-12 NOTE — Progress Notes (Signed)
Foley dcd tolerated well

## 2017-03-12 NOTE — Progress Notes (Signed)
Report given to RN Elmyra Ricks, [place] room 5.

## 2017-03-12 NOTE — Progress Notes (Signed)
Initial Nutrition Assessment  DOCUMENTATION CODES:  Non-severe (moderate) malnutrition in context of chronic illness  INTERVENTION:  Ensure Enlive po BID, each supplement provides 350 kcal and 20 grams of protein  NUTRITION DIAGNOSIS:  Malnutrition related to chronic illness (COPD/dementia) as evidenced by severe muscle depletion and moderate fat depletion   GOAL:  Patient will meet greater than or equal to 90% of their needs  MONITOR:  PO intake, Supplement acceptance, Diet advancement, Labs, Weight trends  REASON FOR ASSESSMENT:  Malnutrition Screening Tool    ASSESSMENT:  66 y/o female female PMHx Dementia, Depression, GERD, HTN, Anxiety, COPD, MS.  Admitted to [facility] on 6/24 for resp failure. Intubated. Extubated 6/28, but reintubated shortly after and transferred to [facility] icu. Potentially aspirated. Extubated 6/29 at [facility].   Pt is very confused. She says she ate this morning, but that is not the case. She contradicts herself multiple times. Unable to obtain reliable hx. She does state she is "starving". Waiting for ST to eval.   Per chart, her weight appears stable 100's x 6 months. Does appear to have lost sizeable amount of weight in long term. Down 50 lbs x 2 years.    If/when passes ST eval, order supplements.   NFPE: Moderate fat wasting, moderate to severe muscle wasting.   Meds: Lasix, Methylprednisolone, ppi, IV NS, IV abx.  Labs   Recent Labs Lab 03/10/17 1520 03/11/17 0356  NA 135 137  K 3.7 3.7  CL 99* 100*  CO2 32 32  BUN 21* 20  CREATININE 0.50 0.51  CALCIUM 10.0 9.5  MG 1.6* 1.9  PHOS 2.9 2.1*  GLUCOSE 115* 108*   Diet Order:  Diet NPO time specified  Skin: Stage 1 to sacrum   Last BM:  6/30  Height:  Ht Readings from Last 1 Encounters:  03/10/17 5\' 3"  (1.6 m)   Weight:  Wt Readings from Last 1 Encounters:  03/12/17 114 lb 8 oz (51.9 kg)   Wt Readings from Last 10 Encounters:  03/12/17 114 lb 8 oz (51.9 kg)  01/27/17 101 lb 9.6 oz  (46.1 kg)  11/30/16 124 lb (56.2 kg)  10/26/16 103 lb (46.7 kg)  09/12/16 103 lb 12.8 oz (47.1 kg)  01/29/16 136 lb (61.7 kg)  11/08/15 133 lb (60.3 kg)  10/20/15 133 lb (60.3 kg)  09/08/15 134 lb (60.8 kg)  04/24/15 144 lb (65.3 kg)   Ideal Body Weight:  52.27 kg  BMI:  Body mass index is 20.28 kg/m.  Estimated Nutritional Needs:  Kcal:  1550-1750 (30-34 kcal/kg bw) Protein:  62-73g (1.2-1.4 g/kg bw) Fluid:  >1.6 L (30 ml/kg)  EDUCATION NEEDS:  No education needs identified at this time  Burtis Junes RD, LDN, [place] [facility] Pager: [phone] 03/12/2017 12:01 PM

## 2017-03-12 NOTE — Evaluation (Signed)
Clinical/Bedside Swallow Evaluation Patient Details  Name: Olivia Randall MRN: [id number] Date of Birth: April 16, 1951  Today's Date: 03/12/2017 Time: SLP Start Time (ACUTE ONLY): 6387 SLP Stop Time (ACUTE ONLY): 1420 SLP Time Calculation (min) (ACUTE ONLY): 15 min  Past Medical History:  Past Medical History:  Diagnosis Date  . Anxiety and depression   . Asthma   . Chronic back pain   . Chronic neck pain   . COPD (chronic obstructive pulmonary disease) ([place])   . 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 Depression   . GERD (gastroesophageal reflux disease)   . Hypertension   . MS (multiple sclerosis) ([place])   . MS (multiple sclerosis) ([place])    staes xrays showed signs of ms  . On home O2    2L N/C  . Shortness of breath dyspnea   . Sleep apnea    Past Surgical History:  Past Surgical History:  Procedure Laterality Date  . ABDOMINAL HYSTERECTOMY    . BIOPSY  04/24/2015   Procedure: BIOPSY (Gastric);  Surgeon: [name], MD;  Location: [facility];  Service: Endoscopy;;  . BLADDER SURGERY    . BREAST SURGERY    . CARPAL TUNNEL RELEASE    . CHOLECYSTECTOMY    . ESOPHAGOGASTRODUODENOSCOPY  2009   Dr. Laural Golden: small sliding hiatal hernia with mild reflux esophagitis, empiric dilation with 54/56 F, nonerosive antral gastritis   . ESOPHAGOGASTRODUODENOSCOPY (EGD) WITH PROPOFOL N/A 04/24/2015   RMR: normal esophagus status post Maloney dilation. 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   [name] HEMORRHOID SURGERY    . MALONEY DILATION N/A 04/24/2015   Procedure: MALONEY ESOPHAGEAL DILATION (54FR);  Surgeon: [name], MD;  Location: [facility]S;  Service: Endoscopy;  Laterality: N/A;  . NECK SURGERY    . SHOULDER SURGERY Right    HPI:  Pt is a 66 y.o. female with PMH Anxiety and depression; Asthma; Chronic back pain; Chronic neck pain; COPD (chronic obstructive pulmonary disease) ([place]); Dementia; Depression; GERD (gastroesophageal reflux disease);  Hypertension; MS (multiple sclerosis) ([place]); MS (multiple sclerosis) ([place]); On home O2; Shortness of breath dyspnea; and Sleep apnea. Pt was admitted to [facility] on 6/24 for AMS. She was found to have acute on chronic hypercapnic respiratory failure and required intubation. Was extubated 6/28 but then declined and was reintubated and transferred to [facility]; extubated 6/29.Apparently one of the family members had mentioned that she might have taken excessive medications the night prior and it was unclear if this was suicidal attempt or not. Apparently patient lost her husband 2 months prior and ever since then she had basically lost her will to live.She lives with her grandchildren and family members had also expressed concern as to whether or not her grandchildren with taking her medications and selling them as well as not taking care of patient appropriately. Of note pt followed by speech therapy in 2017 following extubation; had FEES with recommendation of regular/ thin liquids. Bedside swallow eval ordered.    Assessment / Plan / Recommendation Clinical Impression  Pt showed no overt s/s of aspiration during PO trials at bedside. Pt did appear slightly SOB during PO trials; vocal quality remained clear. Pt able to produce a strong cough; following 1-step directions with ease and carrying on conversation. Aspiration risk is increased given that pt is post-5 day intubation. Recommend initiating regular diet, thin liquids, meds whole with liquid, intermittent supervision to ensure pt seated upright and taking small  bites/ sips. Meats chopped per pt request. Will f/u x1 to ensure diet tolerance. SLP Visit Diagnosis: Dysphagia, unspecified (R13.10)    Aspiration Risk  Mild aspiration risk;Moderate aspiration risk    Diet Recommendation Regular;Thin liquid   Liquid Administration via: Cup;Straw Medication Administration: Whole meds with liquid Supervision: Patient able to self feed;Intermittent  supervision to cue for compensatory strategies Compensations: Slow rate;Small sips/bites Postural Changes: Seated upright at 90 degrees;Remain upright for at least 30 minutes after po intake    Other  Recommendations Oral Care Recommendations: Oral care BID   Follow up Recommendations  (TBD)      Frequency and Duration min 1 x/week  1 week       Prognosis        Swallow Study   General HPI: Pt is a 66 y.o. female with PMH Anxiety and depression; Asthma; Chronic back pain; Chronic neck pain; COPD (chronic obstructive pulmonary disease) ([place]); Dementia; Depression; GERD (gastroesophageal reflux disease); Hypertension; MS (multiple sclerosis) ([place]); MS (multiple sclerosis) ([place]); On home O2; Shortness of breath dyspnea; and Sleep apnea. Pt was admitted to [facility] on 6/24 for AMS. She was found to have acute on chronic hypercapnic respiratory failure and required intubation. Was extubated 6/28 but then declined and was reintubated and transferred to [facility]; extubated 6/29.Apparently one of the family members had mentioned that she might have taken excessive medications the night prior and it was unclear if this was suicidal attempt or not. Apparently patient lost her husband 2 months prior and ever since then she had basically lost her will to live.She lives with her grandchildren and family members had also expressed concern as to whether or not her grandchildren with taking her medications and selling them as well as not taking care of patient appropriately. Of note pt followed by speech therapy in 2017 following extubation; had FEES with recommendation of regular/ thin liquids. Bedside swallow eval ordered.  Type of Study: Bedside Swallow Evaluation Previous Swallow Assessment:  (See HPI) Diet Prior to this Study: NPO Temperature Spikes Noted: No Respiratory Status: Nasal cannula History of Recent Intubation: Yes Length of Intubations (days):  (5) Date extubated:  03/11/17 Behavior/Cognition: Alert;Cooperative;Pleasant mood Oral Cavity Assessment: Within Functional Limits Oral Cavity - Dentition: Edentulous Vision: Functional for self-feeding Self-Feeding Abilities: Able to feed self Patient Positioning: Upright in bed Baseline Vocal Quality: Hoarse Volitional Cough: Strong Volitional Swallow: Able to elicit    Oral/Motor/Sensory Function Overall Oral Motor/Sensory Function: Within functional limits   Ice Chips Ice chips: Within functional limits   Thin Liquid Thin Liquid: Within functional limits Presentation: Cup;Straw Other Comments:  (slightly SOB during trials)    Nectar Thick Nectar Thick Liquid: Not tested   Honey Thick Honey Thick Liquid: Not tested   Puree Puree: Within functional limits   Solid   GO   Solid: Impaired Presentation: Self Fed Oral Phase Impairments: Impaired mastication        Kern Reap, MA, CCC-SLP 03/12/2017,2:29 PM  [phone]

## 2017-03-13 ENCOUNTER — Inpatient Hospital Stay (HOSPITAL_COMMUNITY): Payer: Medicare HMO

## 2017-03-13 DIAGNOSIS — B965 Pseudomonas (aeruginosa) (mallei) (pseudomallei) as the cause of diseases classified elsewhere: Secondary | ICD-10-CM

## 2017-03-13 DIAGNOSIS — E876 Hypokalemia: Secondary | ICD-10-CM

## 2017-03-13 LAB — GLUCOSE, CAPILLARY
GLUCOSE-CAPILLARY: 129 mg/dL — AB (ref 65–99)
Glucose-Capillary: 107 mg/dL — ABNORMAL HIGH (ref 65–99)
Glucose-Capillary: 92 mg/dL (ref 65–99)
Glucose-Capillary: 148 mg/dL — ABNORMAL HIGH (ref 65–99)
Glucose-Capillary: 127 mg/dL — ABNORMAL HIGH (ref 65–99)

## 2017-03-13 LAB — BASIC METABOLIC PANEL
Anion gap: 7 (ref 5–15)
BUN: 11 mg/dL (ref 6–20)
CALCIUM: 9.6 mg/dL (ref 8.9–10.3)
CO2: 35 mmol/L — AB (ref 22–32)
CREATININE: 0.53 mg/dL (ref 0.44–1.00)
Chloride: 97 mmol/L — ABNORMAL LOW (ref 101–111)
GFR calc non Af Amer: >60 mL/min (ref >60)
Glucose, Bld: 104 mg/dL — ABNORMAL HIGH (ref 65–99)
Potassium: 3 mmol/L — ABNORMAL LOW (ref 3.5–5.1)
SODIUM: 139 mmol/L (ref 135–145)

## 2017-03-13 LAB — MAGNESIUM: MAGNESIUM: 1.7 mg/dL (ref 1.7–2.4)

## 2017-03-13 LAB — PHOSPHORUS: Phosphorus: 2.6 mg/dL (ref 2.5–4.6)

## 2017-03-13 MED ORDER — POTASSIUM CHLORIDE CRYS ER 20 MEQ PO TBCR
40.0000 meq | EXTENDED_RELEASE_TABLET | Freq: Once | ORAL | Status: AC
  Administered 2017-03-13: 40 meq via ORAL
  Filled 2017-03-13: qty 2

## 2017-03-13 MED ORDER — PANTOPRAZOLE SODIUM 40 MG PO TBEC
40.0000 mg | DELAYED_RELEASE_TABLET | Freq: Every day | ORAL | Status: DC
  Administered 2017-03-13 – 2017-03-14 (×2): 40 mg via ORAL
  Filled 2017-03-13 (×2): qty 1

## 2017-03-13 MED ORDER — PREDNISONE 20 MG PO TABS
40.0000 mg | ORAL_TABLET | Freq: Every day | ORAL | Status: DC
  Administered 2017-03-14 – 2017-03-15 (×2): 40 mg via ORAL
  Filled 2017-03-13 (×3): qty 2

## 2017-03-13 MED ORDER — FUROSEMIDE 40 MG PO TABS
40.0000 mg | ORAL_TABLET | Freq: Every day | ORAL | Status: DC
Start: 2017-03-14 — End: 2017-03-16
  Administered 2017-03-14 – 2017-03-15 (×2): 40 mg via ORAL
  Filled 2017-03-13 (×4): qty 1

## 2017-03-13 MED ORDER — IPRATROPIUM-ALBUTEROL 0.5-2.5 (3) MG/3ML IN SOLN
3.0000 mL | Freq: Three times a day (TID) | RESPIRATORY_TRACT | Status: DC
  Administered 2017-03-13 – 2017-03-15 (×8): 3 mL via RESPIRATORY_TRACT
  Filled 2017-03-13 (×9): qty 3

## 2017-03-13 NOTE — Progress Notes (Signed)
[facility] PROGRESS NOTE  QUINLAN MCFALL PIR:[id number] DOB: 06-16-1951 DOA: 03/10/2017  PCP: Octavio Graves, DO  Brief History/Interval Summary: 66 year old Caucasian female with a past medical history of COPD, chronic respiratory failure, oxygen dependent, history of dementia, depression, GERD, hypertension, multiple sclerosis was admitted to [facility] on June 24, for altered mental status. She was found to have acute on chronic hypercapnic respiratory failure. She had to be intubated. She was treated in the ICU. Sputum cultures were apparently positive for Pseudomonas, which was treated with cefepime. She failed extubation over at the other facility and so was transferred to [facility] for higher level of care. She was admitted to the intensive care unit here. She was extubated and then subsequently transferred to the floor.  Reason for Visit: Acute respiratory failure  Consultants: Pulmonology  Procedures: None  Antibiotics: Cefepime  Subjective/Interval History: Patient states that she is feeling better. Breathing has improved. Still has cough which is mainly dry. No chest pain. No nausea or vomiting.  ROS: Denies any headache  Objective:  Vital Signs  Vitals:   03/13/17 0525 03/13/17 0729 03/13/17 0739 03/13/17 0744  BP: 135/69     Pulse: 88     Resp: 18     Temp: 97.8 F (36.6 C)     TempSrc:      SpO2: 100% 99% 99% 99%  Weight:      Height:        Intake/Output Summary (Last 24 hours) at 03/13/17 1221 Last data filed at 03/13/17 1137  Gross per 24 hour  Intake                0 ml  Output              700 ml  Net             -700 ml   Filed Weights   03/11/17 0500 03/12/17 0500 03/13/17 0500  Weight: 51 kg (112 lb 7 oz) 51.9 kg (114 lb 8 oz) 45 kg (99 lb 3.3 oz)    General appearance: alert, cooperative, 66 appears stated age and no distress Resp: Coarse breath sounds bilaterally. Few crackles at the bases. No rhonchi, few  wheezes. Cardio: regular rate and rhythm, S1, S2 normal, no murmur, click, rub or gallop GI: soft, non-tender; bowel sounds normal; no masses,  no organomegaly Extremities: extremities normal, atraumatic, no cyanosis or edema Neurologic: Awake, alert. Oriented 3. No focal neurological deficits.  Lab Results:  Data Reviewed: I have personally reviewed following labs and imaging studies  CBC:  Recent Labs Lab 03/10/17 1520 03/11/17 0356  WBC 14.3* 11.1*  NEUTROABS 13.2*  --   HGB 9.6* 8.7*  HCT 32.9* 29.7*  MCV 88.4 86.6  PLT 142* 145*    Basic Metabolic Panel:  Recent Labs Lab 03/10/17 1520 03/11/17 0356 03/13/17 0621  NA 135 137 139  K 3.7 3.7 3.0*  CL 99* 100* 97*  CO2 32 32 35*  GLUCOSE 115* 108* 104*  BUN 21* 20 11  CREATININE 0.50 0.51 0.53  CALCIUM 10.0 9.5 9.6  MG 1.6* 1.9 1.7  PHOS 2.9 2.1* 2.6    GFR: Estimated Creatinine Clearance: 48.5 mL/min (by C-G formula based on SCr of 0.53 mg/dL).  Liver Function Tests:  Recent Labs Lab 03/10/17 1520  AST 32  ALT 47  ALKPHOS 101  BILITOT 0.5  PROT 4.9*  ALBUMIN 2.3*    Cardiac Enzymes:  Recent Labs Lab 03/10/17 1520  TROPONINI  0.08*    CBG:  Recent Labs Lab 03/12/17 1150 03/12/17 2031 03/12/17 2359 03/13/17 0524 03/13/17 0746  GLUCAP 75 116* 105* 107* 92    Lipid Profile:  Recent Labs  03/10/17 1520  TRIG 82     Recent Results (from the past 240 hour(s))  MRSA PCR Screening     Status: None   Collection Time: 03/10/17  1:39 PM  Result Value Ref Range Status   MRSA by PCR NEGATIVE NEGATIVE Final    Comment:        The GeneXpert MRSA Assay (FDA approved for NASAL specimens only), is one component of a comprehensive MRSA colonization surveillance program. It is not intended to diagnose MRSA infection nor to guide or monitor treatment for MRSA infections.       Radiology Studies: Dg Chest Port 1 View  Result Date: 03/13/2017 CLINICAL DATA:  Ventilator support.   Endotracheal removal. EXAM: PORTABLE CHEST 1 VIEW COMPARISON:  03/11/2017 FINDINGS: Endotracheal tube and nasogastric tube have been removed. Background pattern of chronic pulmonary scarring. Chronic volume loss at the right base. Probable small right effusion. Left perihilar mass lesion visible on the previous CT not specifically visible by radiography. IMPRESSION: Chronic interstitial markings. Chronic volume loss at the right base. Possible small amount of pleural fluid on the right. Perihilar left upper lobe mass lesion shown at previous CT not visible by radiography. Electronically Signed   By: Nelson Chimes M.D.   On: 03/13/2017 10:32     Medications:  Scheduled: . arformoterol  15 mcg Nebulization BID  . budesonide (PULMICORT) nebulizer solution  0.5 mg Nebulization BID  . feeding supplement (ENSURE ENLIVE)  237 mL Oral BID BM  . furosemide  20 mg Intravenous BID  . heparin  5,000 Units Subcutaneous Q8H  . ipratropium-albuterol  3 mL Nebulization TID  . methylPREDNISolone (SOLU-MEDROL) injection  40 mg Intravenous Daily  . pantoprazole (PROTONIX) IV  40 mg Intravenous QHS  . potassium chloride  40 mEq Oral Once   Continuous: . sodium chloride 10 mL/hr at 03/12/17 0931  . ceFEPime (MAXIPIME) IV Stopped (03/13/17 0426)   WUX:[id number]  Assessment/Plan:  Active Problems:   COPD exacerbation (HCC)   Pressure injury of skin   Encounter for orogastric (OG) tube placement   Encounter for intubation   Malnutrition of moderate degree    Acute on chronic hypercapnic and hypoxemic respiratory failure. She was intubated at [facility] on 6/24, extubated 6/28, however, had to be reintubated and then transferred here. She was in our intensive care unit. Stabilized and extubated. Seems to be doing well. Continue oxygen. Patient noted to be on IV Lasix. This will be changed to oral. Replace potassium.  Acute COPD exacerbation. Seems to be improving. Continue to taper steroids.  Change to oral. Continue nebulizer treatments.  Pseudomonas in sputum. This was identified at the outside facility. Patient was on cefepime. No infiltrates noted on chest x-ray. This could have been. Purulent bronchitis. Discussed with Dr. Titus Mould today. He recommends about 8 days of antibiotics. Patient has completed 5 days. So we will give it for 3 more days.  Acute encephalopathy. This has resolved. Now back to baseline.  History of multiple sclerosis. Stable.  History of depression and anxiety. Stable.  History of chronic back pain. Patient is on OxyIR at home. Seems to be stable currently.  Normocytic anemia. Hemoglobin noted to be slightly lower than her usual baseline. No overt bleeding. Could be dilutional. Recheck tomorrow.   DVT Prophylaxis: Subcutaneous  heparin    Code Status: Full code  Family Communication: Discussed with the patient  Disposition Plan: PT and OT evaluation. Continue treatment as outlined above.    LOS: 3 days   [facility] Pager [phone] 03/13/2017, 12:21 PM  If 7PM-7AM, please contact night-coverage at [email or web address], password [facility]

## 2017-03-13 NOTE — Progress Notes (Addendum)
Pharmacy Antibiotic Note  Olivia Randall is a 66 y.o. female admitted on 03/10/2017 from [place] has been consulted for Cefepime dosing for pneumonia.  Sputum culture from [facility] noted to have grown Pseudomonas.     Day # 4 Cefepime at [facility].  May have received a dose at [facility] prior to transfer.    Noted planning 8 days of antibiotics.  Solumedrol changing to Prednisone.  Plan:  Continue Cefepime 2gm IV q12hrs.  Follow up renal function, progress.  Noted plan for 3 more days of antibiotics.  Height: 5\' 3"  (160 cm) Weight: 99 lb 3.3 oz (45 kg) IBW/kg (Calculated) : 52.4  Temp (24hrs), Avg:98.2 F (36.8 C), Min:97.8 F (36.6 C), Max:98.6 F (37 C)   Recent Labs Lab 03/10/17 1520 03/11/17 0356 03/13/17 0621  WBC 14.3* 11.1*  --   CREATININE 0.50 0.51 0.53  LATICACIDVEN 0.5  --   --     Estimated Creatinine Clearance: 48.5 mL/min (by C-G formula based on SCr of 0.53 mg/dL).    Allergies  Allergen Reactions  . Clopidogrel Other (See Comments)    Confusion and behavioral changes (altered)    Antimicrobials this admission:  Cefepime 6/28>>  Dose adjustments this admission:  na/  Microbiology results:  6/28 MRSA PCR negative No other cultures at [facility]  *At [facility] - Pseudomonas in sputum cx   Blood cultures noted negative x 48 hrs per [facility] transfer note    Thank you for allowing pharmacy to be a part of this patient's care.  Arty Baumgartner, [place] Pager: [phone] 03/13/2017 2:51 PM

## 2017-03-13 NOTE — Evaluation (Signed)
Physical Therapy Evaluation Patient Details Name: Olivia Randall MRN: [id number] DOB: 1951/07/02 Today's Date: 03/13/2017   History of Present Illness  Pt is a 66 yo female admitted via transfer from [place] on 03/10/17 where she had been intubated on 6/24 with attempted extubation 6/28 with no success. Pt was transferred to [facility] and finally extubated successfully on 03/11/17. Pt was in acute respiratory failure due to COPD. PMH significant for anxiety, depression, HTN, CBP, COPD, dementia, GERD, MS  Clinical Impression  Pt presents with the above diagnosis and below deficits for therapy evaluation. Prior to admission, pt was completely independent and living with her family. Pt was able to get around without an AD. Currently, pt requires Min A for all mobility with significant deconditioning noted with short distance gait x 20'. Pt will benefit from SNF at discharge for short term rehab in order to improve strength and endurance prior to returning home. Pt continues to benefit from acute PT follow-up to address the below deficits prior to discharge.     Follow Up Recommendations SNF;Supervision for mobility/OOB    Equipment Recommendations  Rolling walker with 5" wheels;3in1 (PT)    Recommendations for Other Services       Precautions / Restrictions Precautions Precautions: Fall Precaution Comments: Watch O2 sats Restrictions Weight Bearing Restrictions: No      Mobility  Bed Mobility Overal bed mobility: Needs Assistance Bed Mobility: Supine to Sit;Sit to Supine     Supine to sit: Min guard Sit to supine: Min guard   General bed mobility comments: Able to get EOB and back in bed with no hands on assistance, but requires min guard for safety  Transfers Overall transfer level: Needs assistance Equipment used: Rolling walker (2 wheeled) Transfers: Sit to/from Stand Sit to Stand: Min assist         General transfer comment: MIn A from EOB to RW. Cues for hand  placement  Ambulation/Gait Ambulation/Gait assistance: Min assist Ambulation Distance (Feet): 20 Feet Assistive device: Rolling walker (2 wheeled) Gait Pattern/deviations: Step-through pattern;Decreased step length - right;Decreased step length - left Gait velocity: decreased Gait velocity interpretation: Below normal speed for age/gender General Gait Details: slow cadence and minimal shuffeling noted. Cues for proximity and positioning within RW throughout gait  Stairs            Wheelchair Mobility    Modified Rankin (Stroke Patients Only)       Balance Overall balance assessment: Needs assistance Sitting-balance support: No upper extremity supported;Feet supported Sitting balance-Leahy Scale: Fair     Standing balance support: Bilateral upper extremity supported;During functional activity Standing balance-Leahy Scale: Poor Standing balance comment: reliant on UE support for stability in standing due to fatigue                             Pertinent Vitals/Pain Pain Assessment: No/denies pain    Home Living Family/patient expects to be discharged to:: Skilled nursing facility Living Arrangements: Other relatives               Additional Comments: was independent prior to admission    Prior Function Level of Independence: Independent         Comments: pt reports sitting to shower, able to perform self care and ambulate independently     Hand Dominance   Dominant Hand: Right    Extremity/Trunk Assessment   Upper Extremity Assessment Upper Extremity Assessment: Defer to OT evaluation  Lower Extremity Assessment Lower Extremity Assessment: Generalized weakness    Cervical / Trunk Assessment Cervical / Trunk Assessment: Normal  Communication   Communication: HOH  Cognition Arousal/Alertness: Awake/alert Behavior During Therapy: WFL for tasks assessed/performed Overall Cognitive Status: Within Functional Limits for tasks  assessed                                 General Comments: reports some memory loss due to intubation. Unable to recall how she came to [facility]      General Comments General comments (skin integrity, edema, etc.): O2 sats remain in the 90s throughout gait with 2L O2 in place via [place]    Exercises     Assessment/Plan    PT Assessment Patient needs continued PT services  PT Problem List Decreased strength;Decreased activity tolerance;Decreased balance;Decreased mobility;Decreased knowledge of use of DME       PT Treatment Interventions DME instruction;Gait training;Functional mobility training;Therapeutic activities;Therapeutic exercise;Balance training    PT Goals (Current goals can be found in the Care Plan section)  Acute Rehab PT Goals Patient Stated Goal: to feel stronger PT Goal Formulation: With patient Time For Goal Achievement: 03/27/17 Potential to Achieve Goals: Good    Frequency Min 2X/week   Barriers to discharge        Co-evaluation               AM-PAC PT "6 Clicks" Daily Activity  Outcome Measure Difficulty turning over in bed (including adjusting bedclothes, sheets and blankets)?: None Difficulty moving from lying on back to sitting on the side of the bed? : Total Difficulty sitting down on and standing up from a chair with arms (e.g., wheelchair, bedside commode, etc,.)?: Total Help needed moving to and from a bed to chair (including a wheelchair)?: A Lot Help needed walking in hospital room?: A Lot Help needed climbing 3-5 steps with a railing? : Total 6 Click Score: 11    End of Session Equipment Utilized During Treatment: Gait belt;Oxygen Activity Tolerance: Patient limited by fatigue Patient left: in bed;with bed alarm set;with nursing/sitter in room;with call bell/phone within reach Nurse Communication: Mobility status PT Visit Diagnosis: Unsteadiness on feet (R26.81);Muscle weakness (generalized) (M62.81);Difficulty in walking,  not elsewhere classified (R26.2)    Time: 5003-7048 PT Time Calculation (min) (ACUTE ONLY): 25 min   Charges:   PT Evaluation $PT Eval Moderate Complexity: 1 Procedure PT Treatments $Gait Training: 8-22 mins   PT G Codes:        Scheryl Marten PT, DPT  [phone]   Shanon Rosser 03/13/2017, 12:37 PM

## 2017-03-14 ENCOUNTER — Encounter (HOSPITAL_COMMUNITY): Payer: Self-pay

## 2017-03-14 DIAGNOSIS — D649 Anemia, unspecified: Secondary | ICD-10-CM

## 2017-03-14 LAB — GLUCOSE, CAPILLARY
GLUCOSE-CAPILLARY: 93 mg/dL (ref 65–99)
Glucose-Capillary: 91 mg/dL (ref 65–99)
Glucose-Capillary: 90 mg/dL (ref 65–99)
Glucose-Capillary: 124 mg/dL — ABNORMAL HIGH (ref 65–99)

## 2017-03-14 LAB — BASIC METABOLIC PANEL
ANION GAP: 3 — AB (ref 5–15)
BUN: 8 mg/dL (ref 6–20)
CALCIUM: 9.7 mg/dL (ref 8.9–10.3)
CO2: 40 mmol/L — ABNORMAL HIGH (ref 22–32)
CREATININE: 0.42 mg/dL — AB (ref 0.44–1.00)
Chloride: 97 mmol/L — ABNORMAL LOW (ref 101–111)
GFR calc Af Amer: >60 mL/min (ref >60)
GLUCOSE: 97 mg/dL (ref 65–99)
Potassium: 4.3 mmol/L (ref 3.5–5.1)
Sodium: 140 mmol/L (ref 135–145)

## 2017-03-14 LAB — CBC
HCT: 30.8 % — ABNORMAL LOW (ref 36.0–46.0)
Hemoglobin: 8.9 g/dL — ABNORMAL LOW (ref 12.0–15.0)
MCH: 25.5 pg — ABNORMAL LOW (ref 26.0–34.0)
MCHC: 28.9 g/dL — AB (ref 30.0–36.0)
MCV: 88.3 fL (ref 78.0–100.0)
PLATELETS: 276 10*3/uL (ref 150–400)
RBC: 3.49 MIL/uL — ABNORMAL LOW (ref 3.87–5.11)
RDW: 15.5 % (ref 11.5–15.5)
WBC: 6.1 10*3/uL (ref 4.0–10.5)

## 2017-03-14 MED ORDER — IPRATROPIUM-ALBUTEROL 0.5-2.5 (3) MG/3ML IN SOLN
3.0000 mL | RESPIRATORY_TRACT | Status: DC | PRN
  Administered 2017-03-14: 3 mL via RESPIRATORY_TRACT
  Filled 2017-03-14: qty 3

## 2017-03-14 NOTE — Clinical Social Work Note (Signed)
Clinical Social Work Assessment  Patient Details  Name: Olivia Randall MRN: [id number] Date of Birth: [birth year]  Date of referral:  03/14/17               Reason for consult:  Facility Placement                Permission sought to share information with:  Facility Sport and exercise psychologist, Family Supports Permission granted to share information::  No (pt disoriented spoke with next of Kin and POA)  Name::     Ship broker::  SNF  Relationship::  Licensed conveyancer Information:     Housing/Transportation Living arrangements for the past 2 months:  Single Family Home Source of Information:  Adult Children, Other (Comment Required) (granddaughter) Patient Interpreter Needed:  None Criminal Activity/Legal Involvement Pertinent to Current Situation/Hospitalization:  No - Comment as needed Significant Relationships:  Adult Children, Other Family Members Lives with:  Other (Comment) (granddaughter) Do you feel safe going back to the place where you live?    Need for family participation in patient care:  Yes (Comment) (caregiving at home, cooking, etc)  Care giving concerns:  Pt lives with granddaughter who assist with caregiving at home.  Patient with increased weakness and family believes pt would benefit from rehab stay to increase mobility.   Social Worker assessment / plan:  CSW spoke with pt dtr concerning PT recommendation for SNF. CSW explained SNF and SNF referral process.  Pt dtr reports that patient is normally capalbe of being independent with mobility though not motivated to be.  Employment status:  Retired Nurse, adult PT Recommendations:  [place] / Referral to community resources:  [place]  Patient/Family's Response to care: Dtr agreeable to SNF placement as long as pt granddaughter and pt are in agreement.  Hopeful that rehab will help increase patient mobility and motivation to move  at home.  Patient/Family's Understanding of and Emotional Response to Diagnosis, Current Treatment, and Prognosis:  No questions or concerns at this time- hopeful that rehab would be very short.  Emotional Assessment Appearance:  Appears stated age Attitude/Demeanor/Rapport:    Affect (typically observed):  Appropriate Orientation:  Oriented to Self Alcohol / Substance use:  Not Applicable Psych involvement (Current and /or in the community):  No (Comment)  Discharge Needs  Concerns to be addressed:  Care Coordination Readmission within the last 30 days:  No Current discharge risk:  Physical Impairment Barriers to Discharge:  Continued Medical Work up   Jorge Ny, LCSW 03/14/2017, 9:54 AM

## 2017-03-14 NOTE — Evaluation (Signed)
Occupational Therapy Evaluation Patient Details Name: Olivia Randall MRN: [id number] DOB: 04/19/1951 Today's Date: 03/14/2017    History of Present Illness Pt is a 66 yo female admitted via transfer from [place] on 03/10/17 where she had been intubated on 6/24 with attempted extubation 6/28 with no success. Pt was transferred to [facility] and finally extubated successfully on 03/11/17. Pt was in acute respiratory failure due to COPD. PMH significant for anxiety, depression, HTN, CBP, COPD, dementia, GERD, MS   Clinical Impression   PTA, pt reports independence with basic ADL and functional mobility. She currently requires overall min assist for standing ADL tasks and min guard for seated UB ADL. She presents with significantly diminished activity tolerance for ADL and requires frequent seated therapeutic rest breaks while participating in ADL tasks. Pt demonstrates significant functional decline from PLOF and would benefit from continued OT services at SNF level in order to maximize independence and safety prior to return home. OT will continue to follow while admitted.      Follow Up Recommendations  SNF;Supervision/Assistance - 24 hour    Equipment Recommendations  Other (comment) (TBD at next venue of care)    Recommendations for Other Services       Precautions / Restrictions Precautions Precautions: Fall Precaution Comments: Watch O2 sats Restrictions Weight Bearing Restrictions: No      Mobility Bed Mobility Overal bed mobility: Needs Assistance Bed Mobility: Supine to Sit;Sit to Supine     Supine to sit: Min guard Sit to supine: Min guard   General bed mobility comments: Min guard assist for safety.   Transfers Overall transfer level: Needs assistance Equipment used: Rolling walker (2 wheeled) Transfers: Sit to/from Stand Sit to Stand: Min assist         General transfer comment: Min assist for steadying.     Balance Overall balance assessment: Needs  assistance Sitting-balance support: No upper extremity supported;Feet supported Sitting balance-Leahy Scale: Fair     Standing balance support: Bilateral upper extremity supported;During functional activity Standing balance-Leahy Scale: Poor Standing balance comment: Relies on UE support in standing position.                            ADL either performed or assessed with clinical judgement   ADL Overall ADL's : Needs assistance/impaired Eating/Feeding: Set up;Bed level   Grooming: Supervision/safety;Set up;Sitting   Upper Body Bathing: Min guard;Sitting   Lower Body Bathing: Minimal assistance;Sit to/from stand   Upper Body Dressing : Min guard;Sitting   Lower Body Dressing: Minimal assistance;Sit to/from stand   Toilet Transfer: BSC;RW;Ambulation;Minimal assistance   Toileting- Water quality scientist and Hygiene: Sit to/from stand;Minimal assistance       Functional mobility during ADLs: Rolling walker;Minimal assistance General ADL Comments: Min assist to ambulate to sink for grooming tasks. Pt with significantly decreased activity tolerance requiring seated rest breaks to complete grooming tasks.      Vision Patient Visual Report: No change from baseline Vision Assessment?: No apparent visual deficits     Perception     Praxis      Pertinent Vitals/Pain Pain Assessment: Faces Faces Pain Scale: Hurts little more Pain Location: Generalized Pain Intervention(s): Monitored during session     Hand Dominance Right   Extremity/Trunk Assessment Upper Extremity Assessment Upper Extremity Assessment: Generalized weakness   Lower Extremity Assessment Lower Extremity Assessment: Generalized weakness   Cervical / Trunk Assessment Cervical / Trunk Assessment: Normal   Communication Communication Communication: [facility]  Cognition Arousal/Alertness: Awake/alert Behavior During Therapy: WFL for tasks assessed/performed Overall Cognitive Status: Within  Functional Limits for tasks assessed                                     General Comments       Exercises     Shoulder Instructions      Home Living Family/patient expects to be discharged to:: Skilled nursing facility Living Arrangements: Other relatives                               Additional Comments: was independent prior to admission      Prior Functioning/Environment Level of Independence: Independent        Comments: pt reports sitting to shower, able to perform self care and ambulate independently        OT Problem List: Decreased strength;Decreased activity tolerance;Impaired balance (sitting and/or standing);Decreased cognition;Decreased safety awareness;Decreased knowledge of use of DME or AE;Cardiopulmonary status limiting activity      OT Treatment/Interventions: Self-care/ADL training;Therapeutic exercise;Energy conservation;DME and/or AE instruction;Therapeutic activities;Patient/family education;Balance training    OT Goals(Current goals can be found in the care plan section) Acute Rehab OT Goals Patient Stated Goal: to feel stronger OT Goal Formulation: With patient Time For Goal Achievement: 03/28/17 Potential to Achieve Goals: Good ADL Goals Pt Will Perform Grooming: with modified independence;standing Pt Will Perform Upper Body Dressing: with modified independence;sitting Pt Will Perform Lower Body Dressing: with modified independence;sit to/from stand Pt Will Transfer to Toilet: with modified independence;ambulating;bedside commode (BSC over toilet) Pt Will Perform Toileting - Clothing Manipulation and hygiene: with modified independence;sit to/from stand Pt Will Perform Tub/Shower Transfer: with modified independence;Tub transfer;shower seat;rolling walker;ambulating  OT Frequency: Min 2X/week   Barriers to D/C:            Co-evaluation              AM-PAC PT "6 Clicks" Daily Activity     Outcome Measure  Help from another person eating meals?: A Little Help from another person taking care of personal grooming?: A Little Help from another person toileting, which includes using toliet, bedpan, or urinal?: A Little Help from another person bathing (including washing, rinsing, drying)?: A Little Help from another person to put on and taking off regular upper body clothing?: A Little Help from another person to put on and taking off regular lower body clothing?: A Little 6 Click Score: 18   End of Session Equipment Utilized During Treatment: Gait belt;Rolling walker;Oxygen Nurse Communication: Mobility status  Activity Tolerance: Patient tolerated treatment well Patient left: in bed;with call bell/phone within reach;with bed alarm set  OT Visit Diagnosis: Unsteadiness on feet (R26.81);Muscle weakness (generalized) (M62.81)                Time: 9767-3419 OT Time Calculation (min): 24 min Charges:  OT General Charges $OT Visit: 1 Procedure OT Evaluation $OT Eval Moderate Complexity: 1 Procedure OT Treatments $Self Care/Home Management : 8-22 mins G-Codes:     Norman Herrlich, MS OTR/L  Pager: [place] A Joss Friedel 03/14/2017, 4:59 PM

## 2017-03-14 NOTE — NC FL2 (Signed)
[place] LEVEL OF CARE SCREENING TOOL     IDENTIFICATION  Patient Name: Olivia Randall Birthdate: May 12, 1951 Sex: female Admission Date (Current Location): 03/10/2017  [facility] and [place] Number:  Herbalist and Address:  [facility] [place], [place] [address] [place], [place] [address]      Provider Number: [id number]  Attending Physician Name and Address:  Bonnielee Haff, MD  Relative Name and Phone Number:  [place], [phone]    Current Level of Care: Hospital Recommended Level of Care: [place] Prior Approval Number:    Date Approved/Denied:   PASRR Number: [id number]  Discharge Plan: SNF    Current Diagnoses: Patient Active Problem List   Diagnosis Date Noted  . Malnutrition of moderate degree 03/12/2017  . Encounter for orogastric (OG) tube placement   . Encounter for intubation   . COPD exacerbation ([place]) 03/10/2017  . Pressure injury of skin 03/10/2017  . Acute respiratory failure ([place]) 01/23/2017  . Altered mental status 01/23/2017  . Normocytic anemia 01/23/2017  . Acute respiratory failure with hypercapnia ([place]) 09/05/2016  . Essential hypertension 11/08/2015  . Mucosal abnormality of stomach   . Hyponatremia 03/18/2015  . Sleep apnea   . Anxiety and depression   . Dysphagia, pharyngoesophageal phase 02/18/2015  . OSA (obstructive sleep apnea) 05/20/2013  . Tobacco use disorder 05/20/2013  . Dizzy 12/04/2012  . COPD with acute exacerbation ([place]) 12/03/2012  . Chronic respiratory failure ([place]) 12/03/2012  . Hypokalemia 12/03/2012  . Chronic back pain 12/03/2012  . GERD (gastroesophageal reflux disease) 12/03/2012    Orientation RESPIRATION BLADDER Height & Weight     Self  O2 (Nasal cannula 1L) Continent Weight: 98 lb 5.2 oz (44.6 kg) Height:  5\' 3"  (160 cm)  BEHAVIORAL SYMPTOMS/MOOD NEUROLOGICAL BOWEL NUTRITION STATUS      Continent Diet (Please see DC Summary)  AMBULATORY  STATUS COMMUNICATION OF NEEDS Skin   Limited Assist Verbally PU Stage and Appropriate Care (Pressure injury stage I on sacrum)                       Personal Care Assistance Level of Assistance  Bathing, Feeding, Dressing Bathing Assistance: Limited assistance Feeding assistance: Independent Dressing Assistance: Limited assistance     Functional Limitations Info             SPECIAL CARE FACTORS FREQUENCY  PT (By licensed PT)     PT Frequency: 5x/week              Contractures      Additional Factors Info  Code Status, Allergies Code Status Info: Full Allergies Info: Clopidogrel           Current Medications (03/14/2017):  This is the current hospital active medication list Current Facility-Administered Medications  Medication Dose Route Frequency Provider Last Rate Last Dose  . 0.9 %  sodium chloride infusion   Intravenous Continuous Raylene Miyamoto, MD 10 mL/hr at 03/12/17 [phone]    . acetaminophen (TYLENOL) tablet 650 mg  650 mg Oral Q6H PRN Mauri Brooklyn, MD   650 mg at 03/13/17 1450  . arformoterol (BROVANA) nebulizer solution 15 mcg  15 mcg Nebulization BID Shearon Stalls, Rahul P, PA-C   15 mcg at 03/14/17 0748  . budesonide (PULMICORT) nebulizer solution 0.5 mg  0.5 mg Nebulization BID Shearon Stalls, Rahul P, PA-C   0.5 mg at 03/14/17 0751  . ceFEPIme (MAXIPIME) 1 g in dextrose 5 % 50 mL  IVPB  1 g Intravenous Q12H Rumbarger, Valeda Malm, RPH   Stopped at 03/14/17 0418  . feeding supplement (ENSURE ENLIVE) (ENSURE ENLIVE) liquid 237 mL  237 mL Oral BID BM Mannam, Praveen, MD   237 mL at 03/13/17 1515  . furosemide (LASIX) tablet 40 mg  40 mg Oral Daily Bonnielee Haff, MD      . heparin injection 5,000 Units  5,000 Units Subcutaneous Q8H Desai, Rahul P, PA-C   5,000 Units at 03/13/17 2200  . ipratropium-albuterol (DUONEB) 0.5-2.5 (3) MG/3ML nebulizer solution 3 mL  3 mL Nebulization TID Mannam, Praveen, MD   3 mL at 03/14/17 0743  . pantoprazole (PROTONIX) EC tablet 40 mg  40  mg Oral QHS Bonnielee Haff, MD   40 mg at 03/13/17 2200  . predniSONE (DELTASONE) tablet 40 mg  40 mg Oral Q breakfast Bonnielee Haff, MD         Discharge Medications: Please see discharge summary for a list of discharge medications.  Relevant Imaging Results:  Relevant Lab Results:   Additional Information SSN: [id number]  Jorge Ny, [place]

## 2017-03-14 NOTE — Care Management Important Message (Signed)
Important Message  Patient Details  Name: Olivia Randall MRN: [id number] Date of Birth: 1951-09-03   Medicare Important Message Given:  Yes    Nathen May 03/14/2017, 10:27 AM

## 2017-03-14 NOTE — Progress Notes (Signed)
[facility] PROGRESS NOTE  Olivia Randall YYQ:[id number] DOB: 1950/11/26 DOA: 03/10/2017  PCP: Octavio Graves, DO  Brief History/Interval Summary: 66 year old Caucasian female with a past medical history of COPD, chronic respiratory failure, oxygen dependent, history of dementia, depression, GERD, hypertension, multiple sclerosis was admitted to [facility] on June 24, for altered mental status. She was found to have acute on chronic hypercapnic respiratory failure. She had to be intubated. She was treated in the ICU. Sputum cultures were apparently positive for Pseudomonas, which was treated with cefepime. She failed extubation over at the other facility and so was transferred to [facility] for higher level of care. She was admitted to the intensive care unit here. She was extubated and then subsequently transferred to the floor.  Reason for Visit: Acute respiratory failure  Consultants: Pulmonology  Procedures: None  Antibiotics: Cefepime  Subjective/Interval History: Complains of feeling more short of breath this morning than before. Does not remember when she had her last breathing treatment. Denies any chest pain. Overall feels better.   ROS: Denies headaches.  Objective:  Vital Signs  Vitals:   03/14/17 0458 03/14/17 0743 03/14/17 0748 03/14/17 0752  BP: (!) 123/59     Pulse: 79     Resp: 16     Temp: 99.1 F (37.3 C)     TempSrc:      SpO2: 100% 99% 99% 99%  Weight:      Height:        Intake/Output Summary (Last 24 hours) at 03/14/17 0856 Last data filed at 03/14/17 0634  Gross per 24 hour  Intake           469.83 ml  Output             1550 ml  Net         -1080.17 ml   Filed Weights   03/13/17 0500 03/14/17 0017 03/14/17 0354  Weight: 45 kg (99 lb 3.3 oz) 44.9 kg (98 lb 15.8 oz) 44.6 kg (98 lb 5.2 oz)    General appearance: Awake, alert. No distress Resp: Coarse breath sounds bilaterally. Few scattered wheezes. Few crackles at bases.  Wheezes noted. Good air entry bilaterally. Cardio: S1, S2 is normal, regular. No S3, S4. No murmurs or bruit GI: Abdomen is soft. Nontender, nondistended. Bowel sounds are present. No masses or organomegaly Extremities: No edema Neurologic: No focal neurological deficits.  Lab Results:  Data Reviewed: I have personally reviewed following labs and imaging studies  CBC:  Recent Labs Lab 03/10/17 1520 03/11/17 0356 03/14/17 0506  WBC 14.3* 11.1* 6.1  NEUTROABS 13.2*  --   --   HGB 9.6* 8.7* 8.9*  HCT 32.9* 29.7* 30.8*  MCV 88.4 86.6 88.3  PLT 142* 145* 888    Basic Metabolic Panel:  Recent Labs Lab 03/10/17 1520 03/11/17 0356 03/13/17 0621 03/14/17 0506  NA 135 137 139 140  K 3.7 3.7 3.0* 4.3  CL 99* 100* 97* 97*  CO2 32 32 35* 40*  GLUCOSE 115* 108* 104* 97  BUN 21* 20 11 8   CREATININE 0.50 0.51 0.53 0.42*  CALCIUM 10.0 9.5 9.6 9.7  MG 1.6* 1.9 1.7  --   PHOS 2.9 2.1* 2.6  --     GFR: Estimated Creatinine Clearance: 48 mL/min (A) (by C-G formula based on SCr of 0.42 mg/dL (L)).  Liver Function Tests:  Recent Labs Lab 03/10/17 1520  AST 32  ALT 47  ALKPHOS 101  BILITOT 0.5  PROT 4.9*  ALBUMIN 2.3*    Cardiac Enzymes:  Recent Labs Lab 03/10/17 1520  TROPONINI 0.08*    CBG:  Recent Labs Lab 03/13/17 1801 03/13/17 2012 03/14/17 0101 03/14/17 0456 03/14/17 0812  GLUCAP 148* 127* 91 90 93     Recent Results (from the past 240 hour(s))  MRSA PCR Screening     Status: None   Collection Time: 03/10/17  1:39 PM  Result Value Ref Range Status   MRSA by PCR NEGATIVE NEGATIVE Final    Comment:        The GeneXpert MRSA Assay (FDA approved for NASAL specimens only), is one component of a comprehensive MRSA colonization surveillance program. It is not intended to diagnose MRSA infection nor to guide or monitor treatment for MRSA infections.       Radiology Studies: Dg Chest Port 1 View  Result Date: 03/13/2017 CLINICAL DATA:   Ventilator support.  Endotracheal removal. EXAM: PORTABLE CHEST 1 VIEW COMPARISON:  03/11/2017 FINDINGS: Endotracheal tube and nasogastric tube have been removed. Background pattern of chronic pulmonary scarring. Chronic volume loss at the right base. Probable small right effusion. Left perihilar mass lesion visible on the previous CT not specifically visible by radiography. IMPRESSION: Chronic interstitial markings. Chronic volume loss at the right base. Possible small amount of pleural fluid on the right. Perihilar left upper lobe mass lesion shown at previous CT not visible by radiography. Electronically Signed   By: Nelson Chimes M.D.   On: 03/13/2017 10:32     Medications:  Scheduled: . arformoterol  15 mcg Nebulization BID  . budesonide (PULMICORT) nebulizer solution  0.5 mg Nebulization BID  . feeding supplement (ENSURE ENLIVE)  237 mL Oral BID BM  . furosemide  40 mg Oral Daily  . heparin  5,000 Units Subcutaneous Q8H  . ipratropium-albuterol  3 mL Nebulization TID  . pantoprazole  40 mg Oral QHS  . predniSONE  40 mg Oral Q breakfast   Continuous: . sodium chloride 10 mL/hr at 03/12/17 0931  . ceFEPime (MAXIPIME) IV Stopped (03/14/17 0418)   HUT:[id number]  Assessment/Plan:  Active Problems:   COPD exacerbation (HCC)   Pressure injury of skin   Encounter for orogastric (OG) tube placement   Encounter for intubation   Malnutrition of moderate degree    Acute on chronic hypercapnic and hypoxemic respiratory failure. She was intubated at [facility] on 6/24, extubated 6/28, however, had to be reintubated and then transferred here. She was in our intensive care unit. Stabilized and extubated. Seems to be doing well. Continue oxygen. Continue oral Lasix. Given nebulized treatments for her dyspnea today.  Acute COPD exacerbation. Since to be improving. Her shortness of breath this morning could be her usual fluctuations in symptoms. She does have severe COPD. She is oxygen  dependent. Continue to taper steroids. Continue nebulizer treatments.  Pseudomonas in sputum. This was identified at the outside facility. Patient was on cefepime. No infiltrates noted on chest x-ray. This could have been purulent bronchitis. Discussed with Dr. Titus Mould. He recommends about 8 days of antibiotics. Patient has completed 5 days. So we will give it for 3 more days starting 7/2.  Acute encephalopathy. This has resolved. Now back to baseline.  History of multiple sclerosis. Stable.  History of depression and anxiety. Stable.  History of chronic back pain. Patient is on OxyIR at home. Seems to be stable currently.  Normocytic anemia. Hemoglobin noted to be slightly lower than her usual baseline. No overt bleeding. Could be dilutional. Hemoglobin stable today.  DVT Prophylaxis: Subcutaneous heparin    Code Status: Full code  Family Communication: Discussed with the patient  Disposition Plan: Skilled nursing facility has been recommended for short-term rehabilitation. Patient agreeable. Social worker notified. Anticipate discharge tomorrow.    LOS: 4 days   [facility] Pager [phone] 03/14/2017, 8:56 AM  If 7PM-7AM, please contact night-coverage at [email or web address], password [facility]

## 2017-03-14 NOTE — Progress Notes (Signed)
  Speech Language Pathology Treatment: Dysphagia  Patient Details Name: Olivia Randall MRN: [id number] DOB: 21-Aug-1951 Today's Date: 03/14/2017 Time: 0092-3300 SLP Time Calculation (min) (ACUTE ONLY): 9 min  Assessment / Plan / Recommendation Clinical Impression  Pt eating breakfast; stated she is having increased shortness of breath. No overt s/s penetration or aspiration although increased respiratory rate and history of COPD increases risk. CXR yesterday revealed chronic interstitial markings. Chronic volume loss at the right base. Possible small amount of pleural fluid on the right. Perihilar left upper lobe mass lesion shown at previous CT not visible by radiography. SLP educated and reiterated importance of upright position, slow rate and rest breaks. Pt is HOH and SLP will continue to treat to ensure she hears and processes all info related to swallow precautions and determine if modifications are needed.    HPI HPI: Pt is a 66 y.o. female with PMH Anxiety and depression; Asthma; Chronic back pain; Chronic neck pain; COPD (chronic obstructive pulmonary disease) ([place]); Dementia; Depression; GERD (gastroesophageal reflux disease); Hypertension; MS (multiple sclerosis) ([place]); MS (multiple sclerosis) ([place]); On home O2; Shortness of breath dyspnea; and Sleep apnea. Pt was admitted to [facility] on 6/24 for AMS. She was found to have acute on chronic hypercapnic respiratory failure and required intubation. Was extubated 6/28 but then declined and was reintubated and transferred to [facility]; extubated 6/29.Apparently one of the family members had mentioned that she might have taken excessive medications the night prior and it was unclear if this was suicidal attempt or not. Apparently patient lost her husband 2 months prior and ever since then she had basically lost her will to live.She lives with her grandchildren and family members had also expressed concern as to whether or not her grandchildren  with taking her medications and selling them as well as not taking care of patient appropriately. Of note pt followed by speech therapy in 2017 following extubation; had FEES with recommendation of regular/ thin liquids. Bedside swallow eval ordered.       SLP Plan  Continue with current plan of care       Recommendations  Diet recommendations: Regular;Thin liquid Liquids provided via: Cup;Straw Medication Administration: Whole meds with liquid Supervision: Patient able to self feed;Intermittent supervision to cue for compensatory strategies Compensations: Slow rate;Small sips/bites;Follow solids with liquid Postural Changes and/or Swallow Maneuvers: Seated upright 90 degrees;Upright 30-60 min after meal                Oral Care Recommendations: Oral care BID Follow up Recommendations:  (TBD) SLP Visit Diagnosis: Dysphagia, unspecified (R13.10) Plan: Continue with current plan of care       GO                [name] 03/14/2017, 9:20 AM   [name].Ed [employer] [phone]

## 2017-03-15 LAB — POCT I-STAT 3, ART BLOOD GAS (G3+)
ACID-BASE EXCESS: 6 mmol/L — AB (ref 0.0–2.0)
BICARBONATE: 35 mmol/L — AB (ref 20.0–28.0)
O2 Saturation: 99 %
PH ART: 7.296 — AB (ref 7.350–7.450)
TCO2: 37 mmol/L (ref 0–100)
pCO2 arterial: 71.8 mmHg (ref 32.0–48.0)
pO2, Arterial: 155 mmHg — ABNORMAL HIGH (ref 83.0–108.0)

## 2017-03-15 MED ORDER — IPRATROPIUM-ALBUTEROL 0.5-2.5 (3) MG/3ML IN SOLN: 3.0000 mL | Freq: Three times a day (TID) | RESPIRATORY_TRACT | Status: DC

## 2017-03-15 MED ORDER — DEXTROSE 5 % IV SOLN
1.0000 g | Freq: Once | INTRAVENOUS | Status: AC
  Administered 2017-03-15: 1 g via INTRAVENOUS
  Filled 2017-03-15: qty 1

## 2017-03-15 MED ORDER — DEXTROSE 5 % IV SOLN: 1.0000 g | Freq: Two times a day (BID) | INTRAVENOUS | Status: DC

## 2017-03-15 MED ORDER — OXYCODONE HCL 5 MG PO TABS: 5.0000 mg | ORAL_TABLET | Freq: Four times a day (QID) | ORAL | 0 refills | Status: DC | PRN

## 2017-03-15 MED ORDER — FUROSEMIDE 40 MG PO TABS: 40.0000 mg | ORAL_TABLET | Freq: Every day | ORAL | Status: DC

## 2017-03-15 MED ORDER — CEFEPIME HCL 1 G IJ SOLR: 1.0000 g | Freq: Two times a day (BID) | INTRAMUSCULAR | Status: DC

## 2017-03-15 MED ORDER — DEXTROSE 5 % IV SOLN
1.0000 g | Freq: Two times a day (BID) | INTRAVENOUS | Status: DC
  Filled 2017-03-15 (×2): qty 1

## 2017-03-15 MED ORDER — FLUTICASONE FUROATE-VILANTEROL 200-25 MCG/INH IN AEPB: 1.0000 | INHALATION_SPRAY | Freq: Every day | RESPIRATORY_TRACT | Status: DC

## 2017-03-15 MED ORDER — PREDNISONE 10 MG PO TABS: ORAL_TABLET | ORAL | 0 refills | Status: DC

## 2017-03-15 MED ORDER — DEXTROSE 5 % IV SOLN: 1.0000 g | Freq: Two times a day (BID) | INTRAVENOUS | Status: AC

## 2017-03-15 MED ORDER — IPRATROPIUM-ALBUTEROL 0.5-2.5 (3) MG/3ML IN SOLN: 3.0000 mL | RESPIRATORY_TRACT | Status: DC | PRN

## 2017-03-15 NOTE — Progress Notes (Signed)
Physical Therapy Treatment Patient Details Name: Olivia Randall MRN: [id number] DOB: [birth year] Today's Date: 03/15/2017    History of Present Illness Pt is a 66 yo female admitted via transfer from [place] on 03/10/17 where she had been intubated on 6/24 with attempted extubation 6/28 with no success. Pt was transferred to [facility] and finally extubated successfully on 03/11/17. Pt was in acute respiratory failure due to COPD. PMH significant for anxiety, depression, HTN, CBP, COPD, dementia, GERD, MS    PT Comments    Patient tolerated short distance gait and limited by fatigue. 2/4 DOE with ambulation. SpO2 >90% on 2L O2 via [place]. Current plan remains appropriate.    Follow Up Recommendations  SNF;Supervision for mobility/OOB     Equipment Recommendations  Rolling walker with 5" wheels;3in1 (PT)    Recommendations for Other Services       Precautions / Restrictions Precautions Precautions: Fall Precaution Comments: Watch O2 sats Restrictions Weight Bearing Restrictions: No    Mobility  Bed Mobility Overal bed mobility: Modified Independent Bed Mobility: Supine to Sit           General bed mobility comments: increased effort; use of bed rail  Transfers Overall transfer level: Needs assistance Equipment used: Rolling walker (2 wheeled);1 person hand held assist Transfers: Sit to/from [facility] Sit to Stand: Min guard Stand pivot transfers: Min assist       General transfer comment: min guard to stand from EOB and BSC; HHA for stand pivot to [facility]; cues for safety   Ambulation/Gait Ambulation/Gait assistance: Min assist Ambulation Distance (Feet): 20 Feet Assistive device: Rolling walker (2 wheeled) Gait Pattern/deviations: Step-through pattern;Decreased step length - right;Decreased step length - left;Trunk flexed Gait velocity: decreased   General Gait Details: decreased bilat step height; cues for posture and proximity of RW   Stairs             Wheelchair Mobility    Modified Rankin (Stroke Patients Only)       Balance Overall balance assessment: Needs assistance Sitting-balance support: No upper extremity supported;Feet supported Sitting balance-Leahy Scale: Fair     Standing balance support: Single extremity supported Standing balance-Leahy Scale: Poor                              Cognition Arousal/Alertness: Awake/alert Behavior During Therapy: WFL for tasks assessed/performed Overall Cognitive Status: Within Functional Limits for tasks assessed                                        Exercises      General Comments General comments (skin integrity, edema, etc.): pt on 2L O2 via [place] throughout session with SpO2 > 90%; pt with 2/4 DOE with OOB mobility; educated on pursed lip breathing      Pertinent Vitals/Pain Pain Assessment: No/denies pain    Home Living                      Prior Function            PT Goals (current goals can now be found in the care plan section) Acute Rehab PT Goals PT Goal Formulation: With patient Time For Goal Achievement: 03/27/17 Potential to Achieve Goals: Good Progress towards PT goals: Progressing toward goals    Frequency    Min 2X/week  PT Plan Current plan remains appropriate    Co-evaluation              AM-PAC PT "6 Clicks" Daily Activity  Outcome Measure  Difficulty turning over in bed (including adjusting bedclothes, sheets and blankets)?: None Difficulty moving from lying on back to sitting on the side of the bed? : Total Difficulty sitting down on and standing up from a chair with arms (e.g., wheelchair, bedside commode, etc,.)?: Total Help needed moving to and from a bed to chair (including a wheelchair)?: A Little Help needed walking in hospital room?: A Little Help needed climbing 3-5 steps with a railing? : A Lot 6 Click Score: 14    End of Session Equipment Utilized During  Treatment: Gait belt;Oxygen Activity Tolerance: Patient limited by fatigue Patient left: in bed;with call bell/phone within reach Nurse Communication: Mobility status PT Visit Diagnosis: Unsteadiness on feet (R26.81);Muscle weakness (generalized) (M62.81);Difficulty in walking, not elsewhere classified (R26.2)     Time: 1829-9371 PT Time Calculation (min) (ACUTE ONLY): 17 min  Charges:  $Gait Training: 8-22 mins                    G Codes:       Earney Navy, PTA Pager: [phone]     Darliss Cheney 03/15/2017, 4:36 PM

## 2017-03-15 NOTE — Discharge Summary (Signed)
[facility]  Physician Discharge Summary   Patient ID: Olivia Randall MRN: [id number] DOB/AGE: 67-Oct-1952 67 y.o.  Admit date: 03/10/2017 Discharge date: 03/15/2017  PCP: Octavio Graves, DO  DISCHARGE DIAGNOSES:  Active Problems:   COPD exacerbation ([place])   Pressure injury of skin   Encounter for orogastric (OG) tube placement   Encounter for intubation   Malnutrition of moderate degree   RECOMMENDATIONS FOR OUTPATIENT FOLLOW UP: 1. CBC and basic metabolic panel in 1 week  DISCHARGE CONDITION: fair  Diet recommendation: Regular  Filed Weights   03/14/17 0017 03/14/17 0354 03/15/17 0412  Weight: 44.9 kg (98 lb 15.8 oz) 44.6 kg (98 lb 5.2 oz) 45.6 kg (100 lb 8.5 oz)    INITIAL HISTORY: 66 year old Caucasian female with a past medical history of COPD, chronic respiratory failure, oxygen dependent, history of dementia, depression, GERD, hypertension, multiple sclerosis was admitted to [facility] on June 24, for altered mental status. She was found to have acute on chronic hypercapnic respiratory failure. She had to be intubated. She was treated in the ICU. Sputum cultures were apparently positive for Pseudomonas, which was treated with cefepime. She failed extubation over at the other facility and so was transferred to [facility] for higher level of care. She was admitted to the intensive care unit here. She was extubated and then subsequently transferred to the floor.  Consultations:  Pulmonology  Procedures:  None  HOSPITAL COURSE:   Acute on chronic hypercapnic and hypoxemic respiratory failure. She was intubated at [facility] on 6/24, extubated 6/28, however, had to be reintubated and then transferred here to [facility]. She was in our intensive care unit. Stabilized and extubated. Seems to be doing well. Continue oxygen. Continue oral Lasix.   Acute COPD exacerbation/home O2 dependent Patient has improved. She does have advanced  COPD. She is home O2 dependent. She may continue her home medications. Nebulizers on a scheduled basis along with as needed. Continue to taper steroids.   Pseudomonas in sputum. This was identified at the outside facility. Patient was on cefepime. No infiltrates noted on chest x-ray. This could have been purulent bronchitis. Discussed with Dr. Titus Mould. He recommends about 8 days of cefepime. She will complete 7 days today. She'll need 2 more doses starting tomorrow at the skilled nursing facility through a peripheral access. Does not need a PICC line.  Acute encephalopathy. This has resolved. Now back to baseline.  History of multiple sclerosis. Stable.  History of depression and anxiety. Stable.  History of chronic back pain. Patient is on OxyIR at home. Seems to be stable currently.  Normocytic anemia. Hemoglobin noted to be slightly lower than her usual baseline. No overt bleeding. Could be dilutional. Hemoglobin stable.  Patient feels well. Ready to go to rehabilitation. Stable for discharge.   PERTINENT LABS:  The results of significant diagnostics from this hospitalization (including imaging, microbiology, ancillary and laboratory) are listed below for reference.    Microbiology: Recent Results (from the past 240 hour(s))  MRSA PCR Screening     Status: None   Collection Time: 03/10/17  1:39 PM  Result Value Ref Range Status   MRSA by PCR NEGATIVE NEGATIVE Final    Comment:        The GeneXpert MRSA Assay (FDA approved for NASAL specimens only), is one component of a comprehensive MRSA colonization surveillance program. It is not intended to diagnose MRSA infection nor to guide or monitor treatment for MRSA infections.  Labs: Basic Metabolic Panel:  Recent Labs Lab 03/10/17 1520 03/11/17 0356 03/13/17 0621 03/14/17 0506  NA 135 137 139 140  K 3.7 3.7 3.0* 4.3  CL 99* 100* 97* 97*  CO2 32 32 35* 40*  GLUCOSE 115* 108* 104* 97  BUN 21* 20  11 8   CREATININE 0.50 0.51 0.53 0.42*  CALCIUM 10.0 9.5 9.6 9.7  MG 1.6* 1.9 1.7  --   PHOS 2.9 2.1* 2.6  --    Liver Function Tests:  Recent Labs Lab 03/10/17 1520  AST 32  ALT 47  ALKPHOS 101  BILITOT 0.5  PROT 4.9*  ALBUMIN 2.3*   CBC:  Recent Labs Lab 03/10/17 1520 03/11/17 0356 03/14/17 0506  WBC 14.3* 11.1* 6.1  NEUTROABS 13.2*  --   --   HGB 9.6* 8.7* 8.9*  HCT 32.9* 29.7* 30.8*  MCV 88.4 86.6 88.3  PLT 142* 145* 276   Cardiac Enzymes:  Recent Labs Lab 03/10/17 1520  TROPONINI 0.08*   CBG:  Recent Labs Lab 03/13/17 2012 03/14/17 0101 03/14/17 0456 03/14/17 0812 03/14/17 1212  GLUCAP 127* 91 90 93 124*     IMAGING STUDIES Dg Chest Port 1 View  Result Date: 03/13/2017 CLINICAL DATA:  Ventilator support.  Endotracheal removal. EXAM: PORTABLE CHEST 1 VIEW COMPARISON:  03/11/2017 FINDINGS: Endotracheal tube and nasogastric tube have been removed. Background pattern of chronic pulmonary scarring. Chronic volume loss at the right base. Probable small right effusion. Left perihilar mass lesion visible on the previous CT not specifically visible by radiography. IMPRESSION: Chronic interstitial markings. Chronic volume loss at the right base. Possible small amount of pleural fluid on the right. Perihilar left upper lobe mass lesion shown at previous CT not visible by radiography. Electronically Signed   By: Nelson Chimes M.D.   On: 03/13/2017 10:32   Dg Chest Port 1 View  Result Date: 03/11/2017 CLINICAL DATA:  Respiratory failure. EXAM: PORTABLE CHEST 1 VIEW COMPARISON:  03/10/2017, 03/10/2017.  CT 12/05/2016 . FINDINGS: Endotracheal tube has been withdrawn minimally common the endotracheal tube remains in a low position its tip is 7 mm above the carina. Heart size stable. Stable chronic interstitial changes. Stable pleural-parenchymal thickening consistent with scarring . Reference is made to priors chest CT report 12/05/2016 for discussion of small ill-defined  density in the left upper lung. No pneumothorax . No acute bony abnormality. IMPRESSION: 1. Endotracheal tube has been withdrawn minimally, endotracheal tube remains in a low position with its tip 7 mm above the carina. NG tube in stable position. 2. Chronic interstitial changes with pleural-parenchymal scarring. 3. Reference is made to prior chest CT report 12/05/2016 for discussion of small ill-defined left upper lobe lesion. These results will be called to the ordering clinician or representative by the Radiologist Assistant, and communication documented in the PACS or zVision Dashboard. Electronically Signed   By: Marcello Moores  Register   On: 03/11/2017 06:27   Dg Chest Port 1 View  Result Date: 03/10/2017 CLINICAL DATA:  Respiratory failure, intubated patient. EXAM: PORTABLE CHEST 1 VIEW COMPARISON:  Portable chest x-ray of March 10, 2017 at 10:43 a.m. FINDINGS: The lungs are hyperinflated with hemidiaphragm flattening. There are small bilateral pleural effusions. The heart and pulmonary vascularity are normal. There is calcification in the wall of the aortic arch. The endotracheal tube tip lies at the origin of the right mainstem bronchus. The esophagogastric tube tip projects below the inferior margin of the image. IMPRESSION: Again demonstrated is low positioning of the endotracheal tube.  Withdrawal by 3-4 cm is recommended to avoid accidental right mainstem bronchus intubation. The esophagogastric tube tip projects below the inferior margin of the image. The remainder of the study is stable. These results will be called to the ordering clinician or representative by the Radiologist Assistant, and communication documented in the PACS or zVision Dashboard. Electronically Signed   By: David  Martinique M.D.   On: 03/10/2017 14:55   Dg Abd Portable 1v  Result Date: 03/10/2017 CLINICAL DATA:  Assess orogastric tube placement. EXAM: PORTABLE ABDOMEN - 1 VIEW COMPARISON:  Chest x-ray of today's date FINDINGS: The  orogastric tube tip in proximal port lie in the region of the gastric body. There are few loops of mildly distended gas-filled bowel within the abdomen. The stool and gas in the rectum. IMPRESSION: The esophagogastric tube appears be appropriately positioned within the stomach. Electronically Signed   By: David  Martinique M.D.   On: 03/10/2017 14:56    DISCHARGE EXAMINATION: Vitals:   03/15/17 0820 03/15/17 0823 03/15/17 0826 03/15/17 0938  BP:    (!) 138/59  Pulse:    76  Resp:    18  Temp:    97.9 F (36.6 C)  TempSrc:    Oral  SpO2: 99% 99% 99% 99%  Weight:      Height:       General appearance: alert, cooperative and no distress Resp: Coarse breath sounds bilaterally. No wheezing heard today. No rhonchi. Cardio: regular rate and rhythm, S1, S2 normal, no murmur, click, rub or gallop GI: soft, non-tender; bowel sounds normal; no masses,  no organomegaly  DISPOSITION: Skilled nursing facility  Discharge Instructions    Call MD for:  extreme fatigue    Complete by:  As directed    Call MD for:  persistant dizziness or light-headedness    Complete by:  As directed    Call MD for:  persistant nausea and vomiting    Complete by:  As directed    Call MD for:  severe uncontrolled pain    Complete by:  As directed    Call MD for:  temperature >100.4    Complete by:  As directed    Discharge instructions    Complete by:  As directed    Please see instructions on the discharge summary.  You were cared for by a hospitalist during your hospital stay. If you have any questions about your discharge medications or the care you received while you were in the hospital after you are discharged, you can call the unit and asked to speak with the hospitalist on call if the hospitalist that took care of you is not available. Once you are discharged, your primary care physician will handle any further medical issues. Please note that NO REFILLS for any discharge medications will be authorized once you  are discharged, as it is imperative that you return to your primary care physician (or establish a relationship with a primary care physician if you do not have one) for your aftercare needs so that they can reassess your need for medications and monitor your lab values. If you do not have a primary care physician, you can call [phone] for a physician referral.   Increase activity slowly    Complete by:  As directed       ALLERGIES:  Allergies  Allergen Reactions  . Clopidogrel Other (See Comments)    Confusion and behavioral changes (altered)     Current Discharge Medication List    START  taking these medications   Details  ceFEPIme 1 g in dextrose 5 % 50 mL Inject 1 g into the vein every 12 (twelve) hours. Give for 2 doses starting 03/16/17.    furosemide (LASIX) 40 MG tablet Take 1 tablet (40 mg total) by mouth daily. Qty: 30 tablet      CONTINUE these medications which have CHANGED   Details  fluticasone furoate-vilanterol (BREO ELLIPTA) 200-25 MCG/INH AEPB Inhale 1 puff into the lungs daily.    !! ipratropium-albuterol (DUONEB) 0.5-2.5 (3) MG/3ML SOLN Take 3 mLs by nebulization 3 (three) times daily. Qty: 360 mL    !! ipratropium-albuterol (DUONEB) 0.5-2.5 (3) MG/3ML SOLN Take 3 mLs by nebulization every 4 (four) hours as needed. Qty: 360 mL    oxyCODONE (OXY IR/ROXICODONE) 5 MG immediate release tablet Take 1-2 tablets (5-10 mg total) by mouth every 6 (six) hours as needed for severe pain. Qty: 30 tablet, Refills: 0    predniSONE (DELTASONE) 10 MG tablet Take 4 tabs daily for 3 days, then 3 tabs daily for 3 days, then 2 tabs daily for 3 days, then 1 tab daily for 3 days, then stop. Qty: 30 tablet, Refills: 0     !! - Potential duplicate medications found. Please discuss with provider.    CONTINUE these medications which have NOT CHANGED   Details  donepezil (ARICEPT) 10 MG tablet Take 10 mg by mouth at bedtime.     esomeprazole (NEXIUM) 40 MG capsule Take 40 mg by  mouth daily.    FLUoxetine (PROZAC) 40 MG capsule Take 40-80 mg by mouth daily.    sucralfate (CARAFATE) 1 g tablet Take 1 g by mouth 4 (four) times daily -  with meals and at bedtime.    acetaminophen (TYLENOL) 325 MG tablet Take 2 tablets (650 mg total) by mouth every 6 (six) hours as needed for mild pain, moderate pain, fever or headache (or Fever >/= 101).      STOP taking these medications     albuterol (PROVENTIL) (2.5 MG/3ML) 0.083% nebulizer solution      ipratropium (ATROVENT) 0.02 % nebulizer solution      levofloxacin (LEVAQUIN) 750 MG tablet      tiotropium (SPIRIVA) 18 MCG inhalation capsule      ALPRAZolam (XANAX) 0.25 MG tablet          Follow-up Information    Octavio Graves, DO. Schedule an appointment as soon as possible for a visit in 3 week(s).   Contact information: [address] [place] [address] [phone]           TOTAL DISCHARGE TIME: 35 minutes  [facility] Pager [phone]  03/15/2017, 10:19 AM

## 2017-03-15 NOTE — Clinical Social Work Placement (Signed)
   CLINICAL SOCIAL WORK PLACEMENT  NOTE  Date:  03/15/2017  Patient Details  Name: Olivia Randall MRN: [id number] Date of Birth: [birth year]  Clinical Social Work is seeking post-discharge placement for this patient at the [place] level of care (*CSW will initial, date and re-position this form in  chart as items are completed):  Yes   Patient/family provided with [place] Work Department's list of facilities offering this level of care within the geographic area requested by the patient (or if unable, by the patient's family).  Yes   Patient/family informed of their freedom to choose among providers that offer the needed level of care, that participate in Medicare, Medicaid or managed care program needed by the patient, have an available bed and are willing to accept the patient.  Yes   Patient/family informed of [facility]'s ownership interest in [facility] and [facility], as well as of the fact that they are under no obligation to receive care at these facilities.  PASRR submitted to EDS on 03/14/17     PASRR number received on 03/14/17     Existing PASRR number confirmed on       FL2 transmitted to all facilities in geographic area requested by pt/family on       FL2 transmitted to all facilities within larger geographic area on       Patient informed that his/her managed care company has contracts with or will negotiate with certain facilities, including the following:        Yes   Patient/family informed of bed offers received.  Patient chooses bed at [facility]     Physician recommends and patient chooses bed at      Patient to be transferred to [facility] on 03/15/17.  Patient to be transferred to facility by Granddaughter by car     Patient family notified on 03/15/17 of transfer.  Name of family member notified:  Kristen     PHYSICIAN       Additional Comment:     _______________________________________________ Benard Halsted, [place] 03/15/2017, 10:19 AM

## 2017-03-15 NOTE — Progress Notes (Signed)
CSW alerted patient's granddaughter that we would schedule PTAR to come pick up the patient in the event granddaughter can't get to hospital. RN please cancel PTAR [phone]) if granddaughter shows up and is able to transport patient to [facility].  Percell Locus Rockie Vawter LCSWA [phone]

## 2017-03-15 NOTE — Discharge Instructions (Signed)

## 2017-03-15 NOTE — Progress Notes (Signed)
Pt prepared for d/c to SNF. IV left in place, clean, dry, intact for IV anbx. Skin intact except as charted in most recent assessments. Vitals are stable. Report called to receiving facility. Pt to be transported by ambulance service.

## 2017-03-15 NOTE — Progress Notes (Signed)
Patient will DC to: [facility] [place] Anticipated DC date: 03/15/17 Family notified: Daughter, Acupuncturist by: Granddaughter will transport by car   Per MD patient ready for DC to [facility]. Insurance authorization received. RN, patient, patient's family, and facility notified of DC. Discharge Summary sent to facility. RN given number for report [phone]).   CSW signing off.  Cedric Fishman, [place] Social Worker [phone]

## 2017-03-15 NOTE — Progress Notes (Signed)
  Speech Language Pathology Treatment: Dysphagia  Patient Details Name: Olivia Randall MRN: [id number] DOB: 1951/01/14 Today's Date: 03/15/2017 Time: 8016-5537 SLP Time Calculation (min) (ACUTE ONLY): 13 min  Assessment / Plan / Recommendation Clinical Impression  Pt less dyspneic during session (completed breakfast 20-30 min prior). Noted indications of esophageal dysphagia today with eructation and reporting globus sensation. Pt has history of GERD and SLP educated re: relationship between globus and GERD as well as reflux precautions. Explained role of gastroenterology and advised to speak with PCP and consider if symptoms worsen. Reiterated need for rest breaks given COPD. Continue regular texture and thin liquids. No further ST needed.    HPI HPI: Pt is a 66 y.o. female with PMH Anxiety and depression; Asthma; Chronic back pain; Chronic neck pain; COPD (chronic obstructive pulmonary disease) ([place]); Dementia; Depression; GERD (gastroesophageal reflux disease); Hypertension; MS (multiple sclerosis) ([place]); MS (multiple sclerosis) ([place]); On home O2; Shortness of breath dyspnea; and Sleep apnea. Pt was admitted to [facility] on 6/24 for AMS. She was found to have acute on chronic hypercapnic respiratory failure and required intubation. Was extubated 6/28 but then declined and was reintubated and transferred to [facility]; extubated 6/29.Apparently one of the family members had mentioned that she might have taken excessive medications the night prior and it was unclear if this was suicidal attempt or not. Apparently patient lost her husband 2 months prior and ever since then she had basically lost her will to live.She lives with her grandchildren and family members had also expressed concern as to whether or not her grandchildren with taking her medications and selling them as well as not taking care of patient appropriately. Of note pt followed by speech therapy in 2017 following extubation; had FEES  with recommendation of regular/ thin liquids. Bedside swallow eval ordered.       SLP Plan  All goals met;Discharge SLP treatment due to (comment)       Recommendations  Diet recommendations: Regular;Thin liquid Liquids provided via: Cup;Straw Medication Administration: Whole meds with liquid Supervision: Patient able to self feed Compensations: Slow rate;Small sips/bites (rest breaks when needed) Postural Changes and/or Swallow Maneuvers: Seated upright 90 degrees;Upright 30-60 min after meal                Oral Care Recommendations: Oral care BID Follow up Recommendations: None SLP Visit Diagnosis: Dysphagia, unspecified (R13.10) Plan: All goals met;Discharge SLP treatment due to (comment)       GO                [name] 03/15/2017, 10:56 AM  [name].Ed [employer] [phone]

## 2017-04-01 ENCOUNTER — Encounter: Payer: Self-pay | Admitting: Physician Assistant

## 2017-04-01 ENCOUNTER — Ambulatory Visit (INDEPENDENT_AMBULATORY_CARE_PROVIDER_SITE_OTHER): Payer: Medicare HMO | Admitting: Physician Assistant

## 2017-04-01 VITALS — BP 148/80 | HR 93 | Temp 97.7°F | Ht 63.0 in | Wt 94.0 lb

## 2017-04-01 DIAGNOSIS — F329 Major depressive disorder, single episode, unspecified: Secondary | ICD-10-CM | POA: Diagnosis not present

## 2017-04-01 DIAGNOSIS — K219 Gastro-esophageal reflux disease without esophagitis: Secondary | ICD-10-CM | POA: Diagnosis not present

## 2017-04-01 DIAGNOSIS — J441 Chronic obstructive pulmonary disease with (acute) exacerbation: Secondary | ICD-10-CM

## 2017-04-01 DIAGNOSIS — F419 Anxiety disorder, unspecified: Secondary | ICD-10-CM | POA: Diagnosis not present

## 2017-04-01 DIAGNOSIS — F32A Depression, unspecified: Secondary | ICD-10-CM

## 2017-04-01 MED ORDER — IPRATROPIUM-ALBUTEROL 0.5-2.5 (3) MG/3ML IN SOLN: 3.0000 mL | Freq: Three times a day (TID) | RESPIRATORY_TRACT | Status: AC

## 2017-04-01 MED ORDER — FUROSEMIDE 40 MG PO TABS: 40.0000 mg | ORAL_TABLET | Freq: Every day | ORAL | 11 refills | Status: AC

## 2017-04-01 MED ORDER — ESOMEPRAZOLE MAGNESIUM 40 MG PO CPDR: 40.0000 mg | DELAYED_RELEASE_CAPSULE | Freq: Every day | ORAL | 11 refills | Status: AC

## 2017-04-01 MED ORDER — OXYCODONE-ACETAMINOPHEN 5-325 MG PO TABS: 1.0000 | ORAL_TABLET | Freq: Three times a day (TID) | ORAL | 0 refills | Status: DC | PRN

## 2017-04-01 MED ORDER — FLUOXETINE HCL 40 MG PO CAPS: 40.0000 mg | ORAL_CAPSULE | Freq: Every day | ORAL | 11 refills | Status: AC

## 2017-04-01 MED ORDER — ALPRAZOLAM 0.5 MG PO TABS: 0.5000 mg | ORAL_TABLET | Freq: Two times a day (BID) | ORAL | 1 refills | Status: DC | PRN

## 2017-04-01 MED ORDER — FLUTICASONE FUROATE-VILANTEROL 200-25 MCG/INH IN AEPB: 1.0000 | INHALATION_SPRAY | Freq: Every day | RESPIRATORY_TRACT | 11 refills | Status: AC

## 2017-04-01 NOTE — Patient Instructions (Signed)
In a few days you may receive a survey in the mail or online from [employer] regarding your visit with us today. Please take a moment to fill this out. Your feedback is very important to our whole office. It can help us better understand your needs as well as improve your experience and satisfaction. Thank you for taking your time to complete it. We care about you.  Shelaine Frie, PA-C  

## 2017-04-02 ENCOUNTER — Emergency Department (HOSPITAL_COMMUNITY): Payer: Medicare HMO

## 2017-04-02 ENCOUNTER — Encounter (HOSPITAL_COMMUNITY): Payer: Self-pay

## 2017-04-02 ENCOUNTER — Inpatient Hospital Stay (HOSPITAL_COMMUNITY)
Admission: EM | Admit: 2017-04-02 | Discharge: 2017-04-06 | DRG: 190 | Disposition: A | Discharge status: Hospice | Payer: Medicare HMO | Attending: Internal Medicine | Admitting: Internal Medicine

## 2017-04-02 ENCOUNTER — Inpatient Hospital Stay (HOSPITAL_COMMUNITY): Payer: Medicare HMO

## 2017-04-02 DIAGNOSIS — F0390 Unspecified dementia without behavioral disturbance: Secondary | ICD-10-CM | POA: Diagnosis present

## 2017-04-02 DIAGNOSIS — F17210 Nicotine dependence, cigarettes, uncomplicated: Secondary | ICD-10-CM | POA: Diagnosis present

## 2017-04-02 DIAGNOSIS — M545 Low back pain: Secondary | ICD-10-CM

## 2017-04-02 DIAGNOSIS — J9620 Acute and chronic respiratory failure, unspecified whether with hypoxia or hypercapnia: Secondary | ICD-10-CM | POA: Diagnosis not present

## 2017-04-02 DIAGNOSIS — M542 Cervicalgia: Secondary | ICD-10-CM | POA: Diagnosis present

## 2017-04-02 DIAGNOSIS — F419 Anxiety disorder, unspecified: Secondary | ICD-10-CM | POA: Diagnosis present

## 2017-04-02 DIAGNOSIS — C787 Secondary malignant neoplasm of liver and intrahepatic bile duct: Secondary | ICD-10-CM | POA: Diagnosis present

## 2017-04-02 DIAGNOSIS — M549 Dorsalgia, unspecified: Secondary | ICD-10-CM | POA: Diagnosis present

## 2017-04-02 DIAGNOSIS — G35 Multiple sclerosis: Secondary | ICD-10-CM | POA: Diagnosis present

## 2017-04-02 DIAGNOSIS — R739 Hyperglycemia, unspecified: Secondary | ICD-10-CM | POA: Diagnosis present

## 2017-04-02 DIAGNOSIS — G4733 Obstructive sleep apnea (adult) (pediatric): Secondary | ICD-10-CM | POA: Diagnosis present

## 2017-04-02 DIAGNOSIS — C3412 Malignant neoplasm of upper lobe, left bronchus or lung: Secondary | ICD-10-CM | POA: Diagnosis present

## 2017-04-02 DIAGNOSIS — Z9049 Acquired absence of other specified parts of digestive tract: Secondary | ICD-10-CM

## 2017-04-02 DIAGNOSIS — I110 Hypertensive heart disease with heart failure: Secondary | ICD-10-CM | POA: Diagnosis present

## 2017-04-02 DIAGNOSIS — G8929 Other chronic pain: Secondary | ICD-10-CM | POA: Diagnosis present

## 2017-04-02 DIAGNOSIS — J9622 Acute and chronic respiratory failure with hypercapnia: Secondary | ICD-10-CM | POA: Diagnosis present

## 2017-04-02 DIAGNOSIS — F17200 Nicotine dependence, unspecified, uncomplicated: Secondary | ICD-10-CM | POA: Diagnosis present

## 2017-04-02 DIAGNOSIS — J9621 Acute and chronic respiratory failure with hypoxia: Secondary | ICD-10-CM | POA: Diagnosis present

## 2017-04-02 DIAGNOSIS — M5440 Lumbago with sciatica, unspecified side: Secondary | ICD-10-CM | POA: Diagnosis not present

## 2017-04-02 DIAGNOSIS — J441 Chronic obstructive pulmonary disease with (acute) exacerbation: Secondary | ICD-10-CM | POA: Diagnosis present

## 2017-04-02 DIAGNOSIS — Z515 Encounter for palliative care: Secondary | ICD-10-CM

## 2017-04-02 DIAGNOSIS — Z681 Body mass index (BMI) 19 or less, adult: Secondary | ICD-10-CM

## 2017-04-02 DIAGNOSIS — Z79899 Other long term (current) drug therapy: Secondary | ICD-10-CM

## 2017-04-02 DIAGNOSIS — Z888 Allergy status to other drugs, medicaments and biological substances status: Secondary | ICD-10-CM

## 2017-04-02 DIAGNOSIS — I5032 Chronic diastolic (congestive) heart failure: Secondary | ICD-10-CM | POA: Diagnosis present

## 2017-04-02 DIAGNOSIS — R4182 Altered mental status, unspecified: Secondary | ICD-10-CM | POA: Diagnosis present

## 2017-04-02 DIAGNOSIS — Z7189 Other specified counseling: Secondary | ICD-10-CM | POA: Diagnosis not present

## 2017-04-02 DIAGNOSIS — E43 Unspecified severe protein-calorie malnutrition: Secondary | ICD-10-CM | POA: Diagnosis present

## 2017-04-02 DIAGNOSIS — K219 Gastro-esophageal reflux disease without esophagitis: Secondary | ICD-10-CM | POA: Diagnosis present

## 2017-04-02 DIAGNOSIS — Z9981 Dependence on supplemental oxygen: Secondary | ICD-10-CM

## 2017-04-02 DIAGNOSIS — Z66 Do not resuscitate: Secondary | ICD-10-CM | POA: Diagnosis present

## 2017-04-02 DIAGNOSIS — F329 Major depressive disorder, single episode, unspecified: Secondary | ICD-10-CM | POA: Diagnosis present

## 2017-04-02 DIAGNOSIS — Z90710 Acquired absence of both cervix and uterus: Secondary | ICD-10-CM

## 2017-04-02 DIAGNOSIS — F32A Depression, unspecified: Secondary | ICD-10-CM | POA: Diagnosis present

## 2017-04-02 HISTORY — DX: Heart failure, unspecified: I50.9

## 2017-04-02 LAB — CBC WITH DIFFERENTIAL/PLATELET
Basophils Absolute: 0 10*3/uL (ref 0.0–0.1)
Basophils Relative: 0 %
EOS ABS: 0 10*3/uL (ref 0.0–0.7)
EOS PCT: 0 %
HCT: 41.3 % (ref 36.0–46.0)
HEMOGLOBIN: 12 g/dL (ref 12.0–15.0)
LYMPHS ABS: 0.5 10*3/uL — AB (ref 0.7–4.0)
Lymphocytes Relative: 7 %
MCH: 26.8 pg (ref 26.0–34.0)
MCHC: 29.1 g/dL — AB (ref 30.0–36.0)
MCV: 92.4 fL (ref 78.0–100.0)
MONOS PCT: 5 %
Monocytes Absolute: 0.3 10*3/uL (ref 0.1–1.0)
Neutro Abs: 6.3 10*3/uL (ref 1.7–7.7)
Neutrophils Relative %: 88 %
Platelets: 227 10*3/uL (ref 150–400)
RBC: 4.47 MIL/uL (ref 3.87–5.11)
RDW: 13.8 % (ref 11.5–15.5)
WBC: 7.2 10*3/uL (ref 4.0–10.5)

## 2017-04-02 LAB — BLOOD GAS, ARTERIAL
ACID-BASE EXCESS: 14 mmol/L — AB (ref 0.0–2.0)
Acid-Base Excess: 14.6 mmol/L — ABNORMAL HIGH (ref 0.0–2.0)
BICARBONATE: 34.9 mmol/L — AB (ref 20.0–28.0)
Bicarbonate: 35.8 mmol/L — ABNORMAL HIGH (ref 20.0–28.0)
Delivery systems: POSITIVE
Drawn by: 277331
Drawn by: 22223
Expiratory PAP: 6
FIO2: 40
Inspiratory PAP: 14
LHR: 8 {breaths}/min
O2 Content: 2 L/min
O2 Saturation: 98.2 %
O2 Saturation: 97.8 %
PCO2 ART: 115 mmHg — AB (ref 32.0–48.0)
PCO2 ART: 82.2 mmHg — AB (ref 32.0–48.0)
PH ART: 7.196 — AB (ref 7.350–7.450)
PH ART: 7.315 — AB (ref 7.350–7.450)
Patient temperature: 37
pO2, Arterial: 128 mmHg — ABNORMAL HIGH (ref 83.0–108.0)
pO2, Arterial: 100 mmHg (ref 83.0–108.0)

## 2017-04-02 LAB — COMPREHENSIVE METABOLIC PANEL
ALK PHOS: 136 U/L — AB (ref 38–126)
ALT: 13 U/L — ABNORMAL LOW (ref 14–54)
ANION GAP: 4 — AB (ref 5–15)
AST: 13 U/L — ABNORMAL LOW (ref 15–41)
Albumin: 3.1 g/dL — ABNORMAL LOW (ref 3.5–5.0)
BUN: 7 mg/dL (ref 6–20)
CALCIUM: 10.5 mg/dL — AB (ref 8.9–10.3)
CO2: 44 mmol/L — AB (ref 22–32)
Chloride: 92 mmol/L — ABNORMAL LOW (ref 101–111)
Creatinine, Ser: 0.35 mg/dL — ABNORMAL LOW (ref 0.44–1.00)
Glucose, Bld: 120 mg/dL — ABNORMAL HIGH (ref 65–99)
Potassium: 4.6 mmol/L (ref 3.5–5.1)
SODIUM: 140 mmol/L (ref 135–145)
TOTAL PROTEIN: 6.2 g/dL — AB (ref 6.5–8.1)
Total Bilirubin: 0.6 mg/dL (ref 0.3–1.2)

## 2017-04-02 LAB — MAGNESIUM: MAGNESIUM: 1.5 mg/dL — AB (ref 1.7–2.4)

## 2017-04-02 LAB — LACTIC ACID, PLASMA
Lactic Acid, Venous: 0.8 mmol/L (ref 0.5–1.9)
Lactic Acid, Venous: 1 mmol/L (ref 0.5–1.9)

## 2017-04-02 LAB — AMMONIA: AMMONIA: 52 umol/L — AB (ref 9–35)

## 2017-04-02 LAB — PREALBUMIN: Prealbumin: 9.8 mg/dL — ABNORMAL LOW (ref 18–38)

## 2017-04-02 LAB — MRSA PCR SCREENING: MRSA BY PCR: NEGATIVE

## 2017-04-02 LAB — PHOSPHORUS: PHOSPHORUS: 3.6 mg/dL (ref 2.5–4.6)

## 2017-04-02 LAB — PROCALCITONIN

## 2017-04-02 LAB — ETHANOL

## 2017-04-02 LAB — SALICYLATE LEVEL: Salicylate Lvl: <7 mg/dL (ref 2.8–30.0)

## 2017-04-02 LAB — ACETAMINOPHEN LEVEL

## 2017-04-02 LAB — TROPONIN I: Troponin I: <0.03 ng/mL (ref <0.03)

## 2017-04-02 MED ORDER — SODIUM CHLORIDE 0.45 % IV SOLN
INTRAVENOUS | Status: DC
  Administered 2017-04-02: 21:00:00 via INTRAVENOUS

## 2017-04-02 MED ORDER — ALPRAZOLAM 0.5 MG PO TABS
0.5000 mg | ORAL_TABLET | Freq: Two times a day (BID) | ORAL | Status: DC | PRN
  Administered 2017-04-06: 0.5 mg via ORAL
  Filled 2017-04-02: qty 1

## 2017-04-02 MED ORDER — DONEPEZIL HCL 5 MG PO TABS
10.0000 mg | ORAL_TABLET | Freq: Every day | ORAL | Status: DC
  Administered 2017-04-03 – 2017-04-05 (×3): 10 mg via ORAL
  Filled 2017-04-02 (×3): qty 2

## 2017-04-02 MED ORDER — PANTOPRAZOLE SODIUM 40 MG PO TBEC
80.0000 mg | DELAYED_RELEASE_TABLET | Freq: Every day | ORAL | Status: DC
  Administered 2017-04-03 – 2017-04-06 (×4): 80 mg via ORAL
  Filled 2017-04-02 (×4): qty 2

## 2017-04-02 MED ORDER — METHYLPREDNISOLONE SODIUM SUCC 125 MG IJ SOLR
125.0000 mg | Freq: Once | INTRAMUSCULAR | Status: AC
  Administered 2017-04-02: 125 mg via INTRAVENOUS
  Filled 2017-04-02: qty 2

## 2017-04-02 MED ORDER — FLUTICASONE FUROATE-VILANTEROL 200-25 MCG/INH IN AEPB
1.0000 | INHALATION_SPRAY | Freq: Every day | RESPIRATORY_TRACT | Status: DC
  Administered 2017-04-03 – 2017-04-05 (×3): 1 via RESPIRATORY_TRACT
  Filled 2017-04-02: qty 28

## 2017-04-02 MED ORDER — ONDANSETRON HCL 4 MG PO TABS: 4.0000 mg | ORAL_TABLET | Freq: Four times a day (QID) | ORAL | Status: DC | PRN

## 2017-04-02 MED ORDER — IOPAMIDOL (ISOVUE-300) INJECTION 61%
75.0000 mL | Freq: Once | INTRAVENOUS | Status: AC | PRN
  Administered 2017-04-02: 75 mL via INTRAVENOUS

## 2017-04-02 MED ORDER — FUROSEMIDE 40 MG PO TABS
40.0000 mg | ORAL_TABLET | Freq: Every day | ORAL | Status: DC
  Administered 2017-04-03 – 2017-04-06 (×4): 40 mg via ORAL
  Filled 2017-04-02 (×4): qty 1

## 2017-04-02 MED ORDER — IPRATROPIUM-ALBUTEROL 0.5-2.5 (3) MG/3ML IN SOLN
3.0000 mL | RESPIRATORY_TRACT | Status: DC
  Administered 2017-04-02 (×2): 3 mL via RESPIRATORY_TRACT
  Filled 2017-04-02 (×2): qty 3

## 2017-04-02 MED ORDER — ALBUTEROL SULFATE (2.5 MG/3ML) 0.083% IN NEBU: 5.0000 mg | INHALATION_SOLUTION | Freq: Once | RESPIRATORY_TRACT | Status: DC

## 2017-04-02 MED ORDER — ENOXAPARIN SODIUM 30 MG/0.3ML ~~LOC~~ SOLN
30.0000 mg | SUBCUTANEOUS | Status: DC
  Administered 2017-04-02 – 2017-04-05 (×4): 30 mg via SUBCUTANEOUS
  Filled 2017-04-02 (×4): qty 0.3

## 2017-04-02 MED ORDER — ALBUTEROL (5 MG/ML) CONTINUOUS INHALATION SOLN: 10.0000 mg/h | INHALATION_SOLUTION | RESPIRATORY_TRACT | Status: DC

## 2017-04-02 MED ORDER — METHYLPREDNISOLONE SODIUM SUCC 125 MG IJ SOLR
60.0000 mg | Freq: Three times a day (TID) | INTRAMUSCULAR | Status: DC
  Administered 2017-04-03 – 2017-04-04 (×4): 60 mg via INTRAVENOUS
  Filled 2017-04-02 (×5): qty 2

## 2017-04-02 MED ORDER — SODIUM CHLORIDE 0.9 % IV BOLUS (SEPSIS)
500.0000 mL | Freq: Once | INTRAVENOUS | Status: AC
  Administered 2017-04-02: 500 mL via INTRAVENOUS

## 2017-04-02 MED ORDER — FLUOXETINE HCL 20 MG PO CAPS
40.0000 mg | ORAL_CAPSULE | Freq: Every day | ORAL | Status: DC
  Administered 2017-04-03 – 2017-04-06 (×4): 40 mg via ORAL
  Filled 2017-04-02 (×2): qty 2
  Filled 2017-04-02: qty 4
  Filled 2017-04-02: qty 2

## 2017-04-02 MED ORDER — LEVOFLOXACIN IN D5W 500 MG/100ML IV SOLN
500.0000 mg | Freq: Once | INTRAVENOUS | Status: AC
  Administered 2017-04-02: 500 mg via INTRAVENOUS
  Filled 2017-04-02 (×2): qty 100

## 2017-04-02 MED ORDER — FLUOXETINE HCL 40 MG PO CAPS: 40.0000 mg | ORAL_CAPSULE | Freq: Every day | ORAL | Status: DC

## 2017-04-02 MED ORDER — OXYCODONE-ACETAMINOPHEN 5-325 MG PO TABS
1.0000 | ORAL_TABLET | Freq: Three times a day (TID) | ORAL | Status: DC | PRN
Start: 2017-04-02 — End: 2017-04-06
  Filled 2017-04-02: qty 1

## 2017-04-02 MED ORDER — LEVOFLOXACIN IN D5W 500 MG/100ML IV SOLN
500.0000 mg | INTRAVENOUS | Status: DC
  Administered 2017-04-03: 500 mg via INTRAVENOUS
  Filled 2017-04-02: qty 100

## 2017-04-02 MED ORDER — ONDANSETRON HCL 4 MG/2ML IJ SOLN
4.0000 mg | Freq: Four times a day (QID) | INTRAMUSCULAR | Status: DC | PRN
  Administered 2017-04-04: 4 mg via INTRAVENOUS
  Filled 2017-04-02: qty 2

## 2017-04-02 NOTE — ED Notes (Signed)
ABG critical results given to MD

## 2017-04-02 NOTE — ED Provider Notes (Signed)
[name] Provider Note   CSN: [id number] Arrival date & time: 04/02/17  1632     History   Chief Complaint Chief Complaint  Patient presents with  . Altered Mental Status    HPI Olivia Randall is a 66 y.o. female.  HPI Patient presents with increased lethargy of the last 24 hours per EMS. Patient unable to contribute to history due to altered mental status. Level V caveat applies. Past Medical History:  Diagnosis Date  . Anxiety and depression   . Asthma   . CHF (congestive heart failure) ([place])   . Chronic back pain   . Chronic neck pain   . COPD (chronic obstructive pulmonary disease) ([place])   . 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 Dementia   . Depression   . GERD (gastroesophageal reflux disease)   . Hypertension   . MS (multiple sclerosis) ([place])   . MS (multiple sclerosis) ([place])    staes xrays showed signs of ms  . On home O2    2L N/C  . Shortness of breath dyspnea   . Sleep apnea     Patient Active Problem List   Diagnosis Date Noted  . Palliative care encounter   . Goals of care, counseling/discussion   . Encounter for hospice care discussion   . Acute on chronic respiratory failure ([place]) 04/02/2017  . Severe protein-calorie malnutrition [name]: less than 60% of standard weight) ([place]) 04/02/2017  . Malnutrition of moderate degree 03/12/2017  . Encounter for orogastric (OG) tube placement   . Encounter for intubation   . COPD exacerbation ([place]) 03/10/2017  . Pressure injury of skin 03/10/2017  . Acute respiratory failure ([place]) 01/23/2017  . Altered mental status 01/23/2017  . Normocytic anemia 01/23/2017  . Acute respiratory failure with hypercapnia ([place]) 09/05/2016  . Essential hypertension 11/08/2015  . Mucosal abnormality of stomach   . Hyponatremia 03/18/2015  . Sleep apnea   . Anxiety and depression   . Dysphagia, pharyngoesophageal phase 02/18/2015  . OSA (obstructive sleep apnea) 05/20/2013  . Tobacco use disorder  05/20/2013  . Dizzy 12/04/2012  . COPD with acute exacerbation ([place]) 12/03/2012  . Chronic respiratory failure ([place]) 12/03/2012  . Hypokalemia 12/03/2012  . Chronic back pain 12/03/2012  . GERD (gastroesophageal reflux disease) 12/03/2012    Past Surgical History:  Procedure Laterality Date  . ABDOMINAL HYSTERECTOMY    . BIOPSY  04/24/2015   Procedure: BIOPSY (Gastric);  Surgeon: [name], MD;  Location: [facility];  Service: Endoscopy;;  . BLADDER SURGERY    . BREAST SURGERY    . CARPAL TUNNEL RELEASE    . CHOLECYSTECTOMY    . ESOPHAGOGASTRODUODENOSCOPY  2009   Dr. Laural Golden: small sliding hiatal hernia with mild reflux esophagitis, empiric dilation with 54/56 F, nonerosive antral gastritis   . ESOPHAGOGASTRODUODENOSCOPY (EGD) WITH PROPOFOL N/A 04/24/2015   RMR: normal esophagus status post Maloney dilation. 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   [name] HEMORRHOID SURGERY    . MALONEY DILATION N/A 04/24/2015   Procedure: MALONEY ESOPHAGEAL DILATION (54FR);  Surgeon: [name], MD;  Location: [facility];  Service: Endoscopy;  Laterality: N/A;  . NECK SURGERY    . SHOULDER SURGERY Right     OB History    Gravida Para Term Preterm AB Living   1 1 1          SAB TAB Ectopic Multiple Live Births  Home Medications    Prior to Admission medications   Medication Sig Start Date End Date Taking? Authorizing Provider  acetaminophen (TYLENOL) 325 MG tablet Take 2 tablets (650 mg total) by mouth every 6 (six) hours as needed for mild pain, moderate pain, fever or headache (or Fever >/= 101). 01/27/17  Yes Hongalgi, Lenis Dickinson, MD  donepezil (ARICEPT) 10 MG tablet Take 10 mg by mouth at bedtime.    Yes [provider]  esomeprazole (NEXIUM) 40 MG capsule Take 1 capsule (40 mg total) by mouth daily. 04/01/17  Yes Terald Sleeper, PA-C  FLUoxetine (PROZAC) 40 MG capsule Take 1-2 capsules (40-80 mg total) by mouth daily. 04/01/17  Yes  Terald Sleeper, PA-C  fluticasone furoate-vilanterol (BREO ELLIPTA) 200-25 MCG/INH AEPB Inhale 1 puff into the lungs daily. 04/01/17  Yes Terald Sleeper, PA-C  furosemide (LASIX) 40 MG tablet Take 1 tablet (40 mg total) by mouth daily. 04/01/17  Yes Terald Sleeper, PA-C  ipratropium-albuterol (DUONEB) 0.5-2.5 (3) MG/3ML SOLN Take 3 mLs by nebulization 3 (three) times daily. 04/01/17  Yes Terald Sleeper, PA-C  ALPRAZolam Duanne Moron) 0.5 MG tablet Take 1 tablet (0.5 mg total) by mouth 2 (two) times daily as needed for anxiety. 04/06/17   Isaac Bliss, Rayford Halsted, MD  oxyCODONE-acetaminophen (ROXICET) 5-325 MG tablet Take 1 tablet by mouth every 8 (eight) hours as needed for severe pain. Chronic pain 04/06/17   Isaac Bliss, Rayford Halsted, MD  predniSONE (DELTASONE) 10 MG tablet Take 1 tablet (10 mg total) by mouth daily with breakfast. Take 6 tablets today and then decrease by 1 tablet daily until none are left. 04/06/17   Isaac Bliss, Rayford Halsted, MD    Family History Family History  Problem Relation Age of Onset  . Diabetes Mother   . Diabetes Father   . Diabetes Brother   . Colon cancer Neg Hx     Social History Social History  Substance Use Topics  . Smoking status: Current Every Day Smoker    Types: Cigarettes  . Smokeless tobacco: Never Used     Comment: 3-4 cigarettes a day  . Alcohol use No     Allergies   Clopidogrel   Review of Systems Review of Systems  Unable to perform ROS: Mental status change     Physical Exam Updated Vital Signs BP 133/67 (BP Location: Right Arm)   Pulse 93   Temp 98 F (36.7 C) (Axillary)   Resp 17   Ht 5\' 4"  (1.626 m)   Wt 44.2 kg (97 lb 7.1 oz)   SpO2 100%   BMI 16.73 kg/m   Physical Exam  Constitutional: She appears well-developed and well-nourished.  HENT:  Head: Normocephalic and atraumatic.  Dry mucous membranes  Eyes: Pupils are equal, round, and reactive to light. EOM are normal.  Pupils are 2 mm and sluggish.  Neck: Normal  range of motion. Neck supple. No JVD present.  Cardiovascular: Normal rate and regular rhythm.   Pulmonary/Chest: She has wheezes.  Increased respiratory effort. Diminished air movement with wheezing throughout.  Abdominal: Soft. Bowel sounds are normal. There is no tenderness. There is no rebound and no guarding.  Musculoskeletal: Normal range of motion. She exhibits no edema or tenderness.  No lower extremity swelling, asymmetry or tenderness. Distal pulses are 2+.  Neurological:  Patient is drowsy but arousable. Will follow simple commands. Appears to be moving all extremities. Sensation grossly intact.  Skin: Skin is warm and dry. Capillary refill takes  less than 2 seconds. No rash noted. No erythema.  Psychiatric: She has a normal mood and affect. Her behavior is normal.  Nursing note and vitals reviewed.    ED Treatments / Results  Labs (all labs ordered are listed, but only abnormal results are displayed) Labs Reviewed  CBC WITH DIFFERENTIAL/PLATELET - Abnormal; Notable for the following:       Result Value   MCHC 29.1 (*)    Lymphs Abs 0.5 (*)    All other components within normal limits  COMPREHENSIVE METABOLIC PANEL - Abnormal; Notable for the following:    Chloride 92 (*)    CO2 44 (*)    Glucose, Bld 120 (*)    Creatinine, Ser 0.35 (*)    Calcium 10.5 (*)    Total Protein 6.2 (*)    Albumin 3.1 (*)    AST 13 (*)    ALT 13 (*)    Alkaline Phosphatase 136 (*)    Anion gap 4 (*)    All other components within normal limits  RAPID URINE DRUG SCREEN, HOSP PERFORMED - Abnormal; Notable for the following:    Opiates POSITIVE (*)    Benzodiazepines POSITIVE (*)    All other components within normal limits  AMMONIA - Abnormal; Notable for the following:    Ammonia 52 (*)    All other components within normal limits  ACETAMINOPHEN LEVEL - Abnormal; Notable for the following:    Acetaminophen (Tylenol), Serum <10 (*)    All other components within normal limits  BLOOD  GAS, ARTERIAL - Abnormal; Notable for the following:    pH, Arterial 7.196 (*)    pCO2 arterial 115 (*)    pO2, Arterial 128 (*)    Bicarbonate 34.9 (*)    Acid-Base Excess 14.6 (*)    All other components within normal limits  BLOOD GAS, ARTERIAL - Abnormal; Notable for the following:    pH, Arterial 7.315 (*)    pCO2 arterial 82.2 (*)    Bicarbonate 35.8 (*)    Acid-Base Excess 14.0 (*)    All other components within normal limits  MAGNESIUM - Abnormal; Notable for the following:    Magnesium 1.5 (*)    All other components within normal limits  CBC - Abnormal; Notable for the following:    WBC 3.4 (*)    Hemoglobin 10.7 (*)    MCHC 29.4 (*)    All other components within normal limits  COMPREHENSIVE METABOLIC PANEL - Abnormal; Notable for the following:    Chloride 89 (*)    CO2 40 (*)    Glucose, Bld 112 (*)    Total Protein 5.4 (*)    Albumin 2.7 (*)    AST 13 (*)    ALT 11 (*)    All other components within normal limits  PREALBUMIN - Abnormal; Notable for the following:    Prealbumin 9.8 (*)    All other components within normal limits  MAGNESIUM - Abnormal; Notable for the following:    Magnesium 1.3 (*)    All other components within normal limits  CBC - Abnormal; Notable for the following:    Hemoglobin 11.6 (*)    HCT 35.8 (*)    All other components within normal limits  CBC - Abnormal; Notable for the following:    Hemoglobin 11.0 (*)    HCT 34.9 (*)    MCH 25.9 (*)    All other components within normal limits  RESPIRATORY PANEL BY PCR  MRSA  PCR SCREENING  ETHANOL  SALICYLATE LEVEL  TROPONIN I  PROCALCITONIN  LACTIC ACID, PLASMA  LACTIC ACID, PLASMA  PHOSPHORUS  HEMOGLOBIN A1C  PROCALCITONIN  PROCALCITONIN  MAGNESIUM    EKG  EKG Interpretation  Date/Time:  Saturday April 02 2017 16:45:56 EDT Ventricular Rate:  82 PR Interval:    QRS Duration: 99 QT Interval:  442 QTC Calculation: 517 R Axis:   -12 Text Interpretation:  poor baseline .   needs repeat Confirmed by Thomasene Lot, [place] [phone]) on 04/03/2017 8:23:40 PM       Radiology No results found.  Procedures Procedures (including critical care time)  Medications Ordered in ED Medications  pantoprazole (PROTONIX) EC tablet 80 mg (80 mg Oral Given 04/06/17 1431)  ALPRAZolam (XANAX) tablet 0.5 mg (0.5 mg Oral Given 04/06/17 0118)  furosemide (LASIX) tablet 40 mg (40 mg Oral Given 04/06/17 1013)  fluticasone furoate-vilanterol (BREO ELLIPTA) 200-25 MCG/INH 1 puff (1 puff Inhalation Not Given 04/06/17 1101)  donepezil (ARICEPT) tablet 10 mg (10 mg Oral Given 04/05/17 2039)  oxyCODONE-acetaminophen (PERCOCET/ROXICET) 5-325 MG per tablet 1 tablet (not administered)  enoxaparin (LOVENOX) injection 30 mg (30 mg Subcutaneous Given 04/05/17 2039)  ondansetron (ZOFRAN) tablet 4 mg ( Oral See Alternative 04/04/17 0263)    Or  ondansetron (ZOFRAN) injection 4 mg (4 mg Intravenous Given 04/04/17 0638)  FLUoxetine (PROZAC) capsule 40 mg (40 mg Oral Given 04/06/17 1013)  albuterol (PROVENTIL) (2.5 MG/3ML) 0.083% nebulizer solution 2.5 mg (2.5 mg Nebulization Given 04/04/17 0015)  chlorhexidine (PERIDEX) 0.12 % solution 15 mL (15 mLs Mouth Rinse Given 04/06/17 1013)  MEDLINE mouth rinse (15 mLs Mouth Rinse Not Given 04/06/17 1200)  methylPREDNISolone sodium succinate (SOLU-MEDROL) 125 mg/2 mL injection 60 mg (60 mg Intravenous Not Given 04/06/17 1400)  feeding supplement (ENSURE ENLIVE) (ENSURE ENLIVE) liquid 237 mL (237 mLs Oral Given 04/06/17 1400)  levofloxacin (LEVAQUIN) tablet 500 mg (500 mg Oral Given 04/06/17 1013)  ipratropium-albuterol (DUONEB) 0.5-2.5 (3) MG/3ML nebulizer solution 3 mL (3 mLs Nebulization Given 04/06/17 1436)  methylPREDNISolone sodium succinate (SOLU-MEDROL) 125 mg/2 mL injection 125 mg (125 mg Intravenous Given 04/02/17 1717)  sodium chloride 0.9 % bolus 500 mL (0 mLs Intravenous Stopped 04/02/17 1909)  levofloxacin (LEVAQUIN) IVPB 500 mg (0 mg Intravenous Stopped 04/02/17  2140)  iopamidol (ISOVUE-300) 61 % injection 75 mL (75 mLs Intravenous Contrast Given 04/02/17 1946)  dextrose 5 % 100 mL with magnesium sulfate 2 g infusion ( Intravenous Stopped 04/04/17 1050)  magnesium sulfate 2 g in dextrose 5 % 100 mL IVPB (0 g Intravenous Stopped 04/05/17 1100)   CRITICAL CARE Performed by: Lita Mains, Meilah Delrosario Total critical care time: 35 minutes Critical care time was exclusive of separately billable procedures and treating other patients. Critical care was necessary to treat or prevent imminent or life-threatening deterioration. Critical care was time spent personally by me on the following activities: development of treatment plan with patient and/or surrogate as well as nursing, discussions with consultants, evaluation of patient's response to treatment, examination of patient, obtaining history from patient or surrogate, ordering and performing treatments and interventions, ordering and review of laboratory studies, ordering and review of radiographic studies, pulse oximetry and re-evaluation of patient's condition.  Initial Impression / Assessment and Plan / ED Course  I have reviewed the triage vital signs and the nursing notes.  Pertinent labs & imaging results that were available during my care of the patient were reviewed by me and considered in my medical decision making (see chart for details).  Patient placed on BiPAP and given nebulized treatment with steroids. PCO2 was elevated. Discussed with hospitalist who will admit Patient is more alert and interactive after being on BiPAP.  Final Clinical Impressions(s) / ED Diagnoses   Final diagnoses:  Acute on chronic respiratory failure [facility])    New Prescriptions Discharge Medication List as of 04/06/2017 12:07 PM    START taking these medications   Details  predniSONE (DELTASONE) 10 MG tablet Take 1 tablet (10 mg total) by mouth daily with breakfast. Take 6 tablets today and then decrease by 1 tablet daily  until none are left., Starting Wed 04/06/2017, Print         Julianne Rice, MD 04/06/17 2007

## 2017-04-02 NOTE — ED Notes (Signed)
[facility] reports pt recently was discharged from the hospital to another family member's home because the family member that took care of her prior to her hospital admission, were "messing up her medications."  Family member called ems today because pt has been lethargic for the past 24 hours, hasn't eaten in the past 24 hours, but has taken her meds for the day except for ativan because she is out of ativan.  EMS says pt has moments where pt is verbal and will answer questions but easily drifts off to sleep and then is hard to arouse.

## 2017-04-02 NOTE — ED Notes (Signed)
cbg 98 per ems

## 2017-04-02 NOTE — H&P (Signed)
History and Physical    Olivia Randall HWE:[id number] DOB: 1951-01-02 DOA: 04/02/2017  PCP: Octavio Graves, DO Patient coming from: home  Chief Complaint: somnolence  HPI: Olivia Randall is a 66 y.o. female with medical history significant of ancxiety/depression, COPD, GERD, HTN, MS,  CHF, dementia. Patient was discharged from his current hospital on 03/15/2017 after admission for COPD exacerbation requiring intubation. Patient has gone from a skilled nursing facility/rehabilitation facility to different family members. Patient currently presenting to independent hospital from her sister's home who has been her most recent and closest caretaker. Family reports several week history of decreased energy and interest in eating with associated weight loss. Her story status has been at her baseline up until the morning of admission the patient was on her usual home O2 and had no reported fevers, chest pain, shortness of breath, palpitations, nausea, vomiting, abdominal pain, dysuria, frequency. Patient only complaint has been that she has felt a little more sleepy over the last several days. Patient saw her primary care physician one day ago without any respiratory complaints. Per family report on [name] admission she was found to be extremely lethargic and difficult to wake up. There are no focal deficits on their exam area no reported confusion part because patient would not wake up long enough to say whether 1 or 2 words.   ED Course: Objective findings outlined below. Patient post on BiPAP.  Review of Systems: As per HPI otherwise all other systems reviewed and are negative    Past Medical History:  Diagnosis Date  . Anxiety and depression   . Asthma   . CHF (congestive heart failure) ([place])   . Chronic back pain   . Chronic neck pain   . COPD (chronic obstructive pulmonary disease) ([place])   . 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 Dementia   . Depression   . GERD (gastroesophageal  reflux disease)   . Hypertension   . MS (multiple sclerosis) ([place])   . MS (multiple sclerosis) ([place])    staes xrays showed signs of ms  . On home O2    2L N/C  . Shortness of breath dyspnea   . Sleep apnea     Past Surgical History:  Procedure Laterality Date  . ABDOMINAL HYSTERECTOMY    . BIOPSY  04/24/2015   Procedure: BIOPSY (Gastric);  Surgeon: [name], MD;  Location: [facility];  Service: Endoscopy;;  . BLADDER SURGERY    . BREAST SURGERY    . CARPAL TUNNEL RELEASE    . CHOLECYSTECTOMY    . ESOPHAGOGASTRODUODENOSCOPY  2009   Dr. Laural Golden: small sliding hiatal hernia with mild reflux esophagitis, empiric dilation with 54/56 F, nonerosive antral gastritis   . ESOPHAGOGASTRODUODENOSCOPY (EGD) WITH PROPOFOL N/A 04/24/2015   RMR: normal esophagus status post Maloney dilation. 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   [name] HEMORRHOID SURGERY    . MALONEY DILATION N/A 04/24/2015   Procedure: MALONEY ESOPHAGEAL DILATION (54FR);  Surgeon: [name], MD;  Location: [facility];  Service: Endoscopy;  Laterality: N/A;  . NECK SURGERY    . SHOULDER SURGERY Right     Social History   Social History  . Marital status: Married    Spouse name: N/A  . Number of children: N/A  . Years of education: N/A   Occupational History  . Not on file.   Social History Main Topics  . Smoking status: Current Every Day Smoker  Types: Cigarettes  . Smokeless tobacco: Never Used     Comment: 3-4 cigarettes a day  . Alcohol use No  . Drug use: No  . Sexual activity: No   Other Topics Concern  . Not on file   Social History Narrative  . No narrative on file    Allergies  Allergen Reactions  . Clopidogrel Other (See Comments)    Confusion and behavioral changes (altered)    Family History  Problem Relation Age of Onset  . Diabetes Mother   . Diabetes Father   . Diabetes Brother   . Colon cancer Neg Hx       Prior to Admission medications     Medication Sig Start Date End Date Taking? Authorizing Provider  ALPRAZolam Duanne Moron) 0.5 MG tablet Take 1 tablet (0.5 mg total) by mouth 2 (two) times daily as needed for anxiety. 04/01/17  Yes Terald Sleeper, PA-C  oxyCODONE-acetaminophen (ROXICET) 5-325 MG tablet Take 1 tablet by mouth every 8 (eight) hours as needed for severe pain. Chronic pain 04/01/17  Yes Terald Sleeper, PA-C  acetaminophen (TYLENOL) 325 MG tablet Take 2 tablets (650 mg total) by mouth every 6 (six) hours as needed for mild pain, moderate pain, fever or headache (or Fever >/= 101). 01/27/17   Hongalgi, Lenis Dickinson, MD  donepezil (ARICEPT) 10 MG tablet Take 10 mg by mouth at bedtime.     [provider]  esomeprazole (NEXIUM) 40 MG capsule Take 1 capsule (40 mg total) by mouth daily. 04/01/17   Terald Sleeper, PA-C  FLUoxetine (PROZAC) 40 MG capsule Take 1-2 capsules (40-80 mg total) by mouth daily. 04/01/17   Terald Sleeper, PA-C  fluticasone furoate-vilanterol (BREO ELLIPTA) 200-25 MCG/INH AEPB Inhale 1 puff into the lungs daily. 04/01/17   Terald Sleeper, PA-C  furosemide (LASIX) 40 MG tablet Take 1 tablet (40 mg total) by mouth daily. 04/01/17   Terald Sleeper, PA-C  ipratropium-albuterol (DUONEB) 0.5-2.5 (3) MG/3ML SOLN Take 3 mLs by nebulization 3 (three) times daily. 04/01/17   Terald Sleeper, PA-C    Physical Exam: Vitals:   04/02/17 1730 04/02/17 1800 04/02/17 1801 04/02/17 1830  BP: (!) 168/74 (!) 167/77 (!) 167/77 (!) 145/66  Pulse: 81 83 80 73  Resp: (!) 24 (!) 24 (!) 25 20  SpO2: 98% 93% 95% 96%     General: Sleeping in bed on BiPAP. Frail cachectic appearing Eyes:  PERRL, EOMI, normal lids, iris ENT: Poor dentition, dry mucous membranes Neck:  no LAD, masses or thyromegaly Cardiovascular: Difficult to appreciate cardiac sounds due to patient being on BiPAP. Regular rate and rhythm, no lower extremity edema Respiratory: On BiPAP, diffuse rhonchi and wheezes Abdomen:  soft, ntnd, NABS Skin:  no rash or  induration seen on limited exam Musculoskeletal:  Will move limbs to painful stimuli and coordinated fashion, and a bony abnormalities, thin. Psychiatric:  Patient only minimally responsive but does attempt to follow commands. Neurologic:  Unable to fully assess. No facial asymmetry. Moves all extremity.   Labs on Admission: I have personally reviewed following labs and imaging studies  CBC:  Recent Labs Lab 04/02/17 1658  WBC 7.2  NEUTROABS 6.3  HGB 12.0  HCT 41.3  MCV 92.4  PLT 761   Basic Metabolic Panel:  Recent Labs Lab 04/02/17 1658  NA 140  K 4.6  CL 92*  CO2 44*  GLUCOSE 120*  BUN 7  CREATININE 0.35*  CALCIUM 10.5*   GFR: Estimated Creatinine  Clearance: 45.9 mL/min (A) (by C-G formula based on SCr of 0.35 mg/dL (L)). Liver Function Tests:  Recent Labs Lab 04/02/17 1658  AST 13*  ALT 13*  ALKPHOS 136*  BILITOT 0.6  PROT 6.2*  ALBUMIN 3.1*   No results for input(s): LIPASE, AMYLASE in the last 168 hours.  Recent Labs Lab 04/02/17 1658  AMMONIA 52*   Coagulation Profile: No results for input(s): INR, PROTIME in the last 168 hours. Cardiac Enzymes:  Recent Labs Lab 04/02/17 1658  TROPONINI <0.03   BNP (last 3 results) No results for input(s): PROBNP in the last 8760 hours. HbA1C: No results for input(s): HGBA1C in the last 72 hours. CBG: No results for input(s): GLUCAP in the last 168 hours. Lipid Profile: No results for input(s): CHOL, HDL, LDLCALC, TRIG, CHOLHDL, LDLDIRECT in the last 72 hours. Thyroid Function Tests: No results for input(s): TSH, T4TOTAL, FREET4, T3FREE, THYROIDAB in the last 72 hours. Anemia Panel: No results for input(s): VITAMINB12, FOLATE, FERRITIN, TIBC, IRON, RETICCTPCT in the last 72 hours. Urine analysis:    Component Value Date/Time   COLORURINE YELLOW 01/24/2017 2340   APPEARANCEUR CLEAR 01/24/2017 2340   LABSPEC 1.011 01/24/2017 2340   PHURINE 6.0 01/24/2017 2340   GLUCOSEU NEGATIVE 01/24/2017 2340    HGBUR NEGATIVE 01/24/2017 2340   BILIRUBINUR NEGATIVE 01/24/2017 2340   KETONESUR NEGATIVE 01/24/2017 2340   PROTEINUR NEGATIVE 01/24/2017 2340   UROBILINOGEN 0.2 03/18/2015 0630   NITRITE NEGATIVE 01/24/2017 2340   LEUKOCYTESUR NEGATIVE 01/24/2017 2340    Creatinine Clearance: Estimated Creatinine Clearance: 45.9 mL/min (A) (by C-G formula based on SCr of 0.35 mg/dL (L)).  Sepsis Labs: @LABRCNTIP (procalcitonin:4,lacticidven:4) )No results found for this or any previous visit (from the past 240 hour(s)).   Radiological Exams on Admission: Dg Chest Port 1 View  Result Date: 04/02/2017 CLINICAL DATA:  Shortness of breath EXAM: PORTABLE CHEST 1 VIEW COMPARISON:  Multiple priors, most recently 03/13/2017 FINDINGS: Blunting of the right costophrenic angle, possibly chronic pleural thickening versus a small right pleural effusion. Increased interstitial markings without frank interstitial edema. Left lung is clear. No pneumothorax. Heart is normal in size. Cervical spine fixation hardware. IMPRESSION: Chronic blunting of the right costophrenic angle, possibly pleural thickening versus a small pleural effusion. No evidence of acute cardiopulmonary disease. Electronically Signed   By: Julian Hy M.D.   On: 04/02/2017 17:32    EKG: Independently reviewed. Artifact. No definitive ACS. Sinus.  Assessment/Plan Active Problems:   Chronic back pain   Tobacco use disorder   Anxiety and depression   COPD exacerbation (HCC)   Acute on chronic respiratory failure (HCC)   Severe protein-calorie malnutrition [name]: less than 60% of standard weight) ([place])   Acute on chronic respiratory failure: likely from COPD exacerbation. ABG showing pH 7.19, PCO2 1:15, PaO2 128, bicarbonate 34.9. Per report by EDP patient doing significantly better after being placed on BiPAP and not in need of emergent intubation. After additional discussions with patient's family and caretakers she is a DO NOT INTUBATE. I  suspect patient is approaching end of life and may need palliative care for goals of care discussions prior to discharge. Patient may need to be made hospice as well. Family states that patient was recently admitted to [facility] was diagnosed with lung cancer told showing a couple months to live. There is no record of this in Epic or through care everywhere. - Continue BiPAP - DuoNeb every 4 - Solu-Medrol 60 mg every 8 hours - Continue BIPAP -  Repeat ABG at 22:00  - If awake and alert may come off for meds - CT chest with contrast - Levaquin - Respiratory viral panel, pro-calcitonin  Chronic diastolic congestive heart failure: Last echo showing EF 60% and grade 1 diastolic disruption. No evidence of acute decompensation - continue lasix - strict I/O, daily wts  Severe protein calorie malnutrition: Patient weighs 94 pounds. Very low oral intake over the last several weeks. - PT/OT/nutrition consult, pre-albumin  Hyperglycemia: - A1c  Tobacco use: 1-2 per day  Chronic pain: - continue percocet  Depression/nxiety: - continue prozac, xanax  Dementia: - continue aricept  GERD: - continue PPI   DVT prophylaxis: Lovenox  Code Status: DNR  Family Communication: sisters  Disposition Plan: pending improvement   Consults called: none  Admission status: inpt    Alanmichael Barmore J MD [facility]  If 7PM-7AM, please contact night-coverage [email or web address] Password [facility]  04/02/2017, 7:11 PM     Critical Care Time This patient is critically ill requiring frequent monitoring and support due to their life/organ threatening condition. This care involves a high complexity of medical decision making. Greater than 45 minutes spent in direct patient care and coordination.

## 2017-04-02 NOTE — ED Notes (Signed)
Attempted in and out on patient. No urine.  MD made aware. Verbal order for 500 cc bolus and will attempt in and out again.

## 2017-04-02 NOTE — ED Triage Notes (Signed)
Per ems, bp 160/72 , hr 82, 98% on 2 liters, temp 96.1 oral and axillary.  Pt on home 02 at [facility] continuously.

## 2017-04-02 NOTE — Progress Notes (Signed)
Pharmacy Antibiotic Note  Olivia Randall is a 66 y.o. female admitted on 04/02/2017 with COPD exacerbation.  Pharmacy has been consulted for levaquin dosing. Levaquin 500 mg IV X 1 given  Plan: Cont levaquin 500 mg IV q24 hours F/u renal function, cultures and clinical coure  Height: 5\' 4"  (162.6 cm) Weight: 95 lb 14.4 oz (43.5 kg) IBW/kg (Calculated) : 54.7  Temp (24hrs), Avg:97.5 F (36.4 C), Min:97.5 F (36.4 C), Max:97.5 F (36.4 C)   Recent Labs Lab 04/02/17 1658 04/02/17 1909  WBC 7.2  --   CREATININE 0.35*  --   LATICACIDVEN  --  0.8    Estimated Creatinine Clearance: 46.9 mL/min (A) (by C-G formula based on SCr of 0.35 mg/dL (L)).    Allergies  Allergen Reactions  . Clopidogrel Other (See Comments)    Confusion and behavioral changes (altered)     Thank you for allowing pharmacy to be a part of this patient's care.  Beverlee Nims 04/02/2017 8:13 PM

## 2017-04-03 ENCOUNTER — Encounter (HOSPITAL_COMMUNITY): Payer: Self-pay

## 2017-04-03 LAB — COMPREHENSIVE METABOLIC PANEL
ALT: 11 U/L — AB (ref 14–54)
AST: 13 U/L — AB (ref 15–41)
Albumin: 2.7 g/dL — ABNORMAL LOW (ref 3.5–5.0)
Alkaline Phosphatase: 113 U/L (ref 38–126)
Anion gap: 7 (ref 5–15)
BILIRUBIN TOTAL: 0.7 mg/dL (ref 0.3–1.2)
BUN: 10 mg/dL (ref 6–20)
CALCIUM: 10 mg/dL (ref 8.9–10.3)
CO2: 40 mmol/L — ABNORMAL HIGH (ref 22–32)
CREATININE: 0.45 mg/dL (ref 0.44–1.00)
Chloride: 89 mmol/L — ABNORMAL LOW (ref 101–111)
Glucose, Bld: 112 mg/dL — ABNORMAL HIGH (ref 65–99)
Potassium: 4.4 mmol/L (ref 3.5–5.1)
Sodium: 136 mmol/L (ref 135–145)
TOTAL PROTEIN: 5.4 g/dL — AB (ref 6.5–8.1)

## 2017-04-03 LAB — RESPIRATORY PANEL BY PCR
ADENOVIRUS-RVPPCR: NOT DETECTED
Bordetella pertussis: NOT DETECTED
CHLAMYDOPHILA PNEUMONIAE-RVPPCR: NOT DETECTED
CORONAVIRUS 229E-RVPPCR: NOT DETECTED
CORONAVIRUS HKU1-RVPPCR: NOT DETECTED
CORONAVIRUS NL63-RVPPCR: NOT DETECTED
CORONAVIRUS OC43-RVPPCR: NOT DETECTED
Influenza A: NOT DETECTED
Influenza B: NOT DETECTED
MYCOPLASMA PNEUMONIAE-RVPPCR: NOT DETECTED
Metapneumovirus: NOT DETECTED
PARAINFLUENZA VIRUS 1-RVPPCR: NOT DETECTED
PARAINFLUENZA VIRUS 4-RVPPCR: NOT DETECTED
Parainfluenza Virus 2: NOT DETECTED
Parainfluenza Virus 3: NOT DETECTED
Respiratory Syncytial Virus: NOT DETECTED
Rhinovirus / Enterovirus: NOT DETECTED

## 2017-04-03 LAB — CBC
HEMATOCRIT: 36.4 % (ref 36.0–46.0)
Hemoglobin: 10.7 g/dL — ABNORMAL LOW (ref 12.0–15.0)
MCH: 26.6 pg (ref 26.0–34.0)
MCHC: 29.4 g/dL — ABNORMAL LOW (ref 30.0–36.0)
MCV: 90.5 fL (ref 78.0–100.0)
PLATELETS: 218 10*3/uL (ref 150–400)
RBC: 4.02 MIL/uL (ref 3.87–5.11)
RDW: 13.8 % (ref 11.5–15.5)
WBC: 3.4 10*3/uL — AB (ref 4.0–10.5)

## 2017-04-03 LAB — RAPID URINE DRUG SCREEN, HOSP PERFORMED
AMPHETAMINES: NOT DETECTED
BARBITURATES: NOT DETECTED
Benzodiazepines: POSITIVE — AB
Cocaine: NOT DETECTED
OPIATES: POSITIVE — AB
TETRAHYDROCANNABINOL: NOT DETECTED

## 2017-04-03 LAB — PROCALCITONIN

## 2017-04-03 MED ORDER — ORAL CARE MOUTH RINSE
15.0000 mL | Freq: Two times a day (BID) | OROMUCOSAL | Status: DC
  Administered 2017-04-03 – 2017-04-05 (×3): 15 mL via OROMUCOSAL

## 2017-04-03 MED ORDER — SODIUM CHLORIDE 0.45 % IV SOLN
INTRAVENOUS | Status: DC
  Administered 2017-04-03: 16:00:00 via INTRAVENOUS

## 2017-04-03 MED ORDER — IPRATROPIUM-ALBUTEROL 0.5-2.5 (3) MG/3ML IN SOLN
3.0000 mL | Freq: Four times a day (QID) | RESPIRATORY_TRACT | Status: DC
  Administered 2017-04-03 – 2017-04-06 (×12): 3 mL via RESPIRATORY_TRACT
  Filled 2017-04-03 (×12): qty 3

## 2017-04-03 MED ORDER — SODIUM CHLORIDE 0.45 % IV SOLN
INTRAVENOUS | Status: DC
  Administered 2017-04-03: 10:00:00 via INTRAVENOUS

## 2017-04-03 MED ORDER — CHLORHEXIDINE GLUCONATE 0.12 % MT SOLN
15.0000 mL | Freq: Two times a day (BID) | OROMUCOSAL | Status: DC
  Administered 2017-04-03 – 2017-04-06 (×7): 15 mL via OROMUCOSAL
  Filled 2017-04-03 (×7): qty 15

## 2017-04-03 MED ORDER — ALBUTEROL SULFATE (2.5 MG/3ML) 0.083% IN NEBU
2.5000 mg | INHALATION_SOLUTION | RESPIRATORY_TRACT | Status: DC | PRN
Start: 2017-04-03 — End: 2017-04-06
  Administered 2017-04-04: 2.5 mg via RESPIRATORY_TRACT
  Filled 2017-04-03: qty 3

## 2017-04-03 NOTE — Progress Notes (Signed)
[facility] PROGRESS NOTE  Olivia Randall YPP:[id number] DOB: 09-15-1950 DOA: 04/02/2017 PCP: Octavio Graves, DO  Brief summary   66 y.o. female with medical history significant of ancxiety/depression, COPD, GERD, HTN, MS,  CHF, dementia. Patient was discharged from his current hospital on 03/15/2017 after admission for COPD exacerbation requiring intubation. Patient has gone from a skilled nursing facility/rehabilitation facility to different family members. Patient currently presenting to independent hospital from her sister's home who has been her most recent and closest caretaker. Family reports several week history of decreased energy and interest in eating with associated weight loss. Her story status has been at her baseline up until the morning of admission the patient was on her usual home O2 and had no reported fevers, chest pain, shortness of breath, palpitations, nausea, vomiting, abdominal pain, dysuria, frequency. Patient only complaint has been that she has felt a little more sleepy over the last several days. Patient saw her primary care physician one day ago without any respiratory complaints. Per family report on [name] admission she was found to be extremely lethargic and difficult to wake up. There are no focal deficits on their exam area no reported confusion part because patient would not wake up long enough to say whether 1 or 2 words.   ED Course: Objective findings outlined below. Patient post on BiPAP.  Assessment/Plan:  Acute on chronic respiratory failure. COPD exacerbation. ABG showing pH 7.19, PCO2 1:15, PaO2 128, bicarbonate 34.9.  -ABG improved. will cont BiPAP prn, cont bronchodilators, steroids, Levaquin, pend Respiratory viral panel   Suspect metastatic lung cancer, end stage copd, will consult palliative care for goals of care discussions prior to discharge. Patient may need to be made hospice as well. Family stated that patient was recently admitted to  [facility] was diagnosed with lung cancer told showing a couple months to live.  -CT scan was obtain and showed Grossly stable spiculated mass in the left upper lobe measuring 2.4 cm concerning for malignancy. small pulmonary nodules in the posterior left lower lobe concerning for metastatic nodules. Interim finding of a 2.9 cm low density mass in the left hepatic lobe, concerning for metastatic disease. Interval finding of lytic and sclerotic lesions within the T11, T12and L2 vertebral bodies, also concerning for metastatic disease.   Chronic diastolic congestive heart failure: Last echo showing EF 60% and grade 1 diastolic disruption. No evidence of acute decompensation - continue lasix. strict I/O, daily wts  Severe protein calorie malnutrition: Patient weighs 94 pounds. Very low oral intake over the last several weeks. - PT/OT/nutrition consult, pre-albumin  Hyperglycemia, ? Steroid induce. pend A1c  Tobacco use: 1-2 per day  Chronic pain: continue percocet  Depression/anxiety:  continue prozac, xanax  Dementia: continue aricept  GERD: continue PPI   Code Status: DNR Family Communication: d/w patient, RN. No family at the bedside  (indicate person spoken with, relationship, and if by phone, the number) Disposition Plan: pend clinical improvement    Consultants:  Palliative care  Procedures:  BiPAP  Antibiotics: Anti-infectives    Start     Dose/Rate Route Frequency Ordered Stop   04/03/17 2000  levofloxacin (LEVAQUIN) IVPB 500 mg     500 mg 100 mL/hr over 60 Minutes Intravenous Every 24 hours 04/02/17 2013     04/02/17 2000  levofloxacin (LEVAQUIN) IVPB 500 mg     500 mg 100 mL/hr over 60 Minutes Intravenous  Once 04/02/17 1916 04/02/17 2140        (indicate start date, and  stop date if known)  HPI/Subjective: Required BiPAP. Clinically much improved on BiPAP. Denies acute pains.   Objective: Vitals:   04/03/17 0400 04/03/17 0728  BP: 140/72    Pulse: 83 95  Resp: 13 (!) 22  Temp: (!) 97.5 F (36.4 C) 98.4 F (36.9 C)    Intake/Output Summary (Last 24 hours) at 04/03/17 0832 Last data filed at 04/03/17 0500  Gross per 24 hour  Intake           369.17 ml  Output              200 ml  Net           169.17 ml   Filed Weights   04/02/17 2000 04/03/17 0500  Weight: 43.5 kg (95 lb 14.4 oz) 44.3 kg (97 lb 10.6 oz)    Exam:   General:  Alert, oriented to person, place  Cardiovascular: s1,s2 rrr  Respiratory: diminished BL  Abdomen: soft, nt   Musculoskeletal: no leg edema   Data Reviewed: Basic Metabolic Panel:  Recent Labs Lab 04/02/17 1658 04/02/17 1910 04/03/17 0541  NA 140  --  136  K 4.6  --  4.4  CL 92*  --  89*  CO2 44*  --  40*  GLUCOSE 120*  --  112*  BUN 7  --  10  CREATININE 0.35*  --  0.45  CALCIUM 10.5*  --  10.0  MG  --  1.5*  --   PHOS  --  3.6  --    Liver Function Tests:  Recent Labs Lab 04/02/17 1658 04/03/17 0541  AST 13* 13*  ALT 13* 11*  ALKPHOS 136* 113  BILITOT 0.6 0.7  PROT 6.2* 5.4*  ALBUMIN 3.1* 2.7*   No results for input(s): LIPASE, AMYLASE in the last 168 hours.  Recent Labs Lab 04/02/17 1658  AMMONIA 52*   CBC:  Recent Labs Lab 04/02/17 1658 04/03/17 0541  WBC 7.2 3.4*  NEUTROABS 6.3  --   HGB 12.0 10.7*  HCT 41.3 36.4  MCV 92.4 90.5  PLT 227 218   Cardiac Enzymes:  Recent Labs Lab 04/02/17 1658  TROPONINI <0.03   BNP (last 3 results)  Recent Labs  09/05/16 0033 10/26/16 0526  BNP 36.0 11.0    ProBNP (last 3 results) No results for input(s): PROBNP in the last 8760 hours.  CBG: No results for input(s): GLUCAP in the last 168 hours.  Recent Results (from the past 240 hour(s))  MRSA PCR Screening     Status: None   Collection Time: 04/02/17  8:00 PM  Result Value Ref Range Status   MRSA by PCR NEGATIVE NEGATIVE Final    Comment:        The GeneXpert MRSA Assay (FDA approved for NASAL specimens only), is one component of  a comprehensive MRSA colonization surveillance program. It is not intended to diagnose MRSA infection nor to guide or monitor treatment for MRSA infections.      Studies: Ct Chest W Contrast  Result Date: 04/02/2017 CLINICAL DATA:  Decreased energy, weight loss, fever, chest pain, shortness of breath EXAM: CT CHEST WITH CONTRAST TECHNIQUE: Multidetector CT imaging of the chest was performed during intravenous contrast administration. CONTRAST:  45mL ISOVUE-300 IOPAMIDOL (ISOVUE-300) INJECTION 61% COMPARISON:  04/02/2017, CT chest 12/05/2016, 01/17/2013 FINDINGS: Cardiovascular: Atherosclerotic calcifications within the aorta. No aneurysmal dilatation. Normal heart size. No significant pericardial effusion. Mediastinum/Nodes: Mediastinal adenopathy re- demonstrated with 14 mm AP window lymph node as before.  Increased size of a lymph node adjacent to the great vessels, measuring 11 mm. Subcentimeter hypodense right thyroid nodule. Midline trachea. Air distention of the esophagus. Otherwise unremarkable. Lungs/Pleura: Moderate emphysema. Spiculated left upper lobe lung nodule measuring 2.4 x 1.5 by 1.9 cm, not significantly changed in size. Development of additional pulmonary nodules within the posterior left lower lobe measuring 8 mm and 5 mm. Bronchiectasis and scarring in the medial right lung base. No pleural effusion. Mild tree-in-bud pattern right middle lobe unchanged. Upper Abdomen: Interval finding of a low-density mass measuring approximately 2.9 cm within the left hepatic lobe. Surgical clips in the gallbladder fossa. subcentimeter hypodensity left kidney, too small to further characterize. Adrenal glands are within normal limits. Musculoskeletal: Development of large lucent lesion in L2 vertebral body. Interim finding of mixed lucent and sclerotic lesion in T12 and T11. Mild wedging of T7 unchanged. Degenerative changes elsewhere in the spine. Partially visualized surgical plate and screw  fixation of the lower cervical spine. IMPRESSION: 1. Grossly stable spiculated mass in the left upper lobe measuring 2.4 cm concerning for malignancy. Development of small pulmonary nodules in the posterior left lower lobe concerning for metastatic nodules. Slight increased adenopathy in the mediastinum since the prior CT. PET-CT previously recommended for further evaluation. 2. Interim finding of a 2.9 cm low density mass in the left hepatic lobe, concerning for metastatic disease. 3. Interval finding of lytic and sclerotic lesions within the T11, T12 and L2 vertebral bodies, also concerning for metastatic disease. 4. Moderate emphysema Aortic Atherosclerosis (ICD10-I70.0) and Emphysema (ICD10-J43.9). Electronically Signed   By: Donavan Foil M.D.   On: 04/02/2017 20:18   Dg Chest Port 1 View  Result Date: 04/02/2017 CLINICAL DATA:  Shortness of breath EXAM: PORTABLE CHEST 1 VIEW COMPARISON:  Multiple priors, most recently 03/13/2017 FINDINGS: Blunting of the right costophrenic angle, possibly chronic pleural thickening versus a small right pleural effusion. Increased interstitial markings without frank interstitial edema. Left lung is clear. No pneumothorax. Heart is normal in size. Cervical spine fixation hardware. IMPRESSION: Chronic blunting of the right costophrenic angle, possibly pleural thickening versus a small pleural effusion. No evidence of acute cardiopulmonary disease. Electronically Signed   By: Julian Hy M.D.   On: 04/02/2017 17:32    Scheduled Meds: . chlorhexidine  15 mL Mouth Rinse BID  . donepezil  10 mg Oral QHS  . enoxaparin (LOVENOX) injection  30 mg Subcutaneous Q24H  . FLUoxetine  40 mg Oral Daily  . fluticasone furoate-vilanterol  1 puff Inhalation Daily  . furosemide  40 mg Oral Daily  . ipratropium-albuterol  3 mL Nebulization Q6H  . mouth rinse  15 mL Mouth Rinse q12n4p  . methylPREDNISolone (SOLU-MEDROL) injection  60 mg Intravenous Q8H  . pantoprazole  80 mg  Oral Q1200   Continuous Infusions: . sodium chloride 50 mL/hr at 04/02/17 2037  . levofloxacin (LEVAQUIN) IV      Active Problems:   Chronic back pain   Tobacco use disorder   Anxiety and depression   COPD exacerbation (HCC)   Acute on chronic respiratory failure ([place])   Severe protein-calorie malnutrition [name]: less than 60% of standard weight) ([place])    Time spent: > 35 minutes     Kinnie Feil  [facility] Pager [phone]. If 7PM-7AM, please contact night-coverage at [email or web address], password [facility] 04/03/2017, 8:32 AM  LOS: 1 day

## 2017-04-03 NOTE — Progress Notes (Signed)
CRITICAL VALUE ALERT  Critical Value:  CO2 82.2 Date & Time Notied:  04/02/17   2230 Provider Notified: Marily Memos  Orders Received/Actions taken: continue with current treatment

## 2017-04-03 NOTE — Progress Notes (Signed)
RT took patient off of the bipap within the last half hour, and placed her on [place]. The patient started having labored respirations with accessory muscle use, RR 25-30, HR 105-115. Bipap placed back on patient at this time. Informed RT and MD

## 2017-04-03 NOTE — Plan of Care (Signed)
Problem: Tissue Perfusion: Goal: Risk factors for ineffective tissue perfusion will decrease Outcome: Progressing Pt continues on lovenox for DVT prevention

## 2017-04-04 ENCOUNTER — Encounter (HOSPITAL_COMMUNITY): Payer: Self-pay | Admitting: Primary Care

## 2017-04-04 DIAGNOSIS — Z7189 Other specified counseling: Secondary | ICD-10-CM

## 2017-04-04 DIAGNOSIS — Z515 Encounter for palliative care: Secondary | ICD-10-CM

## 2017-04-04 LAB — MAGNESIUM: Magnesium: 1.3 mg/dL — ABNORMAL LOW (ref 1.7–2.4)

## 2017-04-04 LAB — PROCALCITONIN: Procalcitonin: <0.1 ng/mL

## 2017-04-04 LAB — HEMOGLOBIN A1C
Hgb A1c MFr Bld: 4.8 % (ref 4.8–5.6)
Mean Plasma Glucose: 91 mg/dL

## 2017-04-04 MED ORDER — DEXTROSE 5 % IV SOLN
Freq: Once | INTRAVENOUS | Status: AC
  Administered 2017-04-04: 10:00:00 via INTRAVENOUS
  Filled 2017-04-04: qty 100

## 2017-04-04 MED ORDER — MAGNESIUM SULFATE 2 GM/50ML IV SOLN: 2.0000 g | Freq: Once | INTRAVENOUS | Status: DC

## 2017-04-04 MED ORDER — METHYLPREDNISOLONE SODIUM SUCC 125 MG IJ SOLR
60.0000 mg | Freq: Two times a day (BID) | INTRAMUSCULAR | Status: DC
  Administered 2017-04-04 – 2017-04-06 (×4): 60 mg via INTRAVENOUS
  Filled 2017-04-04 (×5): qty 2

## 2017-04-04 MED ORDER — LEVOFLOXACIN 500 MG PO TABS
500.0000 mg | ORAL_TABLET | Freq: Every day | ORAL | Status: DC
  Administered 2017-04-04: 500 mg via ORAL
  Filled 2017-04-04: qty 1

## 2017-04-04 MED ORDER — ENSURE ENLIVE PO LIQD
237.0000 mL | Freq: Two times a day (BID) | ORAL | Status: DC
  Administered 2017-04-05 – 2017-04-06 (×4): 237 mL via ORAL

## 2017-04-04 NOTE — Progress Notes (Signed)
BP (!) 148/80   Pulse 93   Temp 97.7 F (36.5 C) (Oral)   Ht 5\' 3"  (1.6 m)   Wt 94 lb (42.6 kg)   SpO2 92% Comment: on 1.5 Liter of O2  BMI 16.65 kg/m    Subjective:    Patient ID: Olivia Randall, female    DOB: Jun 24, 1951, 66 y.o.   MRN: [id number]  HPI: Olivia Randall is a 66 y.o. female presenting on 04/01/2017 for No chief complaint on file.  This is a new patient to our office. I have seen this patient over the years. She had not come to this office and therefore have not seen her in over a year. She has gone through the loss of her husband. She herself has severe COPD, GERD anxiety and depression. And multiple other medical problems. Her grandchildren live in her home and have created a very unsafe environment. They are not taking very good care of her. She is here today with her sister. She is can be staying at her home now. They're looking to have her placed in a senior living apartment complex. I do not know that she is strong enough to stay completely on her own at this point. We are going to plan a home health referral. Patient has had significant weight loss. This is related to the grandchildren not taking care of her. Also she is quite depressed. Adult Protective Services have been called.  Relevant past medical, surgical, family and social history reviewed and updated as indicated. Allergies and medications reviewed and updated.  Past Medical History:  Diagnosis Date  . Anxiety and depression   . Asthma   . CHF (congestive heart failure) ([place])   . Chronic back pain   . Chronic neck pain   . COPD (chronic obstructive pulmonary disease) ([place])   . 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 Dementia   . Depression   . GERD (gastroesophageal reflux disease)   . Hypertension   . MS (multiple sclerosis) ([place])   . MS (multiple sclerosis) ([place])    staes xrays showed signs of ms  . On home O2    2L N/C  . Shortness of breath dyspnea   . Sleep apnea     Past  Surgical History:  Procedure Laterality Date  . ABDOMINAL HYSTERECTOMY    . BIOPSY  04/24/2015   Procedure: BIOPSY (Gastric);  Surgeon: [name], MD;  Location: [facility];  Service: Endoscopy;;  . BLADDER SURGERY    . BREAST SURGERY    . CARPAL TUNNEL RELEASE    . CHOLECYSTECTOMY    . ESOPHAGOGASTRODUODENOSCOPY  2009   Dr. Laural Golden: small sliding hiatal hernia with mild reflux esophagitis, empiric dilation with 54/56 F, nonerosive antral gastritis   . ESOPHAGOGASTRODUODENOSCOPY (EGD) WITH PROPOFOL N/A 04/24/2015   RMR: normal esophagus status post Maloney dilation. 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   [name] HEMORRHOID SURGERY    . MALONEY DILATION N/A 04/24/2015   Procedure: MALONEY ESOPHAGEAL DILATION (54FR);  Surgeon: [name], MD;  Location: [facility];  Service: Endoscopy;  Laterality: N/A;  . NECK SURGERY    . SHOULDER SURGERY Right     Review of Systems  Constitutional: Positive for fatigue and unexpected weight change.  HENT: Positive for congestion.   Eyes: Negative.   Respiratory: Positive for cough, shortness of breath and wheezing.   Cardiovascular: Negative for chest pain, palpitations and leg swelling.  Gastrointestinal: Negative.   Genitourinary: Negative.   Musculoskeletal: Positive for arthralgias.  Neurological: Negative.     Allergies as of 04/01/2017      Reactions   Clopidogrel Other (See Comments)   Confusion and behavioral changes (altered)      Medication List       Accurate as of 04/01/17 11:59 PM. Always use your most recent med list.          acetaminophen 325 MG tablet Commonly known as:  TYLENOL Take 2 tablets (650 mg total) by mouth every 6 (six) hours as needed for mild pain, moderate pain, fever or headache (or Fever >/= 101).   ALPRAZolam 0.5 MG tablet Commonly known as:  XANAX Take 1 tablet (0.5 mg total) by mouth 2 (two) times daily as needed for anxiety.   donepezil 10 MG tablet Commonly known  as:  ARICEPT Take 10 mg by mouth at bedtime.   esomeprazole 40 MG capsule Commonly known as:  NEXIUM Take 1 capsule (40 mg total) by mouth daily.   FLUoxetine 40 MG capsule Commonly known as:  PROZAC Take 1-2 capsules (40-80 mg total) by mouth daily.   fluticasone furoate-vilanterol 200-25 MCG/INH Aepb Commonly known as:  BREO ELLIPTA Inhale 1 puff into the lungs daily.   furosemide 40 MG tablet Commonly known as:  LASIX Take 1 tablet (40 mg total) by mouth daily.   ipratropium-albuterol 0.5-2.5 (3) MG/3ML Soln Commonly known as:  DUONEB Take 3 mLs by nebulization 3 (three) times daily.   oxyCODONE-acetaminophen 5-325 MG tablet Commonly known as:  ROXICET Take 1 tablet by mouth every 8 (eight) hours as needed for severe pain. Chronic pain          Objective:    BP (!) 148/80   Pulse 93   Temp 97.7 F (36.5 C) (Oral)   Ht 5\' 3"  (1.6 m)   Wt 94 lb (42.6 kg)   SpO2 92% Comment: on 1.5 Liter of O2  BMI 16.65 kg/m   Allergies  Allergen Reactions  . Clopidogrel Other (See Comments)    Confusion and behavioral changes (altered)    Physical Exam  Constitutional: She is oriented to person, place, and time. She appears well-developed. She appears cachectic. She has a sickly appearance.  HENT:  Head: Normocephalic and atraumatic.  Right Ear: Tympanic membrane, external ear and ear canal normal.  Left Ear: Tympanic membrane, external ear and ear canal normal.  Nose: Nose normal. No rhinorrhea.  Mouth/Throat: Oropharynx is clear and moist and mucous membranes are normal. No oropharyngeal exudate or posterior oropharyngeal erythema.  Eyes: Pupils are equal, round, and reactive to light. Conjunctivae and EOM are normal.  Neck: Normal range of motion. Neck supple.  Cardiovascular: Normal rate, regular rhythm, S1 normal, S2 normal, normal heart sounds and intact distal pulses.   Pulmonary/Chest: Effort normal. She has no decreased breath sounds. She has wheezes in the right  upper field and the left upper field.  Abdominal: Soft. Bowel sounds are normal.  Neurological: She is alert and oriented to person, place, and time. She has normal reflexes.  Skin: Skin is warm and dry. No rash noted.  Psychiatric: Her behavior is normal. Judgment and thought content normal. She exhibits a depressed mood.    Results for orders placed or performed during the hospital encounter of 03/10/17  MRSA PCR Screening  Result Value Ref Range   MRSA by PCR NEGATIVE NEGATIVE  Comprehensive metabolic panel  Result Value Ref Range   Sodium 135  135 - 145 mmol/L   Potassium 3.7 3.5 - 5.1 mmol/L   Chloride 99 (L) 101 - 111 mmol/L   CO2 32 22 - 32 mmol/L   Glucose, Bld 115 (H) 65 - 99 mg/dL   BUN 21 (H) 6 - 20 mg/dL   Creatinine, Ser 0.50 0.44 - 1.00 mg/dL   Calcium 10.0 8.9 - 10.3 mg/dL   Total Protein 4.9 (L) 6.5 - 8.1 g/dL   Albumin 2.3 (L) 3.5 - 5.0 g/dL   AST 32 15 - 41 U/L   ALT 47 14 - 54 U/L   Alkaline Phosphatase 101 38 - 126 U/L   Total Bilirubin 0.5 0.3 - 1.2 mg/dL   GFR calc non Af Amer >60 >60 mL/min   GFR calc Af Amer >60 >60 mL/min   Anion gap 4 (L) 5 - 15  Magnesium  Result Value Ref Range   Magnesium 1.6 (L) 1.7 - 2.4 mg/dL  Phosphorus  Result Value Ref Range   Phosphorus 2.9 2.5 - 4.6 mg/dL  Troponin I  Result Value Ref Range   Troponin I 0.08 (HH) <0.03 ng/mL  Lactic acid, plasma  Result Value Ref Range   Lactic Acid, Venous 0.5 0.5 - 1.9 mmol/L  CBC WITH DIFFERENTIAL  Result Value Ref Range   WBC 14.3 (H) 4.0 - 10.5 K/uL   RBC 3.72 (L) 3.87 - 5.11 MIL/uL   Hemoglobin 9.6 (L) 12.0 - 15.0 g/dL   HCT 32.9 (L) 36.0 - 46.0 %   MCV 88.4 78.0 - 100.0 fL   MCH 25.8 (L) 26.0 - 34.0 pg   MCHC 29.2 (L) 30.0 - 36.0 g/dL   RDW 15.8 (H) 11.5 - 15.5 %   Platelets 142 (L) 150 - 400 K/uL   Neutrophils Relative % 92 %   Neutro Abs 13.2 (H) 1.7 - 7.7 K/uL   Lymphocytes Relative 2 %   Lymphs Abs 0.2 (L) 0.7 - 4.0 K/uL   Monocytes Relative 6 %   Monocytes  Absolute 0.9 0.1 - 1.0 K/uL   Eosinophils Relative 0 %   Eosinophils Absolute 0.0 0.0 - 0.7 K/uL   Basophils Relative 0 %   Basophils Absolute 0.0 0.0 - 0.1 K/uL  Triglycerides  Result Value Ref Range   Triglycerides 82 <150 mg/dL  Glucose, capillary  Result Value Ref Range   Glucose-Capillary 92 65 - 99 mg/dL   Comment 1 Notify RN   CBC  Result Value Ref Range   WBC 11.1 (H) 4.0 - 10.5 K/uL   RBC 3.43 (L) 3.87 - 5.11 MIL/uL   Hemoglobin 8.7 (L) 12.0 - 15.0 g/dL   HCT 29.7 (L) 36.0 - 46.0 %   MCV 86.6 78.0 - 100.0 fL   MCH 25.4 (L) 26.0 - 34.0 pg   MCHC 29.3 (L) 30.0 - 36.0 g/dL   RDW 15.6 (H) 11.5 - 15.5 %   Platelets 145 (L) 150 - 400 K/uL  Basic metabolic panel  Result Value Ref Range   Sodium 137 135 - 145 mmol/L   Potassium 3.7 3.5 - 5.1 mmol/L   Chloride 100 (L) 101 - 111 mmol/L   CO2 32 22 - 32 mmol/L   Glucose, Bld 108 (H) 65 - 99 mg/dL   BUN 20 6 - 20 mg/dL   Creatinine, Ser 0.51 0.44 - 1.00 mg/dL   Calcium 9.5 8.9 - 10.3 mg/dL   GFR calc non Af Amer >60 >60 mL/min   GFR calc Af Amer >60 >60 mL/min  Anion gap 5 5 - 15  Magnesium  Result Value Ref Range   Magnesium 1.9 1.7 - 2.4 mg/dL  Phosphorus  Result Value Ref Range   Phosphorus 2.1 (L) 2.5 - 4.6 mg/dL  Glucose, capillary  Result Value Ref Range   Glucose-Capillary 91 65 - 99 mg/dL   Comment 1 Notify RN   Glucose, capillary  Result Value Ref Range   Glucose-Capillary 101 (H) 65 - 99 mg/dL   Comment 1 Notify RN   Glucose, capillary  Result Value Ref Range   Glucose-Capillary 94 65 - 99 mg/dL   Comment 1 Notify RN   Glucose, capillary  Result Value Ref Range   Glucose-Capillary 104 (H) 65 - 99 mg/dL   Comment 1 Capillary Specimen    Comment 2 Notify RN   Glucose, capillary  Result Value Ref Range   Glucose-Capillary 107 (H) 65 - 99 mg/dL   Comment 1 Capillary Specimen    Comment 2 Notify RN   Glucose, capillary  Result Value Ref Range   Glucose-Capillary 85 65 - 99 mg/dL   Comment 1  Capillary Specimen   Glucose, capillary  Result Value Ref Range   Glucose-Capillary 84 65 - 99 mg/dL   Comment 1 Capillary Specimen   Glucose, capillary  Result Value Ref Range   Glucose-Capillary 97 65 - 99 mg/dL   Comment 1 Notify RN   Glucose, capillary  Result Value Ref Range   Glucose-Capillary 89 65 - 99 mg/dL   Comment 1 Notify RN   Procalcitonin - Baseline  Result Value Ref Range   Procalcitonin 0.16 ng/mL  Glucose, capillary  Result Value Ref Range   Glucose-Capillary 75 65 - 99 mg/dL   Comment 1 Capillary Specimen    Comment 2 Notify RN   Glucose, capillary  Result Value Ref Range   Glucose-Capillary 75 65 - 99 mg/dL   Comment 1 Capillary Specimen    Comment 2 Notify RN   Basic metabolic panel  Result Value Ref Range   Sodium 139 135 - 145 mmol/L   Potassium 3.0 (L) 3.5 - 5.1 mmol/L   Chloride 97 (L) 101 - 111 mmol/L   CO2 35 (H) 22 - 32 mmol/L   Glucose, Bld 104 (H) 65 - 99 mg/dL   BUN 11 6 - 20 mg/dL   Creatinine, Ser 0.53 0.44 - 1.00 mg/dL   Calcium 9.6 8.9 - 10.3 mg/dL   GFR calc non Af Amer >60 >60 mL/min   GFR calc Af Amer >60 >60 mL/min   Anion gap 7 5 - 15  Phosphorus  Result Value Ref Range   Phosphorus 2.6 2.5 - 4.6 mg/dL  Magnesium  Result Value Ref Range   Magnesium 1.7 1.7 - 2.4 mg/dL  Glucose, capillary  Result Value Ref Range   Glucose-Capillary 116 (H) 65 - 99 mg/dL  Glucose, capillary  Result Value Ref Range   Glucose-Capillary 105 (H) 65 - 99 mg/dL  Glucose, capillary  Result Value Ref Range   Glucose-Capillary 107 (H) 65 - 99 mg/dL  Glucose, capillary  Result Value Ref Range   Glucose-Capillary 92 65 - 99 mg/dL  Glucose, capillary  Result Value Ref Range   Glucose-Capillary 129 (H) 65 - 99 mg/dL  CBC  Result Value Ref Range   WBC 6.1 4.0 - 10.5 K/uL   RBC 3.49 (L) 3.87 - 5.11 MIL/uL   Hemoglobin 8.9 (L) 12.0 - 15.0 g/dL   HCT 30.8 (L) 36.0 - 46.0 %  MCV 88.3 78.0 - 100.0 fL   MCH 25.5 (L) 26.0 - 34.0 pg   MCHC 28.9 (L)  30.0 - 36.0 g/dL   RDW 15.5 11.5 - 15.5 %   Platelets 276 150 - 400 K/uL  Basic metabolic panel  Result Value Ref Range   Sodium 140 135 - 145 mmol/L   Potassium 4.3 3.5 - 5.1 mmol/L   Chloride 97 (L) 101 - 111 mmol/L   CO2 40 (H) 22 - 32 mmol/L   Glucose, Bld 97 65 - 99 mg/dL   BUN 8 6 - 20 mg/dL   Creatinine, Ser 0.42 (L) 0.44 - 1.00 mg/dL   Calcium 9.7 8.9 - 10.3 mg/dL   GFR calc non Af Amer >60 >60 mL/min   GFR calc Af Amer >60 >60 mL/min   Anion gap 3 (L) 5 - 15  Glucose, capillary  Result Value Ref Range   Glucose-Capillary 148 (H) 65 - 99 mg/dL  Glucose, capillary  Result Value Ref Range   Glucose-Capillary 127 (H) 65 - 99 mg/dL   Comment 1 Notify RN   Glucose, capillary  Result Value Ref Range   Glucose-Capillary 91 65 - 99 mg/dL  Glucose, capillary  Result Value Ref Range   Glucose-Capillary 90 65 - 99 mg/dL  Glucose, capillary  Result Value Ref Range   Glucose-Capillary 93 65 - 99 mg/dL  Glucose, capillary  Result Value Ref Range   Glucose-Capillary 124 (H) 65 - 99 mg/dL  I-STAT 3, arterial blood gas (G3+)  Result Value Ref Range   pH, Arterial 7.597 (H) 7.350 - 7.450   pCO2 arterial 33.7 32.0 - 48.0 mmHg   pO2, Arterial 58.0 (L) 83.0 - 108.0 mmHg   Bicarbonate 32.9 (H) 20.0 - 28.0 mmol/L   TCO2 34 0 - 100 mmol/L   O2 Saturation 94.0 %   Acid-Base Excess 11.0 (H) 0.0 - 2.0 mmol/L   Patient temperature 98.0 F    Collection site RADIAL, ALLEN'S TEST ACCEPTABLE    Drawn by RT    Sample type ARTERIAL   I-STAT 3, arterial blood gas (G3+)  Result Value Ref Range   pH, Arterial 7.296 (L) 7.350 - 7.450   pCO2 arterial 71.8 (HH) 32.0 - 48.0 mmHg   pO2, Arterial 155.0 (H) 83.0 - 108.0 mmHg   Bicarbonate 35.0 (H) 20.0 - 28.0 mmol/L   TCO2 37 0 - 100 mmol/L   O2 Saturation 99.0 %   Acid-Base Excess 6.0 (H) 0.0 - 2.0 mmol/L   Patient temperature 98.6 F    Collection site RADIAL, ALLEN'S TEST ACCEPTABLE    Drawn by RT    Sample type ARTERIAL    Comment  NOTIFIED PHYSICIAN       Assessment & Plan:   1. COPD with acute exacerbation ([place])  2. Gastroesophageal reflux disease, esophagitis presence not specified  3. Anxiety and depression  Over the weekend admitted, will see her in hospital follow up Anticipate home health referral  No current facility-administered medications for this visit.  No current outpatient prescriptions on file.  Facility-Administered Medications Ordered in Other Visits:  .  albuterol (PROVENTIL) (2.5 MG/3ML) 0.083% nebulizer solution 2.5 mg, 2.5 mg, Nebulization, Q4H PRN, Waldemar Dickens, MD, 2.5 mg at 04/04/17 0015 .  ALPRAZolam Duanne Moron) tablet 0.5 mg, 0.5 mg, Oral, BID PRN, Waldemar Dickens, MD .  chlorhexidine (PERIDEX) 0.12 % solution 15 mL, 15 mL, Mouth Rinse, BID, Waldemar Dickens, MD, 15 mL at 04/04/17 0946 .  donepezil (ARICEPT)  tablet 10 mg, 10 mg, Oral, QHS, Waldemar Dickens, MD, 10 mg at 04/03/17 2200 .  enoxaparin (LOVENOX) injection 30 mg, 30 mg, Subcutaneous, Q24H, Waldemar Dickens, MD, 30 mg at 04/03/17 2201 .  FLUoxetine (PROZAC) capsule 40 mg, 40 mg, Oral, Daily, Waldemar Dickens, MD, 40 mg at 04/04/17 0946 .  fluticasone furoate-vilanterol (BREO ELLIPTA) 200-25 MCG/INH 1 puff, 1 puff, Inhalation, Daily, Waldemar Dickens, MD, 1 puff at 04/04/17 0926 .  furosemide (LASIX) tablet 40 mg, 40 mg, Oral, Daily, Waldemar Dickens, MD, 40 mg at 04/04/17 0946 .  ipratropium-albuterol (DUONEB) 0.5-2.5 (3) MG/3ML nebulizer solution 3 mL, 3 mL, Nebulization, Q6H, Waldemar Dickens, MD, 3 mL at 04/04/17 0645 .  levofloxacin (LEVAQUIN) tablet 500 mg, 500 mg, Oral, Daily, Abrol, Nayana, MD, 500 mg at 04/04/17 0946 .  MEDLINE mouth rinse, 15 mL, Mouth Rinse, q12n4p, Waldemar Dickens, MD, 15 mL at 04/03/17 1628 .  methylPREDNISolone sodium succinate (SOLU-MEDROL) 125 mg/2 mL injection 60 mg, 60 mg, Intravenous, Q12H, Abrol, Nayana, MD .  ondansetron (ZOFRAN) tablet 4 mg, 4 mg, Oral, Q6H PRN **OR** ondansetron (ZOFRAN)  injection 4 mg, 4 mg, Intravenous, Q6H PRN, Waldemar Dickens, MD, 4 mg at 04/04/17 5885 .  oxyCODONE-acetaminophen (PERCOCET/ROXICET) 5-325 MG per tablet 1 tablet, 1 tablet, Oral, Q8H PRN, Waldemar Dickens, MD .  pantoprazole (PROTONIX) EC tablet 80 mg, 80 mg, Oral, Q1200, Waldemar Dickens, MD, 80 mg at 04/03/17 1109  Continue all other maintenance medications as listed above.  Follow up plan: Return in about 4 weeks (around 04/29/2017) for RECORDS from [facility] 1 year, recheck.  Educational handout given for [place] PA-C [place] [address]  [place], [place] [address] [phone]   04/04/2017, 11:10 AM

## 2017-04-04 NOTE — Progress Notes (Addendum)
Initial Nutrition Assessment  DOCUMENTATION CODES:  Underweight  Severe malnutrition in the context of chronic illness (COPD, Dementia)    INTERVENTION:  Ensure Enlive po BID, each supplement provides 350 kcal and 20 grams of protein   Recommend liberalize to [employer] if agreeable with MD due to pt poor po intake   NUTRITION DIAGNOSIS:   Malnutrition related to chronic illness COPD, Dementia as evidenced by severe depletion of muscle mass, moderate depletion of body fat.   GOAL:    (Meet nutrition needs as able given patients goals of care.)   MONITOR:   PO intake, Supplement acceptance, Weight trends, Labs  REASON FOR ASSESSMENT:   Consult COPD Protocol  ASSESSMENT: Olivia Randall is a pleasant, frail 66 yo with hx of Olivia, COPD, CHF, GERD, Dementia. She was hospitalized earlier this month for COPD exacerbation and has been living with her sister. Her appetite has been poor- the patient says she just can't bring herself to eat. Today at lunch she ate 2 oz of vanilla pudding and 1/2 dinner roll. She did not drink her tea or juice. She is able to feed herself and needs soft foods due to her being endentulous.  She presents with COPD exacerbation and concern for metastatic cancer. The family have decided not to pursue bronchoscopy and per palliative medicine she is not a candidate for chemotherapy.   Weight hx: continued unplanned wt loss- her wt is down 30% in the past 14 months which is severe loss. Meets criteria for severe malnutrition due to wt loss and severe muscle loss (clavicle, deltoids, quadriceps) Nutrition-Focused physical exam completed. Findings are moderate fat depletion, severe muscle depletion, and no edema.    Recent Labs Lab 04/02/17 1658 04/02/17 1910 04/03/17 0541 04/04/17 0412  NA 140  --  136  --   K 4.6  --  4.4  --   CL 92*  --  89*  --   CO2 44*  --  40*  --   BUN 7  --  10  --   CREATININE 0.35*  --  0.45  --   CALCIUM 10.5*  --  10.0  --    MG  --  1.5*  --  1.3*  PHOS  --  3.6  --   --   GLUCOSE 120*  --  112*  --    Labs reviewed: Mag. 1.3 (low) , phos WNL  Diet Order:  Diet 2 gram sodium Room service appropriate? Yes; Fluid consistency: Thin  Skin:  Reviewed, no issues  Last BM:  7/21   Height:   Ht Readings from Last 1 Encounters:  04/03/17 5\' 4"  (1.626 m)    Weight:   Wt Readings from Last 1 Encounters:  04/04/17 95 lb 0.3 oz (43.1 kg)    Ideal Body Weight:  55 kg  BMI:  Body mass index is 16.31 kg/m.  Estimated Nutritional Needs:   Kcal:  9379-0240   Protein:  56-60 gr  Fluid:  1.4-1.6 liters daily  EDUCATION NEEDS:   No education needs identified at this time  Colman Cater Olivia,RD,CSG,LDN Office: #[phone] Pager: [phone]

## 2017-04-04 NOTE — Progress Notes (Addendum)
[facility] PROGRESS NOTE  Olivia Randall VOZ:[id number] DOB: 09-Feb-1951 DOA: 04/02/2017 PCP: Octavio Graves, DO  Brief summary   66 y.o. female with medical history significant of ancxiety/depression, COPD, GERD, HTN, MS,  CHF, dementia. Patient was discharged from his current hospital on 03/15/2017 after admission for COPD exacerbation requiring intubation. Patient has gone from a skilled nursing facility/rehabilitation facility to different family members. Patient currently presenting to independent hospital from her sister's home who has been her most recent and closest caretaker. Patient presented with somnolence last several days. Patient saw her primary care physician one day ago without any respiratory complaints. Per family report on [name] admission she was found to be extremely lethargic and difficult to wake up. There are no focal deficits on their exam area no reported confusion part because patient would not wake up long enough to say whether 1 or 2 words.      Assessment/Plan:  Acute on chronic respiratory failure. COPD exacerbation. ABG showing pH 7.19, PCO2 1:15, PaO2 128, bicarbonate 34.9.  -ABG improved. will cont BiPAP qhs , cont bronchodilators, start tapering steroids, Levaquin to PO, negative Respiratory viral panel   Suspect metastatic lung cancer, end stage copd, will consult palliative care for goals of care discussions prior to discharge. Patient may need to be made hospice as well. Family stated that patient was recently admitted to [facility] was diagnosed with lung cancer told showing a couple months to live.  -CT scan was obtain and showed Grossly stable spiculated mass in the left upper lobe measuring 2.4 cm concerning for malignancy. small pulmonary nodules in the posterior left lower lobe concerning for metastatic nodules. Interim finding of a 2.9 cm low density mass in the left hepatic lobe, concerning for metastatic disease. Interval finding of  lytic and sclerotic lesions within the T11, T12and L2 vertebral bodies, also concerning for metastatic disease. She has not yet had a bx or any rx for lung cancer    Chronic diastolic congestive heart failure: Last echo showing EF 60% and grade 1 diastolic disruption. No evidence of acute decompensation - continue lasix. strict I/O, daily wts  Severe protein calorie malnutrition: Patient weighs 94 pounds. Very low oral intake over the last several weeks. - PT/OT/nutrition consult, pre-albumin  Hyperglycemia, ? Steroid induce. Hemoglobin A1c pending  Tobacco use: 1-2 per day  Chronic pain: continue percocet  Depression/anxiety:  continue prozac, xanax  Dementia: continue aricept  GERD: continue PPI   Code Status: DNR Family Communication: d/w patient, RN. Discussed with daughter  (indicate person spoken with, relationship, and if by phone, the number) Disposition Plan:  Transfer to telemetry   Consultants:  Palliative care  Procedures:  BiPAP  Antibiotics: Anti-infectives    Start     Dose/Rate Route Frequency Ordered Stop   04/03/17 2000  levofloxacin (LEVAQUIN) IVPB 500 mg     500 mg 100 mL/hr over 60 Minutes Intravenous Every 24 hours 04/02/17 2013     04/02/17 2000  levofloxacin (LEVAQUIN) IVPB 500 mg     500 mg 100 mL/hr over 60 Minutes Intravenous  Once 04/02/17 1916 04/02/17 2140       (indicate start date, and stop date if known)  HPI/Subjective: Required BiPAP. Currently off BiPAP and on 4 L nasal cannula  Objective: Vitals:   04/04/17 0600 04/04/17 0800  BP: (!) 155/79 130/75  Pulse: 85 91  Resp: 18 15  Temp:      Intake/Output Summary (Last 24 hours) at 04/04/17 4259 Last data filed  at 04/04/17 0804  Gross per 24 hour  Intake          1000.83 ml  Output                0 ml  Net          1000.83 ml   Filed Weights   04/03/17 0500 04/03/17 1013 04/04/17 0500  Weight: 44.3 kg (97 lb 10.6 oz) 44.7 kg (98 lb 8.7 oz) 43.1 kg (95 lb 0.3  oz)    Exam:   General:  Alert, oriented to person, place  Cardiovascular: s1,s2 rrr  Respiratory: diminished BL  Abdomen: soft, nt   Musculoskeletal: no leg edema   Data Reviewed: Basic Metabolic Panel:  Recent Labs Lab 04/02/17 1658 04/02/17 1910 04/03/17 0541  NA 140  --  136  K 4.6  --  4.4  CL 92*  --  89*  CO2 44*  --  40*  GLUCOSE 120*  --  112*  BUN 7  --  10  CREATININE 0.35*  --  0.45  CALCIUM 10.5*  --  10.0  MG  --  1.5*  --   PHOS  --  3.6  --    Liver Function Tests:  Recent Labs Lab 04/02/17 1658 04/03/17 0541  AST 13* 13*  ALT 13* 11*  ALKPHOS 136* 113  BILITOT 0.6 0.7  PROT 6.2* 5.4*  ALBUMIN 3.1* 2.7*   No results for input(s): LIPASE, AMYLASE in the last 168 hours.  Recent Labs Lab 04/02/17 1658  AMMONIA 52*   CBC:  Recent Labs Lab 04/02/17 1658 04/03/17 0541  WBC 7.2 3.4*  NEUTROABS 6.3  --   HGB 12.0 10.7*  HCT 41.3 36.4  MCV 92.4 90.5  PLT 227 218   Cardiac Enzymes:  Recent Labs Lab 04/02/17 1658  TROPONINI <0.03   BNP (last 3 results)  Recent Labs  09/05/16 0033 10/26/16 0526  BNP 36.0 11.0    ProBNP (last 3 results) No results for input(s): PROBNP in the last 8760 hours.  CBG: No results for input(s): GLUCAP in the last 168 hours.  Recent Results (from the past 240 hour(s))  Respiratory Panel by PCR     Status: None   Collection Time: 04/02/17  8:00 PM  Result Value Ref Range Status   Adenovirus NOT DETECTED NOT DETECTED Final   Coronavirus 229E NOT DETECTED NOT DETECTED Final   Coronavirus HKU1 NOT DETECTED NOT DETECTED Final   Coronavirus NL63 NOT DETECTED NOT DETECTED Final   Coronavirus OC43 NOT DETECTED NOT DETECTED Final   Metapneumovirus NOT DETECTED NOT DETECTED Final   Rhinovirus / Enterovirus NOT DETECTED NOT DETECTED Final   Influenza A NOT DETECTED NOT DETECTED Final   Influenza B NOT DETECTED NOT DETECTED Final   Parainfluenza Virus 1 NOT DETECTED NOT DETECTED Final    Parainfluenza Virus 2 NOT DETECTED NOT DETECTED Final   Parainfluenza Virus 3 NOT DETECTED NOT DETECTED Final   Parainfluenza Virus 4 NOT DETECTED NOT DETECTED Final   Respiratory Syncytial Virus NOT DETECTED NOT DETECTED Final   Bordetella pertussis NOT DETECTED NOT DETECTED Final   Chlamydophila pneumoniae NOT DETECTED NOT DETECTED Final   Mycoplasma pneumoniae NOT DETECTED NOT DETECTED Final    Comment: Performed at [facility], [address], [place], [place] [address]  MRSA PCR Screening     Status: None   Collection Time: 04/02/17  8:00 PM  Result Value Ref Range Status   MRSA by  PCR NEGATIVE NEGATIVE Final    Comment:        The GeneXpert MRSA Assay (FDA approved for NASAL specimens only), is one component of a comprehensive MRSA colonization surveillance program. It is not intended to diagnose MRSA infection nor to guide or monitor treatment for MRSA infections.      Studies: Ct Chest W Contrast  Result Date: 04/02/2017 CLINICAL DATA:  Decreased energy, weight loss, fever, chest pain, shortness of breath EXAM: CT CHEST WITH CONTRAST TECHNIQUE: Multidetector CT imaging of the chest was performed during intravenous contrast administration. CONTRAST:  68mL ISOVUE-300 IOPAMIDOL (ISOVUE-300) INJECTION 61% COMPARISON:  04/02/2017, CT chest 12/05/2016, 01/17/2013 FINDINGS: Cardiovascular: Atherosclerotic calcifications within the aorta. No aneurysmal dilatation. Normal heart size. No significant pericardial effusion. Mediastinum/Nodes: Mediastinal adenopathy re- demonstrated with 14 mm AP window lymph node as before. Increased size of a lymph node adjacent to the great vessels, measuring 11 mm. Subcentimeter hypodense right thyroid nodule. Midline trachea. Air distention of the esophagus. Otherwise unremarkable. Lungs/Pleura: Moderate emphysema. Spiculated left upper lobe lung nodule measuring 2.4 x 1.5 by 1.9 cm, not significantly changed in size. Development of additional  pulmonary nodules within the posterior left lower lobe measuring 8 mm and 5 mm. Bronchiectasis and scarring in the medial right lung base. No pleural effusion. Mild tree-in-bud pattern right middle lobe unchanged. Upper Abdomen: Interval finding of a low-density mass measuring approximately 2.9 cm within the left hepatic lobe. Surgical clips in the gallbladder fossa. subcentimeter hypodensity left kidney, too small to further characterize. Adrenal glands are within normal limits. Musculoskeletal: Development of large lucent lesion in L2 vertebral body. Interim finding of mixed lucent and sclerotic lesion in T12 and T11. Mild wedging of T7 unchanged. Degenerative changes elsewhere in the spine. Partially visualized surgical plate and screw fixation of the lower cervical spine. IMPRESSION: 1. Grossly stable spiculated mass in the left upper lobe measuring 2.4 cm concerning for malignancy. Development of small pulmonary nodules in the posterior left lower lobe concerning for metastatic nodules. Slight increased adenopathy in the mediastinum since the prior CT. PET-CT previously recommended for further evaluation. 2. Interim finding of a 2.9 cm low density mass in the left hepatic lobe, concerning for metastatic disease. 3. Interval finding of lytic and sclerotic lesions within the T11, T12 and L2 vertebral bodies, also concerning for metastatic disease. 4. Moderate emphysema Aortic Atherosclerosis (ICD10-I70.0) and Emphysema (ICD10-J43.9). Electronically Signed   By: Donavan Foil M.D.   On: 04/02/2017 20:18   Dg Chest Port 1 View  Result Date: 04/02/2017 CLINICAL DATA:  Shortness of breath EXAM: PORTABLE CHEST 1 VIEW COMPARISON:  Multiple priors, most recently 03/13/2017 FINDINGS: Blunting of the right costophrenic angle, possibly chronic pleural thickening versus a small right pleural effusion. Increased interstitial markings without frank interstitial edema. Left lung is clear. No pneumothorax. Heart is normal  in size. Cervical spine fixation hardware. IMPRESSION: Chronic blunting of the right costophrenic angle, possibly pleural thickening versus a small pleural effusion. No evidence of acute cardiopulmonary disease. Electronically Signed   By: Julian Hy M.D.   On: 04/02/2017 17:32    Scheduled Meds: . chlorhexidine  15 mL Mouth Rinse BID  . donepezil  10 mg Oral QHS  . enoxaparin (LOVENOX) injection  30 mg Subcutaneous Q24H  . FLUoxetine  40 mg Oral Daily  . fluticasone furoate-vilanterol  1 puff Inhalation Daily  . furosemide  40 mg Oral Daily  . ipratropium-albuterol  3 mL Nebulization Q6H  . mouth rinse  15 mL Mouth  Rinse q12n4p  . methylPREDNISolone (SOLU-MEDROL) injection  60 mg Intravenous Q8H  . pantoprazole  80 mg Oral Q1200   Continuous Infusions: . sodium chloride 50 mL/hr at 04/03/17 1629  . dextrose 5 % 100 mL with magnesium sulfate 2 g infusion    . levofloxacin [facility]) IV Stopped (04/03/17 2111)    Active Problems:   Chronic back pain   Tobacco use disorder   Anxiety and depression   COPD exacerbation ([place])   Acute on chronic respiratory failure ([place])   Severe protein-calorie malnutrition [name]: less than 60% of standard weight) ([place])    Time spent: > 35 minutes     Dorisann Schwanke  [facility]  . If 7PM-7AM, please contact night-coverage at [email or web address], password [facility] 04/04/2017, 8:36 AM  LOS: 2 days

## 2017-04-04 NOTE — Consult Note (Signed)
Consultation Note Date: 04/04/2017   Patient Name: Olivia Randall  DOB: [birth year]  MRN: [id number]  Age / Sex: 66 y.o., female  PCP: Octavio Graves, DO Referring Physician: Reyne Dumas, MD  Reason for Consultation: Establishing goals of care  HPI/Patient Profile: 66 y.o. female  with past medical history of Dementia, anxiety and depression, COPD, GE RD, CHF, recent hospitalization 7/034 COPD exacerbation requiring intubation admitted on 04/02/2017 with acute on chronic respiratory failure with COPD exacerbation and suspected metastatic lung cancer with burden to liver, frailty.   Clinical Assessment and Goals of Care: [name] is resting quietly in bed. She greets me making but not keeping  eye contact as I enter. She is pleasantly confused today, she is unsure of the month, and it takes are several moments to tell me that we are in a nursing home. There is no family at bedside at this time. We talk briefly about who she wants to make choices for her if she cannot. She names her sister, Olivia Randall. I share that I recognize her from caring for her husband here in this hospital.  We talk briefly about "what's in your lungs". [name] states that she has not sought any treatments. Ask her if she would want to do anything about the cancer, and she states that she does not. I ask if she would want chemotherapy, cancer treatments. At this point she said she would. I shared that this would involve taking a sample from her lung, and I worry that she is too weak for this. She agrees. I share that she seems quite report weak and frail and would likely not be a candidate for any cancer treatments. [name] states her goal is to return to her sister Olivia Randall's home  Call to sister, Olivia Randall. Olivia Randall states that [name] came to her home about a week ago. Olivia Randall goes on to tell a detailed story about  her suspicion that [name] 2 grandchildren, Olivia Randall and Olivia Randall, are taking her money and taking her pain and anxiety medications. Olivia Randall states that she is change physicians for her sister, and that her sister weighed 75 pounds last Friday. She had hoped to "fatten her up". But after a few days, [name] stopped eating as much. Olivia Randall asks if [name] is appropriate for hospice. She states that her husband had hospice with the support of their children. Olivia Randall states that she would prefer for her sister to go to a skilled nursing facility, and is open to hospice.  Call from ICU staff, extended family is available for a family meeting.  Family meeting with daughter, Olivia Randall and granddaughter Olivia Randall. We talk about [name]'s chronic and acute health problems. We talk in detail about heart failure, dementia, COPD and what we are classifying as likely lung cancer with metastatic burden to liver. We talk about [name] in her frailty, her functional decline. Olivia Randall and Olivia Randall often want to talk about psychosocial issues, family issues between themselves and [name]'s sister Olivia Randall.  I share frequently that I cannot mediate family problems. Olivia Randall, "why will no one except my power of attorney".  I review documents provided by Olivia Randall showing that she is indeed healthcare and durable power of attorney. A copy of healthcare power of attorney is placed on the chart. Letter provided for Olivia Randall verifying that [name] has a known diagnosis of dementia, is unable to make sound financial decisions.  We talk about what's best for [name]. Olivia Randall endorses that she is not able to care for her grandmother at home alone. We talk about going to rehab, and also the possibility of being a resident of SNF. We talk about some of the financial repercussions of long-term care.   We talk about [name]'s prognosis with permission. No details are given, but I share that we see people sleep  more, eat less, interact less as they near end of life. I share that [name]'s time is likely short.  I share that she is to frail to take chemotherapy. Olivia Randall and Olivia Randall agree, they decline bronchoscopy for tissue biopsy. I share that we will talk more tomorrow.   Healthcare power of attorney NEXT OF KIN - during 1st visit, sister Olivia Randall stated that there was no legal healthcare power of attorney. Granddaughter Olivia Randall arrives and provides legal healthcare power of attorney and durable power of attorney for [name]. A copy is added to her chart. Olivia Randall [phone]  Daughter Olivia Randall [phone].     SUMMARY OF RECOMMENDATIONS   At this point, continue to treat the treatable, but no extraordinary measures such as CPR or intubation. At this time granddaughter/legal [facility] POA Olivia Randall agrees that [name] is not strong enough for chemotherapy, she sees no need for lung biopsy.  Code Status/Advance Care Planning:  DNR  Symptom Management:   per hospitalist, no additional needs at this time.  Palliative Prophylaxis:   Frequent Pain Assessment and Turn Reposition  Additional Recommendations (Limitations, Scope, Preferences):  At this point, continue to treat the treatable but no extraordinary measures such as CPR or intubation.  Psycho-social/Spiritual:   Desire for further Chaplaincy support:no  Additional Recommendations: Caregiving  Support/Resources and Education on Hospice  Prognosis:   < 3 months, would not be surprising based on frailty, functional decline per Sr. Olivia Randall, suspected metastatic lung cancer with burden to liver.  Discharge Planning: To be determined, [name] safest disposition plan is to SNF rehab/residential. This is sister Olivia Randall's desire also.      Primary Diagnoses: Present on Admission: . Acute on chronic respiratory failure ([place]) . Anxiety and depression . Chronic back pain . COPD  exacerbation ([place]) . Tobacco use disorder   I have reviewed the medical record, interviewed the patient and family, and examined the patient. The following aspects are pertinent.  Past Medical History:  Diagnosis Date  . Anxiety and depression   . Asthma   . CHF (congestive heart failure) ([place])   . Chronic back pain   . Chronic neck pain   . COPD (chronic obstructive pulmonary disease) ([place])   . 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 Dementia   . Depression   . GERD (gastroesophageal reflux disease)   . Hypertension   . MS (multiple sclerosis) ([place])   . MS (multiple sclerosis) ([place])    staes xrays showed signs of ms  . On home O2    2L N/C  . Shortness of breath dyspnea   . Sleep apnea  Social History   Social History  . Marital status: Married    Spouse name: N/A  . Number of children: N/A  . Years of education: N/A   Social History Main Topics  . Smoking status: Current Every Day Smoker    Types: Cigarettes  . Smokeless tobacco: Never Used     Comment: 3-4 cigarettes a day  . Alcohol use No  . Drug use: No  . Sexual activity: No   Other Topics Concern  . None   Social History Narrative  . None   Family History  Problem Relation Age of Onset  . Diabetes Mother   . Diabetes Father   . Diabetes Brother   . Colon cancer Neg Hx    Scheduled Meds: . chlorhexidine  15 mL Mouth Rinse BID  . donepezil  10 mg Oral QHS  . enoxaparin (LOVENOX) injection  30 mg Subcutaneous Q24H  . FLUoxetine  40 mg Oral Daily  . fluticasone furoate-vilanterol  1 puff Inhalation Daily  . furosemide  40 mg Oral Daily  . ipratropium-albuterol  3 mL Nebulization Q6H  . levofloxacin  500 mg Oral Daily  . mouth rinse  15 mL Mouth Rinse q12n4p  . methylPREDNISolone (SOLU-MEDROL) injection  60 mg Intravenous Q12H  . pantoprazole  80 mg Oral Q1200   Continuous Infusions: PRN Meds:.albuterol, ALPRAZolam, ondansetron **OR** ondansetron (ZOFRAN) IV,  oxyCODONE-acetaminophen Medications Prior to Admission:  Prior to Admission medications   Medication Sig Start Date End Date Taking? Authorizing Provider  acetaminophen (TYLENOL) 325 MG tablet Take 2 tablets (650 mg total) by mouth every 6 (six) hours as needed for mild pain, moderate pain, fever or headache (or Fever >/= 101). 01/27/17  Yes Hongalgi, Lenis Dickinson, MD  ALPRAZolam Duanne Moron) 0.5 MG tablet Take 1 tablet (0.5 mg total) by mouth 2 (two) times daily as needed for anxiety. 04/01/17  Yes Terald Sleeper, PA-C  donepezil (ARICEPT) 10 MG tablet Take 10 mg by mouth at bedtime.    Yes [provider]  esomeprazole (NEXIUM) 40 MG capsule Take 1 capsule (40 mg total) by mouth daily. 04/01/17  Yes Terald Sleeper, PA-C  FLUoxetine (PROZAC) 40 MG capsule Take 1-2 capsules (40-80 mg total) by mouth daily. 04/01/17  Yes Terald Sleeper, PA-C  fluticasone furoate-vilanterol (BREO ELLIPTA) 200-25 MCG/INH AEPB Inhale 1 puff into the lungs daily. 04/01/17  Yes Terald Sleeper, PA-C  furosemide (LASIX) 40 MG tablet Take 1 tablet (40 mg total) by mouth daily. 04/01/17  Yes Terald Sleeper, PA-C  ipratropium-albuterol (DUONEB) 0.5-2.5 (3) MG/3ML SOLN Take 3 mLs by nebulization 3 (three) times daily. 04/01/17  Yes Terald Sleeper, PA-C  oxyCODONE-acetaminophen (ROXICET) 5-325 MG tablet Take 1 tablet by mouth every 8 (eight) hours as needed for severe pain. Chronic pain 04/01/17  Yes Terald Sleeper, PA-C   Allergies  Allergen Reactions  . Clopidogrel Other (See Comments)    Confusion and behavioral changes (altered)   Review of Systems  Unable to perform ROS: Mental status change    Physical Exam  Constitutional: No distress.  Frail, acute/chronically ill appearing. Calm and cooperative, confused.   HENT:  Head: Atraumatic.  Frail, acute/chronically ill appearing. Calm and cooperative, confused.   Cardiovascular: Normal rate and regular rhythm.   Pulmonary/Chest: Effort normal. No respiratory distress.   Able to talk without being SOB  Abdominal: Soft. She exhibits no distension.  Musculoskeletal: She exhibits no edema.  Muscle wasting  Neurological: She is alert.  Oriented  to self, unable to give month, location is "nursing home"  Skin: Skin is warm and dry.  Nursing note and vitals reviewed.   Vital Signs: BP (!) 155/80 (BP Location: Right Arm)   Pulse 88   Temp 98.5 F (36.9 C) (Oral)   Resp 16   Ht 5\' 4"  (1.626 m)   Wt 43.1 kg (95 lb 0.3 oz)   SpO2 99%   BMI 16.31 kg/m  Pain Assessment: 0-10 POSS *See Group Information*: 1-Acceptable,Awake and alert Pain Score: 0-No pain   SpO2: SpO2: 99 % O2 Device:SpO2: 99 % O2 Flow Rate: .O2 Flow Rate (L/min): 2 L/min  IO: Intake/output summary:  Intake/Output Summary (Last 24 hours) at 04/04/17 1158 Last data filed at 04/04/17 0804  Gross per 24 hour  Intake           945.83 ml  Output                0 ml  Net           945.83 ml    LBM: Last BM Date: 04/02/17 Baseline Weight: Weight: 43.5 kg (95 lb 14.4 oz) Most recent weight: Weight: 43.1 kg (95 lb 0.3 oz)     Palliative Assessment/Data:   Flowsheet Rows     Most Recent Value  Intake Tab  Referral Department  Hospitalist  Unit at Time of Referral  Med/Surg Unit  Palliative Care Primary Diagnosis  Pulmonary  Date Notified  04/03/17  Palliative Care Type  New Palliative care  Reason for referral  Clarify Goals of Care  Date of Admission  04/02/17  Date first seen by Palliative Care  04/04/17  # of days Palliative referral response time  1 Day(s)  # of days IP prior to Palliative referral  1  Clinical Assessment  Palliative Performance Scale Score  30%  Pain Max last 24 hours  Not able to report  Pain Min Last 24 hours  Not able to report  Dyspnea Max Last 24 Hours  Not able to report  Dyspnea Min Last 24 hours  Not able to report  Psychosocial & Spiritual Assessment  Palliative Care Outcomes  Patient/Family meeting held?  Yes  Who was at the meeting?   patient at bedside, sister Olivia Randall via phone  [place] regarding hospice, Provided psychosocial or spiritual support, Clarified goals of care  Patient/Family wishes: Interventions discontinued/not started   Mechanical Ventilation      Time In:     0950   1210 Time Out: 1020    1250 Time Total: 30 + 40 = 70 minutes Greater than 50%  of this time was spent counseling and coordinating care related to the above assessment and plan.  Signed by: Drue Novel, NP   Please contact Palliative Medicine Team phone at [phone] for questions and concerns.  For individual provider: See Shea Evans

## 2017-04-04 NOTE — Progress Notes (Signed)
Daughter Loralyn Freshwater [phone]

## 2017-04-05 LAB — CBC
HCT: 35.8 % — ABNORMAL LOW (ref 36.0–46.0)
HEMOGLOBIN: 11.6 g/dL — AB (ref 12.0–15.0)
MCH: 26.9 pg (ref 26.0–34.0)
MCHC: 32.4 g/dL (ref 30.0–36.0)
MCV: 82.9 fL (ref 78.0–100.0)
PLATELETS: 218 10*3/uL (ref 150–400)
RBC: 4.32 MIL/uL (ref 3.87–5.11)
RDW: 14 % (ref 11.5–15.5)
WBC: 8.6 10*3/uL (ref 4.0–10.5)

## 2017-04-05 MED ORDER — LEVOFLOXACIN 500 MG PO TABS
500.0000 mg | ORAL_TABLET | Freq: Every day | ORAL | Status: DC
  Administered 2017-04-05 – 2017-04-06 (×2): 500 mg via ORAL
  Filled 2017-04-05 (×2): qty 1

## 2017-04-05 MED ORDER — MAGNESIUM SULFATE 50 % IJ SOLN
2.0000 g | Freq: Once | INTRAVENOUS | Status: AC
  Administered 2017-04-05: 2 g via INTRAVENOUS
  Filled 2017-04-05: qty 4

## 2017-04-05 NOTE — Care Management (Signed)
Per Palliative NP, family Cyril [facility]) would like patient's information sent to [facility] for review.

## 2017-04-05 NOTE — Progress Notes (Signed)
Daily Progress Note   Patient Name: Olivia Randall       Date: 04/05/2017 DOB: April 13, 1951  Age: 66 y.o. MRN#: [id number] Attending Physician: Reyne Dumas, MD Primary Care Physician: Octavio Graves, DO Admit Date: 04/02/2017  Reason for Consultation/Follow-up: Establishing goals of care, Hospice Evaluation and Psychosocial/spiritual support  Subjective: Olivia Randall is sitting quietly in her Wilder chair. She greets me making but not keeping eye contact as I enter. She shares that she does not feel well today, she tells me she is cold. There is no family of bedside at this time. She is pleasantly confused as per her norm, no questions.  Notice from case manager and nursing that sister, Olivia Randall is requesting a phone call. Returned phone call to Olivia Randall to update her on Olivia Randall condition. I share that PT has recommended home health services. Olivia Randall states her preferences is for Olivia Randall to go to a nursing home, I share that this would not be covered under insurance.  Although we believe that she would get good care in such a facility, it is not covered with her insurance, and family cannot pay out-of-pocket. We discussed home with hospice, Olivia Randall agrees. Olivia Randall goes further to continue talking about the finances of her sister.  She tells me that she has paid for Adi's burial plot.  Call to Olivia Randall/Olivia Randall, Olivia Randall. Olivia Randall and her mother Olivia Randall are in the way to the hospital. I share that Olivia Randall has been recommended for home health physical therapy, but I feel home hospice would be a huge benefit. Olivia Randall and Olivia Randall both agree that they need the benefits of in-home hospice. They state their request is for the service that their Olivia Randall more charts used  (which was hospice of [facility]).  We talk about durable power of attorney, I share with Olivia Randall that she needs to take her power of attorney paperwork to the register of deeds to file this paperwork with the court, $26 cash or personal check.  Call to hospice of [facility] to verify eligibility for Olivia Randall. Spoke with Olivia Randall, who states that the home seemed an unsafe environment.  She will contact nursing and SW.    Length of Stay: 3  Current Medications: Scheduled Meds:  . chlorhexidine  15 mL Mouth Rinse BID  .  donepezil  10 mg Oral QHS  . enoxaparin (LOVENOX) injection  30 mg Subcutaneous Q24H  . feeding supplement (ENSURE ENLIVE)  237 mL Oral BID BM  . FLUoxetine  40 mg Oral Daily  . fluticasone furoate-vilanterol  1 puff Inhalation Daily  . furosemide  40 mg Oral Daily  . ipratropium-albuterol  3 mL Nebulization Q6H  . levofloxacin  500 mg Oral Daily  . mouth rinse  15 mL Mouth Rinse q12n4p  . methylPREDNISolone (SOLU-MEDROL) injection  60 mg Intravenous Q12H  . pantoprazole  80 mg Oral Q1200    Continuous Infusions:   PRN Meds: albuterol, ALPRAZolam, ondansetron **OR** ondansetron (ZOFRAN) IV, oxyCODONE-acetaminophen  Physical Exam  Constitutional: No distress.  Appears frail and thin, chronically ill. Makes but does not keep eye contact, pleasantly confused  HENT:  Head: Atraumatic.  Cardiovascular: Normal rate and regular rhythm.   Pulmonary/Chest: Effort normal. No respiratory distress.  Abdominal: Soft. She exhibits no distension.  Musculoskeletal: She exhibits no edema.  Muscle wasting, frail  Neurological: She is alert.  Oriented to self only, known dementia  Skin: Skin is warm and dry.  Nursing note and vitals reviewed.           Vital Signs: BP (!) 149/71   Pulse 80   Temp 98.3 F (36.8 C) (Oral)   Resp (!) 22   Ht 5\' 4"  (1.626 m)   Wt 41.1 kg (90 lb 9.7 oz)   SpO2 99%   BMI 15.55 kg/m  SpO2: SpO2: 99 % O2 Device: O2  Device: Nasal Cannula O2 Flow Rate: O2 Flow Rate (L/min): 2 L/min  Intake/output summary:  Intake/Output Summary (Last 24 hours) at 04/05/17 1206 Last data filed at 04/05/17 0900  Gross per 24 hour  Intake              460 ml  Output              300 ml  Net              160 ml   LBM: Last BM Date: 04/02/17 Baseline Weight: Weight: 43.5 kg (95 lb 14.4 oz) Most recent weight: Weight: 41.1 kg (90 lb 9.7 oz)       Palliative Assessment/Data:    Flowsheet Rows     Most Recent Value  Intake Tab  Referral Department  Hospitalist  Unit at Time of Referral  Med/Surg Unit  Palliative Care Primary Diagnosis  Pulmonary  Date Notified  04/03/17  Palliative Care Type  New Palliative care  Reason for referral  Clarify Goals of Care  Date of Admission  04/02/17  Date first seen by Palliative Care  04/04/17  # of days Palliative referral response time  1 Day(s)  # of days IP prior to Palliative referral  1  Clinical Assessment  Palliative Performance Scale Score  30%  Pain Max last 24 hours  Not able to report  Pain Min Last 24 hours  Not able to report  Dyspnea Max Last 24 Hours  Not able to report  Dyspnea Min Last 24 hours  Not able to report  Psychosocial & Spiritual Assessment  Palliative Care Outcomes  Patient/Family meeting held?  Yes  Who was at the meeting?  patient at bedside, sister Olivia Randall via phone, Olivia Randall/Olivia Randall Olivia Randall and daughter Olivia Randall at bedside  [place] regarding hospice, Provided psychosocial or spiritual support, Clarified goals of care  Patient/Family wishes: Interventions discontinued/not started   Mechanical Ventilation  Patient Active Problem List   Diagnosis Date Noted  . Palliative care encounter   . Goals of care, counseling/discussion   . Encounter for hospice care discussion   . Acute on chronic respiratory failure ([place]) 04/02/2017  . Severe protein-calorie malnutrition Olivia Randall: less than 60% of  standard weight) ([place]) 04/02/2017  . Malnutrition of moderate degree 03/12/2017  . Encounter for orogastric (OG) tube placement   . Encounter for intubation   . COPD exacerbation ([place]) 03/10/2017  . Pressure injury of skin 03/10/2017  . Acute respiratory failure ([place]) 01/23/2017  . Altered mental status 01/23/2017  . Normocytic anemia 01/23/2017  . Acute respiratory failure with hypercapnia ([place]) 09/05/2016  . Essential hypertension 11/08/2015  . Mucosal abnormality of stomach   . Hyponatremia 03/18/2015  . Sleep apnea   . Anxiety and depression   . Dysphagia, pharyngoesophageal phase 02/18/2015  . OSA (obstructive sleep apnea) 05/20/2013  . Tobacco use disorder 05/20/2013  . Dizzy 12/04/2012  . COPD with acute exacerbation ([place]) 12/03/2012  . Chronic respiratory failure ([place]) 12/03/2012  . Hypokalemia 12/03/2012  . Chronic back pain 12/03/2012  . GERD (gastroesophageal reflux disease) 12/03/2012    Palliative Care Assessment & Plan   Patient Profile: 66 y.o. female  with past medical history of Dementia, anxiety and depression, COPD, GE RD, CHF, recent hospitalization 7/034 COPD exacerbation requiring intubation admitted on 04/02/2017 with acute on chronic respiratory failure with COPD exacerbation and suspected metastatic lung cancer with burden to liver, frailty.   Assessment: Acute on chronic respiratory failure. COPD exacerbation:   Mrs. Akey was hospitalized and ventilated within the last month. She's decided that she does not want intubation/ventilation again. She is improved with her respiratory status, and there's questions about her continued smoking at home. Her respiratory viral panel was negative. She is on steroid taper, and will continue Levaquin by mouth for 5 additional days. Suspect metastatic lung cancer, Interim finding of a 2.9 cm low density mass in the left hepatic lobe: both Mrs. Manukyan and her [facility] Randall, Olivia Randall Olivia Randall Randall desire no further workup  for suspected metastatic cancer burden. They are instead, wishing to focus on hospice at home.  Recommendations/Plan:  family is requesting home with the benefit of hospice of [facility]. They are not seeking further workup for cancer, no cancer treatment.  Goals of Care and Additional Recommendations:  Limitations on Scope of Treatment: No Chemotherapy and Treat the treatable, such as UTIs, but no CPR or intubation. No workup for cancer.  Code Status:    Code Status Orders        Start     Ordered   04/02/17 1907  Do not attempt resuscitation (DNR)  Continuous    Question Answer Comment  In the event of cardiac or respiratory ARREST Do not call a "code blue"   In the event of cardiac or respiratory ARREST Do not perform Intubation, CPR, defibrillation or ACLS   In the event of cardiac or respiratory ARREST Use medication by any route, position, wound care, and other measures to relive pain and suffering. May use oxygen, suction and manual treatment of airway obstruction as needed for comfort.      04/02/17 1909    Code Status History    Date Active Date Inactive Code Status Order ID Comments User Context   03/10/2017  1:50 PM 03/16/2017  7:02 AM Full Code [id number]  Germain Osgood, PA-C Inpatient   01/24/2017  2:03 AM 01/27/2017  9:10 PM Full Code  [id number]  Edwin Dada, MD Inpatient   09/10/2016 12:50 PM 09/14/2016  3:00 PM Partial Code [id number]  Rush Farmer, MD Inpatient   09/05/2016  6:57 AM 09/10/2016 12:50 PM Full Code [id number]  Beverely Low, MD Inpatient   11/08/2015  5:25 PM 11/10/2015  4:54 PM Full Code [id number]  Erline Hau, MD Inpatient   03/18/2015 12:11 AM 03/20/2015  2:46 PM Full Code [id number]  Oswald Hillock, MD Inpatient   05/20/2013  9:48 PM 05/22/2013  7:18 PM Full Code [id number]  Karlyn Agee, MD Inpatient   12/03/2012  6:02 PM 12/05/2012  3:02 PM Full Code [id number]  Samuella Cota, MD Inpatient       Prognosis:   < 3 months  would not be surprising based on frailty, functional decline, suspected metastatic lung cancer with burden to liver.  Discharge Planning:  Family is requesting home with the benefits of hospice of [facility].   Care plan was discussed with nursing staff, case manager, social worker, and Dr. Allyson Sabal on next rounds.   Thank you for allowing the Palliative Medicine Team to assist in the care of this patient.   Time In: 1140 Time Out: 1220 Total Time 40 minutes Prolonged Time Billed  no       Greater than 50%  of this time was spent counseling and coordinating care related to the above assessment and plan.  Drue Novel, NP  Please contact Palliative Medicine Team phone at [phone] for questions and concerns.

## 2017-04-05 NOTE — Evaluation (Signed)
Physical Therapy Evaluation Patient Details Name: Olivia Randall MRN: [id number] DOB: 1951-07-23 Today's Date: 04/05/2017   History of Present Illness  66 y.o.femalewith medical history significant of ancxiety/depression, COPD, GERD, HTN, MS, CHF, dementia. Patient was discharged from his current hospital on 03/15/2017 after admission for COPD exacerbation requiring intubation. Patient has gone from a skilled nursing facility/rehabilitation facility to different family members. Patient currently presenting to independent hospital from her sister's home who has been her most recent and closest caretaker. Patient presented with somnolence last several days. Patient admitted for COPD exacerbation. Palliative care consulted for suspicion for metastatic lung cancer. Family opted for conservative management  Clinical Impression  PT is able to complete bed mobility, transfers and ambulate with RW and O2 with moderate  Independence.  Pt becomes SOB easily and will need to take frequent short walks to improve  Activity tolerance.     Follow Up Recommendations Home health PT    Equipment Recommendations    none   Recommendations for Other Services   none    Precautions / Restrictions Precautions Precautions: None Restrictions Weight Bearing Restrictions: No      Mobility  Bed Mobility Overal bed mobility: Modified Independent       Supine to sit: Modified independent (Device/Increase time) Sit to supine: Modified independent (Device/Increase time)      Transfers Overall transfer level: Modified independent   Transfers: Sit to/from Stand Sit to Stand: Independent            Ambulation/Gait Ambulation/Gait assistance: Modified independent (Device/Increase time) Ambulation Distance (Feet): 20 Feet Assistive device: Rolling walker (2 wheeled) Gait Pattern/deviations: Step-through pattern   Gait velocity interpretation: <1.8 ft/sec, indicative of risk for recurrent  falls    Stairs                    Pertinent Vitals/Pain Pain Assessment: No/denies pain    Home Living Family/patient expects to be discharged to:: Private residence Living Arrangements: Other relatives Available Help at Discharge: Family Type of Home: House Home Access: Stairs to enter Entrance Stairs-Rails: Right Entrance Stairs-Number of Steps: 2 Home Layout: One level Home Equipment: Environmental consultant - 2 wheels      Prior Function Level of Independence: Needs assistance   Gait / Transfers Assistance Needed: mod I with RW  ADL's / Homemaking Assistance Needed: needs assist        Hand Dominance        Extremity/Trunk Assessment        Lower Extremity Assessment Lower Extremity Assessment: Generalized weakness       Communication   Communication: No difficulties  Cognition Arousal/Alertness: Awake/alert Behavior During Therapy: WFL for tasks assessed/performed Overall Cognitive Status: History of cognitive impairments - at baseline                                        General Comments      Exercises General Exercises - Lower Extremity Ankle Circles/Pumps: Both;10 reps Quad Sets: Both;10 reps Straight Leg Raises: Both;20 reps   Assessment/Plan    PT Assessment Patient needs continued PT services  PT Problem List Decreased strength;Decreased activity tolerance       PT Treatment Interventions Gait training;Therapeutic exercise;Therapeutic activities    PT Goals (Current goals can be found in the Care Plan section)       Frequency Min 3X/week   Barriers to discharge  End of Session Equipment Utilized During Treatment: Gait belt Activity Tolerance: Treatment limited secondary to medical complications (Comment) (PT becomes SOB with minimal exertion) Patient left: in chair;with call bell/phone within reach Nurse Communication: Mobility status PT Visit Diagnosis: Unsteadiness on feet (R26.81);Muscle weakness  (generalized) (M62.81)    Time: 1000-1021 PT Time Calculation (min) (ACUTE ONLY): 21 min   Charges:   PT Evaluation $PT Eval Low Complexity: 1 Procedure            Rayetta Humphrey, PT CLT [phone] 04/05/2017, 10:26 AM

## 2017-04-05 NOTE — Progress Notes (Signed)
**Note De-Identified  Obfuscation** Patient removed from BIPAP and placed on 2 L [place].  Patient tolerating well.   RRT to continue to monitor.

## 2017-04-05 NOTE — Progress Notes (Addendum)
[facility] PROGRESS NOTE  MURRY KHIEV TMA:[id number] DOB: [birth year] DOA: 04/02/2017 PCP: Octavio Graves, DO  Brief summary   66 y.o. female with medical history significant of ancxiety/depression, COPD, GERD, HTN, MS,  CHF, dementia. Patient was discharged from his current hospital on 03/15/2017 after admission for COPD exacerbation requiring intubation. Patient has gone from a skilled nursing facility/rehabilitation facility to different family members. Patient currently presenting to independent hospital from her sister's home who has been her most recent and closest caretaker. Patient presented with somnolence last several days. Patient admitted for COPD exacerbation. Palliative care consulted for suspicion for metastatic lung cancer. Family opted for conservative management    Assessment/Plan:  Acute on chronic respiratory failure. COPD exacerbation. Initial ABG showing pH 7.19, PCO2 1:15, PaO2 128, bicarbonate 34.9.  -ABG improved. Off BiPAP  during the day , cont bronchodilators, started tapering steroids, Levaquin to PO 5 more days, negative Respiratory viral panel   Suspect metastatic lung cancer, end stage copd, consulted palliative care for goals of care . Family opted for no lung biopsy, no chemotherapy, understanding poor functional status, opted to be a DNR/DNI . Patient may need  hospice as well.  -CT scan showed spiculated mass in the left upper lobe measuring 2.4 cm concerning for malignancy. small pulmonary nodules in the posterior left lower lobe concerning for metastatic nodules. Interim finding of a 2.9 cm low density mass in the left hepatic lobe, concerning for metastatic disease. Interval finding of lytic and sclerotic lesions within the T11, T12and L2 vertebral bodies, also concerning for metastatic disease. This was discussed with the daughter   Chronic diastolic congestive heart failure: Last echo showing EF 60% and grade 1 diastolic disruption. No evidence  of acute decompensation - continue lasix. strict I/O, daily wts  Severe protein calorie malnutrition: Patient weighs 94 pounds. Very low oral intake over the last several weeks. - PT/OT/nutrition consult, pre-albumin   hypomagnesemia-replete and recheck  Hyperglycemia, ? Steroid induce. Hemoglobin A1c 4.8  Tobacco use: 1-2 per day  Chronic pain: continue percocet  Depression/anxiety:  continue prozac, xanax  Dementia: continue aricept  GERD: continue PPI   Code Status: DNR Family Communication: d/w patient, RN. Discussed with daughter  (indicate person spoken with, relationship, and if by phone, the number) Disposition Plan:  Transfer to [place],  SNF with palliative care versus home with hospice   Consultants:  Palliative care  Procedures:  BiPAP  Antibiotics: Anti-infectives    Start     Dose/Rate Route Frequency Ordered Stop   04/04/17 1000  levofloxacin (LEVAQUIN) tablet 500 mg     500 mg Oral Daily 04/04/17 0839     04/03/17 2000  levofloxacin (LEVAQUIN) IVPB 500 mg  Status:  Discontinued     500 mg 100 mL/hr over 60 Minutes Intravenous Every 24 hours 04/02/17 2013 04/04/17 0839   04/02/17 2000  levofloxacin (LEVAQUIN) IVPB 500 mg     500 mg 100 mL/hr over 60 Minutes Intravenous  Once 04/02/17 1916 04/02/17 2140       (indicate start date, and stop date if known)  HPI/Subjective:  Currently on 2 L of nasal cannula, still appears to be frail, tachypneic, short of breath  Objective: Vitals:   04/05/17 0500 04/05/17 0600  BP:  (!) 149/71  Pulse: 76 77  Resp: 18 16  Temp:      Intake/Output Summary (Last 24 hours) at 04/05/17 0856 Last data filed at 04/04/17 1432  Gross per 24 hour  Intake  360 ml  Output              300 ml  Net               60 ml   Filed Weights   04/03/17 0500 04/03/17 1013 04/04/17 0500  Weight: 44.3 kg (97 lb 10.6 oz) 44.7 kg (98 lb 8.7 oz) 43.1 kg (95 lb 0.3 oz)    Exam:   General:  Alert,  oriented to person, place  Cardiovascular: s1,s2 rrr  Respiratory: diminished BL  Abdomen: soft, nt   Musculoskeletal: no leg edema   Data Reviewed: Basic Metabolic Panel:  Recent Labs Lab 04/02/17 1658 04/02/17 1910 04/03/17 0541 04/04/17 0412  NA 140  --  136  --   K 4.6  --  4.4  --   CL 92*  --  89*  --   CO2 44*  --  40*  --   GLUCOSE 120*  --  112*  --   BUN 7  --  10  --   CREATININE 0.35*  --  0.45  --   CALCIUM 10.5*  --  10.0  --   MG  --  1.5*  --  1.3*  PHOS  --  3.6  --   --    Liver Function Tests:  Recent Labs Lab 04/02/17 1658 04/03/17 0541  AST 13* 13*  ALT 13* 11*  ALKPHOS 136* 113  BILITOT 0.6 0.7  PROT 6.2* 5.4*  ALBUMIN 3.1* 2.7*   No results for input(s): LIPASE, AMYLASE in the last 168 hours.  Recent Labs Lab 04/02/17 1658  AMMONIA 52*   CBC:  Recent Labs Lab 04/02/17 1658 04/03/17 0541 04/05/17 0544  WBC 7.2 3.4* 8.6  NEUTROABS 6.3  --   --   HGB 12.0 10.7* 11.6*  HCT 41.3 36.4 35.8*  MCV 92.4 90.5 82.9  PLT 227 218 218   Cardiac Enzymes:  Recent Labs Lab 04/02/17 1658  TROPONINI <0.03   BNP (last 3 results)  Recent Labs  09/05/16 0033 10/26/16 0526  BNP 36.0 11.0    ProBNP (last 3 results) No results for input(s): PROBNP in the last 8760 hours.  CBG: No results for input(s): GLUCAP in the last 168 hours.  Recent Results (from the past 240 hour(s))  Respiratory Panel by PCR     Status: None   Collection Time: 04/02/17  8:00 PM  Result Value Ref Range Status   Adenovirus NOT DETECTED NOT DETECTED Final   Coronavirus 229E NOT DETECTED NOT DETECTED Final   Coronavirus HKU1 NOT DETECTED NOT DETECTED Final   Coronavirus NL63 NOT DETECTED NOT DETECTED Final   Coronavirus OC43 NOT DETECTED NOT DETECTED Final   Metapneumovirus NOT DETECTED NOT DETECTED Final   Rhinovirus / Enterovirus NOT DETECTED NOT DETECTED Final   Influenza A NOT DETECTED NOT DETECTED Final   Influenza B NOT DETECTED NOT DETECTED  Final   Parainfluenza Virus 1 NOT DETECTED NOT DETECTED Final   Parainfluenza Virus 2 NOT DETECTED NOT DETECTED Final   Parainfluenza Virus 3 NOT DETECTED NOT DETECTED Final   Parainfluenza Virus 4 NOT DETECTED NOT DETECTED Final   Respiratory Syncytial Virus NOT DETECTED NOT DETECTED Final   Bordetella pertussis NOT DETECTED NOT DETECTED Final   Chlamydophila pneumoniae NOT DETECTED NOT DETECTED Final   Mycoplasma pneumoniae NOT DETECTED NOT DETECTED Final    Comment: Performed at [facility], [address], [place], [place] [address]  MRSA PCR Screening  Status: None   Collection Time: 04/02/17  8:00 PM  Result Value Ref Range Status   MRSA by PCR NEGATIVE NEGATIVE Final    Comment:        The GeneXpert MRSA Assay (FDA approved for NASAL specimens only), is one component of a comprehensive MRSA colonization surveillance program. It is not intended to diagnose MRSA infection nor to guide or monitor treatment for MRSA infections.      Studies: No results found.  Scheduled Meds: . chlorhexidine  15 mL Mouth Rinse BID  . donepezil  10 mg Oral QHS  . enoxaparin (LOVENOX) injection  30 mg Subcutaneous Q24H  . feeding supplement (ENSURE ENLIVE)  237 mL Oral BID BM  . FLUoxetine  40 mg Oral Daily  . fluticasone furoate-vilanterol  1 puff Inhalation Daily  . furosemide  40 mg Oral Daily  . ipratropium-albuterol  3 mL Nebulization Q6H  . levofloxacin  500 mg Oral Daily  . mouth rinse  15 mL Mouth Rinse q12n4p  . methylPREDNISolone (SOLU-MEDROL) injection  60 mg Intravenous Q12H  . pantoprazole  80 mg Oral Q1200   Continuous Infusions: . magnesium sulfate 1 - 4 g bolus IVPB      Active Problems:   Chronic back pain   Tobacco use disorder   Anxiety and depression   COPD exacerbation (H[facility])   Acute on chronic respiratory failure ([place])   Severe protein-calorie malnutrition ([place]: less than 60% of standard weight) ([place])   Palliative care encounter   Goals of  care, counseling/discussion   Encounter for hospice care discussion    Time spent: > 35 minutes     Ellanie Oppedisano  [facility]  . If 7PM-7AM, please contact night-coverage at [email or web address], password [facility] 04/05/2017, 8:56 AM  LOS: 3 days

## 2017-04-05 NOTE — Progress Notes (Signed)
Pt arrived to room 331 from the ICU. Pt oriented to room and call bell. Oxygen applied at 2L. No distress noted.

## 2017-04-05 NOTE — Progress Notes (Signed)
PT IS BEING TRANSFERRED TO ROOM 331 ON TELEMETRY.HR 84 IN NRS. O2 AT 3L/MIN VIA [place].O2 SAT 100%. BREATH SOUNDS DIMINISHED. SOB W/ EXERTION. LT LOWER FOREARM IV NSL PATENT. TRANSFER REPORT CALLED TO BONNIE RN ON 300.

## 2017-04-05 NOTE — Care Management (Addendum)
Patient Information  SS# [id number]  Patient Name Olivia Randall, Olivia Randall ([id number]) Sex Female DOB 04/29/1951  Room Bed  IC09 IC09-01  Patient Demographics   Address [address] [place] [address] Phone [phone] (Home)  Patient Ethnicity & Race   Ethnic Group Patient Race  Not Hispanic or Latino White or Caucasian  Emergency Contact(s)   Name Relation Home Work Mobile  [place] Sister [phone]    Dareen Piano   [phone]  Documents on File    Status Date Received Description  Documents for the Patient  Driver's License Not Received    Historic Radiology Documentation Not Received    Historic Radiology Documentation Not Received    [place] Received 09/13/11 315  [place] E-Signature HIPAA Notice of Privacy Not Received    [place] E-Signature HIPAA Notice of Privacy Spanish Not Received    Insurance Card Received 09/13/11 315  Advance Directives/Living Will/HCPOA/POA Not Received    Financial Application Not Received    [place] HIPAA NOTICE OF PRIVACY - Scanned Not Received    Insurance Card Not Received    Insurance Card Received 12/02/13   HIM ROI Authorization  03/12/14   HIM ROI Authorization  03/14/14   Other Photo ID Not Received    Insurance Card Received 04/18/15   HIM ROI Authorization (Expired) 02/21/15 EGD report by Dr. Laural Golden and path if done.  HIM ROI Authorization  02/26/15   HIM ROI Authorization  03/31/15   Advanced Beneficiary Notice (ABN) Not Received    E-Signature AOB Spanish Not Received    Insurance Card Received 04/29/16 [facility]/AETNA  AMB Outside Hospital Record  07/21/16 D/S [place] E-Signature HIPAA Notice of Privacy Signed 10/26/16   Release of Information Not Received    AMB Correspondence  11/03/16 NOTICE OF APPROVAL LETTER AETNA  AMB Correspondence  11/23/16 NOTICE OF APPROVAL AETNA  HIM ROI Authorization  02/22/17 IP  STAY RECORDS REQUESTED FOR DOS 05.13.18-05.17.18 BY AETNA  Advance Directives/Living Will/HCPOA/POA  03/17/17   Advance Directives/Living Will/HCPOA/POA  03/17/17   Advance Directives/Living Will/HCPOA/POA  03/21/17   Release of Information Received 04/02/17 [facility]  Documents for the Encounter  AOB (Assignment of Insurance Benefits) Not Received    E-signature AOB Signed 04/02/17   MEDICARE RIGHTS Not Received    E-signature Medicare Rights Signed 04/02/17   Cardiac Monitoring Strip Shift Summary  04/02/17   Cardiac Monitoring Strip  04/03/17   EKG  04/04/17   Lab Result Scan  04/04/17   Admission Information   Attending Provider Admitting Provider Admission Type Admission Date/Time  Reyne Dumas, MD Waldemar Dickens, MD Emergency 04/02/17 1636  Discharge Date Hospital Service Auth/Cert Status Service Area   Internal Medicine Incomplete [place]  Unit Room/Bed Admission Status   AP-ICCUP NURSING IC09/IC09-01 Admission (Confirmed)   Admission   Complaint  Altered  Hospital Account   Name Acct ID Class Status Primary [place] [id number] Inpatient Open [place]      Guarantor Account (for Hospital Account [id number])   Name Relation to Pt Service Area Active? Acct Type  [place] Yes Personal/Family  Address Phone    [address], [place] [address] [phone])        Coverage Information (for Hospital Account [id number])   F/O Payor/Plan Precert #  [place] MEDICARE HMO/PPO  Subscriber Subscriber #  Paityn, B[place] [facility]  Address Phone  [address] [place] [place] [address]

## 2017-04-06 ENCOUNTER — Telehealth: Payer: Self-pay | Admitting: *Deleted

## 2017-04-06 DIAGNOSIS — J9621 Acute and chronic respiratory failure with hypoxia: Secondary | ICD-10-CM

## 2017-04-06 DIAGNOSIS — E43 Unspecified severe protein-calorie malnutrition: Secondary | ICD-10-CM

## 2017-04-06 DIAGNOSIS — J9622 Acute and chronic respiratory failure with hypercapnia: Secondary | ICD-10-CM

## 2017-04-06 DIAGNOSIS — M5440 Lumbago with sciatica, unspecified side: Secondary | ICD-10-CM

## 2017-04-06 DIAGNOSIS — G8929 Other chronic pain: Secondary | ICD-10-CM

## 2017-04-06 LAB — MAGNESIUM: MAGNESIUM: 1.9 mg/dL (ref 1.7–2.4)

## 2017-04-06 LAB — CBC
HCT: 34.9 % — ABNORMAL LOW (ref 36.0–46.0)
HEMOGLOBIN: 11 g/dL — AB (ref 12.0–15.0)
MCH: 25.9 pg — AB (ref 26.0–34.0)
MCHC: 31.5 g/dL (ref 30.0–36.0)
MCV: 82.1 fL (ref 78.0–100.0)
PLATELETS: 198 10*3/uL (ref 150–400)
RBC: 4.25 MIL/uL (ref 3.87–5.11)
RDW: 14.2 % (ref 11.5–15.5)
WBC: 7.2 10*3/uL (ref 4.0–10.5)

## 2017-04-06 MED ORDER — OXYCODONE-ACETAMINOPHEN 5-325 MG PO TABS: 1.0000 | ORAL_TABLET | Freq: Three times a day (TID) | ORAL | 0 refills | Status: AC | PRN

## 2017-04-06 MED ORDER — ALPRAZOLAM 0.5 MG PO TABS: 0.5000 mg | ORAL_TABLET | Freq: Two times a day (BID) | ORAL | 1 refills | Status: AC | PRN

## 2017-04-06 MED ORDER — PREDNISONE 10 MG PO TABS: 10.0000 mg | ORAL_TABLET | Freq: Every day | ORAL | 0 refills | Status: AC

## 2017-04-06 MED ORDER — IPRATROPIUM-ALBUTEROL 0.5-2.5 (3) MG/3ML IN SOLN
3.0000 mL | Freq: Three times a day (TID) | RESPIRATORY_TRACT | Status: DC
  Administered 2017-04-06: 3 mL via RESPIRATORY_TRACT
  Filled 2017-04-06: qty 3

## 2017-04-06 NOTE — Telephone Encounter (Signed)
Spoke with sister and found out all this information

## 2017-04-06 NOTE — Discharge Summary (Signed)
Physician Discharge Summary  [facility] [place] Olivia Randall DOB: 12/02/1950 DOA: 04/02/2017  PCP: Olivia Graves, DO  Admit date: 04/02/2017 Discharge date: 04/06/2017  Time spent: 45 minutes  Recommendations for Outpatient Follow-up:  -Will be discharged home today with hospice services.   Discharge Diagnoses:  Active Problems:   Chronic back pain   Tobacco use disorder   Anxiety and depression   COPD exacerbation (HCC)   Acute on chronic respiratory failure (HCC)   Severe protein-calorie malnutrition Olivia Randall: less than 60% of standard weight) (Olivia Randall)   Palliative care encounter   Goals of care, counseling/discussion   Encounter for hospice care discussion   Discharge Condition: Guarded  Filed Weights   04/04/17 0500 04/05/17 0800 04/06/17 0359  Weight: 43.1 kg (95 lb 0.3 oz) 41.1 kg (90 lb 9.7 oz) 44.2 kg (97 lb 7.1 oz)    History of present illness:  As per Dr. Marily Randall 7/21:  Olivia Randall is a 66 y.o. female with medical history significant of ancxiety/depression, COPD, GERD, HTN, MS,  CHF, dementia. Patient was discharged from his current hospital on 03/15/2017 after admission for COPD exacerbation requiring intubation. Patient has gone from a skilled nursing facility/rehabilitation facility to different family members. Patient currently presenting to independent hospital from her sister's home who has been her most recent and closest caretaker. Family reports several week history of decreased energy and interest in eating with associated weight loss. Her story status has been at her baseline up until the morning of admission the patient was on her usual home O2 and had no reported fevers, chest pain, shortness of breath, palpitations, nausea, vomiting, abdominal pain, dysuria, frequency. Patient only complaint has been that she has felt a little more sleepy over the last several days. Patient saw her primary care physician one day ago without any respiratory complaints. Per  family report on Olivia Randall admission she was found to be extremely lethargic and difficult to wake up. There are no focal deficits on their exam area no reported confusion part because patient would not wake up long enough to say whether 1 or 2 words.  Hospital Course:   Acute on chronic hypoxemic and hypercarbic respiratory failure -Due to COPD with acute exacerbation. -No wheezing on exam today, will discharge home on prednisone taper, has completed Levaquin course.  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 Also found to have lytic sclerotic lesions within the T11, T12 and L2 vertebral bodies, also concerning for metastatic disease.  -After discussion with family members they have opted for no lung biopsy, no chemotherapy no treatment, have agreed to DO NOT RESUSCITATE/DO NOT INTUBATE. -Hospice services have been arranged. -4 chronic pain we'll continue her Percocet.  Severe protein caloric malnutrition -She only weighs 90 pounds.  Chronic diastolic CHF -Last echo with ejection fraction of 60% and grade 1 diastolic dysfunction. -No evidence of decompensation.  Dementia  -Continue Aricept   Procedures:  None   Consultations:  Palliative care   Discharge Instructions  Discharge Instructions    Increase activity slowly    Complete by:  As directed      Allergies as of 04/06/2017      Reactions   Clopidogrel Other (See Comments)   Confusion and behavioral changes (altered)      Medication List    TAKE these medications   acetaminophen 325 MG tablet Commonly known as:  TYLENOL Take 2 tablets (650 mg total) by mouth every 6 (six) hours as needed for mild  pain, moderate pain, fever or headache (or Fever >/= 101).   ALPRAZolam 0.5 MG tablet Commonly known as:  XANAX Take 1 tablet (0.5 mg total) by mouth 2 (two) times daily as needed for anxiety.   donepezil 10 MG tablet Commonly known as:  ARICEPT Take 10 mg by mouth at bedtime.   esomeprazole  40 MG capsule Commonly known as:  NEXIUM Take 1 capsule (40 mg total) by mouth daily.   FLUoxetine 40 MG capsule Commonly known as:  PROZAC Take 1-2 capsules (40-80 mg total) by mouth daily.   fluticasone furoate-vilanterol 200-25 MCG/INH Aepb Commonly known as:  BREO ELLIPTA Inhale 1 puff into the lungs daily.   furosemide 40 MG tablet Commonly known as:  LASIX Take 1 tablet (40 mg total) by mouth daily.   ipratropium-albuterol 0.5-2.5 (3) MG/3ML Soln Commonly known as:  DUONEB Take 3 mLs by nebulization 3 (three) times daily.   oxyCODONE-acetaminophen 5-325 MG tablet Commonly known as:  ROXICET Take 1 tablet by mouth every 8 (eight) hours as needed for severe pain. Chronic pain   predniSONE 10 MG tablet Commonly known as:  DELTASONE Take 1 tablet (10 mg total) by mouth daily with breakfast. Take 6 tablets today and then decrease by 1 tablet daily until none are left.      Allergies  Allergen Reactions  . Clopidogrel Other (See Comments)    Confusion and behavioral changes (altered)      The results of significant diagnostics from this hospitalization (including imaging, microbiology, ancillary and laboratory) are listed below for reference.    Significant Diagnostic Studies: Ct Chest W Contrast  Result Date: 04/02/2017 CLINICAL DATA:  Decreased energy, weight loss, fever, chest pain, shortness of breath EXAM: CT CHEST WITH CONTRAST TECHNIQUE: Multidetector CT imaging of the chest was performed during intravenous contrast administration. CONTRAST:  12mL ISOVUE-300 IOPAMIDOL (ISOVUE-300) INJECTION 61% COMPARISON:  04/02/2017, CT chest 12/05/2016, 01/17/2013 FINDINGS: Cardiovascular: Atherosclerotic calcifications within the aorta. No aneurysmal dilatation. Normal heart size. No significant pericardial effusion. Mediastinum/Nodes: Mediastinal adenopathy re- demonstrated with 14 mm AP window lymph node as before. Increased size of a lymph node adjacent to the great vessels,  measuring 11 mm. Subcentimeter hypodense right thyroid nodule. Midline trachea. Air distention of the esophagus. Otherwise unremarkable. Lungs/Pleura: Moderate emphysema. Spiculated left upper lobe lung nodule measuring 2.4 x 1.5 by 1.9 cm, not significantly changed in size. Development of additional pulmonary nodules within the posterior left lower lobe measuring 8 mm and 5 mm. Bronchiectasis and scarring in the medial right lung base. No pleural effusion. Mild tree-in-bud pattern right middle lobe unchanged. Upper Abdomen: Interval finding of a low-density mass measuring approximately 2.9 cm within the left hepatic lobe. Surgical clips in the gallbladder fossa. subcentimeter hypodensity left kidney, too small to further characterize. Adrenal glands are within normal limits. Musculoskeletal: Development of large lucent lesion in L2 vertebral body. Interim finding of mixed lucent and sclerotic lesion in T12 and T11. Mild wedging of T7 unchanged. Degenerative changes elsewhere in the spine. Partially visualized surgical plate and screw fixation of the lower cervical spine. IMPRESSION: 1. Grossly stable spiculated mass in the left upper lobe measuring 2.4 cm concerning for malignancy. Development of small pulmonary nodules in the posterior left lower lobe concerning for metastatic nodules. Slight increased adenopathy in the mediastinum since the prior CT. PET-CT previously recommended for further evaluation. 2. Interim finding of a 2.9 cm low density mass in the left hepatic lobe, concerning for metastatic disease. 3. Interval finding of lytic  and sclerotic lesions within the T11, T12 and L2 vertebral bodies, also concerning for metastatic disease. 4. Moderate emphysema Aortic Atherosclerosis (ICD10-I70.0) and Emphysema (ICD10-J43.9). Electronically Signed   By: Donavan Foil M.D.   On: 04/02/2017 20:18   Dg Chest Port 1 View  Result Date: 04/02/2017 CLINICAL DATA:  Shortness of breath EXAM: PORTABLE CHEST 1 VIEW  COMPARISON:  Multiple priors, most recently 03/13/2017 FINDINGS: Blunting of the right costophrenic angle, possibly chronic pleural thickening versus a small right pleural effusion. Increased interstitial markings without frank interstitial edema. Left lung is clear. No pneumothorax. Heart is normal in size. Cervical spine fixation hardware. IMPRESSION: Chronic blunting of the right costophrenic angle, possibly pleural thickening versus a small pleural effusion. No evidence of acute cardiopulmonary disease. Electronically Signed   By: Julian Hy M.D.   On: 04/02/2017 17:32   Dg Chest Port 1 View  Result Date: 03/13/2017 CLINICAL DATA:  Ventilator support.  Endotracheal removal. EXAM: PORTABLE CHEST 1 VIEW COMPARISON:  03/11/2017 FINDINGS: Endotracheal tube and nasogastric tube have been removed. Background pattern of chronic pulmonary scarring. Chronic volume loss at the right base. Probable small right effusion. Left perihilar mass lesion visible on the previous CT not specifically visible by radiography. IMPRESSION: Chronic interstitial markings. Chronic volume loss at the right base. Possible small amount of pleural fluid on the right. Perihilar left upper lobe mass lesion shown at previous CT not visible by radiography. Electronically Signed   By: Nelson Chimes M.D.   On: 03/13/2017 10:32   Dg Chest Port 1 View  Result Date: 03/11/2017 CLINICAL DATA:  Respiratory failure. EXAM: PORTABLE CHEST 1 VIEW COMPARISON:  03/10/2017, 03/10/2017.  CT 12/05/2016 . FINDINGS: Endotracheal tube has been withdrawn minimally common the endotracheal tube remains in a low position its tip is 7 mm above the carina. Heart size stable. Stable chronic interstitial changes. Stable pleural-parenchymal thickening consistent with scarring . Reference is made to priors chest CT report 12/05/2016 for discussion of small ill-defined density in the left upper lung. No pneumothorax . No acute bony abnormality. IMPRESSION: 1.  Endotracheal tube has been withdrawn minimally, endotracheal tube remains in a low position with its tip 7 mm above the carina. NG tube in stable position. 2. Chronic interstitial changes with pleural-parenchymal scarring. 3. Reference is made to prior chest CT report 12/05/2016 for discussion of small ill-defined left upper lobe lesion. These results will be called to the ordering clinician or representative by the Radiologist Assistant, and communication documented in the PACS or zVision Dashboard. Electronically Signed   By: Marcello Moores  Register   On: 03/11/2017 06:27   Dg Chest Port 1 View  Result Date: 03/10/2017 CLINICAL DATA:  Respiratory failure, intubated patient. EXAM: PORTABLE CHEST 1 VIEW COMPARISON:  Portable chest x-ray of March 10, 2017 at 10:43 a.m. FINDINGS: The lungs are hyperinflated with hemidiaphragm flattening. There are small bilateral pleural effusions. The heart and pulmonary vascularity are normal. There is calcification in the wall of the aortic arch. The endotracheal tube tip lies at the origin of the right mainstem bronchus. The esophagogastric tube tip projects below the inferior margin of the image. IMPRESSION: Again demonstrated is low positioning of the endotracheal tube. Withdrawal by 3-4 cm is recommended to avoid accidental right mainstem bronchus intubation. The esophagogastric tube tip projects below the inferior margin of the image. The remainder of the study is stable. These results will be called to the ordering clinician or representative by the Radiologist Assistant, and communication documented in the PACS or  zVision Dashboard. Electronically Signed   By: David  Martinique M.D.   On: 03/10/2017 14:55   Dg Abd Portable 1v  Result Date: 03/10/2017 CLINICAL DATA:  Assess orogastric tube placement. EXAM: PORTABLE ABDOMEN - 1 VIEW COMPARISON:  Chest x-ray of today's date FINDINGS: The orogastric tube tip in proximal port lie in the region of the gastric body. There are few loops  of mildly distended gas-filled bowel within the abdomen. The stool and gas in the rectum. IMPRESSION: The esophagogastric tube appears be appropriately positioned within the stomach. Electronically Signed   By: David  Martinique M.D.   On: 03/10/2017 14:56    Microbiology: Recent Results (from the past 240 hour(s))  Respiratory Panel by PCR     Status: None   Collection Time: 04/02/17  8:00 PM  Result Value Ref Range Status   Adenovirus NOT DETECTED NOT DETECTED Final   Coronavirus 229E NOT DETECTED NOT DETECTED Final   Coronavirus HKU1 NOT DETECTED NOT DETECTED Final   Coronavirus NL63 NOT DETECTED NOT DETECTED Final   Coronavirus OC43 NOT DETECTED NOT DETECTED Final   Metapneumovirus NOT DETECTED NOT DETECTED Final   Rhinovirus / Enterovirus NOT DETECTED NOT DETECTED Final   Influenza A NOT DETECTED NOT DETECTED Final   Influenza B NOT DETECTED NOT DETECTED Final   Parainfluenza Virus 1 NOT DETECTED NOT DETECTED Final   Parainfluenza Virus 2 NOT DETECTED NOT DETECTED Final   Parainfluenza Virus 3 NOT DETECTED NOT DETECTED Final   Parainfluenza Virus 4 NOT DETECTED NOT DETECTED Final   Respiratory Syncytial Virus NOT DETECTED NOT DETECTED Final   Bordetella pertussis NOT DETECTED NOT DETECTED Final   Chlamydophila pneumoniae NOT DETECTED NOT DETECTED Final   Mycoplasma pneumoniae NOT DETECTED NOT DETECTED Final    Comment: Performed at [facility], [place] [address] [place], [place] [address]  MRSA PCR Screening     Status: None   Collection Time: 04/02/17  8:00 PM  Result Value Ref Range Status   MRSA by PCR NEGATIVE NEGATIVE Final    Comment:        The GeneXpert MRSA Assay (FDA approved for NASAL specimens only), is one component of a comprehensive MRSA colonization surveillance program. It is not intended to diagnose MRSA infection nor to guide or monitor treatment for MRSA infections.      Labs: Basic Metabolic Panel:  Recent Labs Lab 04/02/17 1658  04/02/17 1910 04/03/17 0541 04/04/17 0412 04/06/17 0430  NA 140  --  136  --   --   K 4.6  --  4.4  --   --   CL 92*  --  89*  --   --   CO2 44*  --  40*  --   --   GLUCOSE 120*  --  112*  --   --   BUN 7  --  10  --   --   CREATININE 0.35*  --  0.45  --   --   CALCIUM 10.5*  --  10.0  --   --   MG  --  1.5*  --  1.3* 1.9  PHOS  --  3.6  --   --   --    Liver Function Tests:  Recent Labs Lab 04/02/17 1658 04/03/17 0541  AST 13* 13*  ALT 13* 11*  ALKPHOS 136* 113  BILITOT 0.6 0.7  PROT 6.2* 5.4*  ALBUMIN 3.1* 2.7*   No results for input(s): LIPASE, AMYLASE in the last 168  hours.  Recent Labs Lab 04/02/17 1658  AMMONIA 52*   CBC:  Recent Labs Lab 04/02/17 1658 04/03/17 0541 04/05/17 0544 04/06/17 0430  WBC 7.2 3.4* 8.6 7.2  NEUTROABS 6.3  --   --   --   HGB 12.0 10.7* 11.6* 11.0*  HCT 41.3 36.4 35.8* 34.9*  MCV 92.4 90.5 82.9 82.1  PLT 227 218 218 198   Cardiac Enzymes:  Recent Labs Lab 04/02/17 1658  TROPONINI <0.03   BNP: BNP (last 3 results)  Recent Labs  09/05/16 0033 10/26/16 0526  BNP 36.0 11.0    ProBNP (last 3 results) No results for input(s): PROBNP in the last 8760 hours.  CBG: No results for input(s): GLUCAP in the last 168 hours.     SignedLelon Frohlich  [facility] Pager: [phone] 04/06/2017, 11:11 AM

## 2017-04-06 NOTE — Progress Notes (Signed)
Daily Progress Note   Patient Name: Olivia Randall       Date: 04/06/2017 DOB: [birth year]  Age: 66 y.o. MRN#: [id number] Attending Physician: Isaac Bliss, [place] Primary Care Physician: Octavio Graves, DO Admit Date: 04/02/2017  Reason for Consultation/Follow-up: Establishing goals of care and Psychosocial/spiritual support  Subjective: Olivia Randall is resting quietly in bed with her sister Enid Derry at her bedside. Nursing staff report said earlier today Olivia Randall became confused, forgetting that she had cancer, and that she was going to take hospice benefits at home. I encourage staff to not continue the discussion related to cancer, cancer treatment, or hospice services to unburden Olivia Randall from the fear and grief related to remembering her cancer diagnosis.  Call from [facility] hospice who share that their goal is to provide care for Olivia Randall in a safe manner, this includes contacting adult protective services through the Department of Social Services. They share that they will likely transfer Olivia Randall to the hospice home of [facility] (although her prognosis is outside their normal guidelines) to provide optimal care for her at end-of-life.    Length of Stay: 4  Current Medications: Scheduled Meds:  . chlorhexidine  15 mL Mouth Rinse BID  . donepezil  10 mg Oral QHS  . enoxaparin (LOVENOX) injection  30 mg Subcutaneous Q24H  . feeding supplement (ENSURE ENLIVE)  237 mL Oral BID BM  . FLUoxetine  40 mg Oral Daily  . fluticasone furoate-vilanterol  1 puff Inhalation Daily  . furosemide  40 mg Oral Daily  . ipratropium-albuterol  3 mL Nebulization TID  . levofloxacin  500 mg Oral Daily  . mouth rinse  15 mL Mouth Rinse q12n4p  . methylPREDNISolone  (SOLU-MEDROL) injection  60 mg Intravenous Q12H  . pantoprazole  80 mg Oral Q1200    Continuous Infusions:   PRN Meds: albuterol, ALPRAZolam, ondansetron **OR** ondansetron (ZOFRAN) IV, oxyCODONE-acetaminophen  Physical Exam  Constitutional: No distress.  Appears frail, chronically ill, pleasantly confused, calm and cooperative, makes but does not keep eye contact.  HENT:  Head: Atraumatic.  Some temporal wasting  Cardiovascular: Normal rate and regular rhythm.   Pulmonary/Chest: No respiratory distress.  Abdominal: Soft. She exhibits no distension.  Musculoskeletal: She exhibits no edema.  Neurological: She is alert.  Oriented to self only, known dementia  Skin: Skin is warm and dry.  Nursing note and vitals reviewed.           Vital Signs: BP 133/71 (BP Location: Right Arm)   Pulse 68   Temp 98.5 F (36.9 C) (Oral)   Resp 20   Ht 5\' 4"  (1.626 m)   Wt 44.2 kg (97 lb 7.1 oz)   SpO2 98%   BMI 16.73 kg/m  SpO2: SpO2: 98 % O2 Device: O2 Device: Nasal Cannula O2 Flow Rate: O2 Flow Rate (L/min): 2 L/min  Intake/output summary:  Intake/Output Summary (Last 24 hours) at 04/06/17 1345 Last data filed at 04/05/17 1700  Gross per 24 hour  Intake              480 ml  Output                0 ml  Net              480 ml   LBM: Last BM Date: 04/02/17 Baseline Weight: Weight: 43.5 kg (95 lb 14.4 oz) Most recent weight: Weight: 44.2 kg (97 lb 7.1 oz)       Palliative Assessment/Data:    Flowsheet Rows     Most Recent Value  Intake Tab  Referral Department  Hospitalist  Unit at Time of Referral  Med/Surg Unit  Palliative Care Primary Diagnosis  Pulmonary  Date Notified  04/03/17  Palliative Care Type  New Palliative care  Reason for referral  Clarify Goals of Care  Date of Admission  04/02/17  Date first seen by Palliative Care  04/04/17  # of days Palliative referral response time  1 Day(s)  # of days IP prior to Palliative referral  1  Clinical Assessment    Palliative Performance Scale Score  30%  Pain Max last 24 hours  Not able to report  Pain Min Last 24 hours  Not able to report  Dyspnea Max Last 24 Hours  Not able to report  Dyspnea Min Last 24 hours  Not able to report  Psychosocial & Spiritual Assessment  Palliative Care Outcomes  Patient/Family meeting held?  Yes  Who was at the meeting?  patient at bedside, sister Enid Derry via phone, granddaughter/HC POA Kristen Bullins and daughter Loralyn Freshwater at bedside  [place] regarding hospice, Provided psychosocial or spiritual support, Clarified goals of care  Patient/Family wishes: Interventions discontinued/not started   Mechanical Ventilation      Patient Active Problem List   Diagnosis Date Noted  . Palliative care encounter   . Goals of care, counseling/discussion   . Encounter for hospice care discussion   . Acute on chronic respiratory failure ([place]) 04/02/2017  . Severe protein-calorie malnutrition [name]: less than 60% of standard weight) ([place]) 04/02/2017  . Malnutrition of moderate degree 03/12/2017  . Encounter for orogastric (OG) tube placement   . Encounter for intubation   . COPD exacerbation ([place]) 03/10/2017  . Pressure injury of skin 03/10/2017  . Acute respiratory failure ([place]) 01/23/2017  . Altered mental status 01/23/2017  . Normocytic anemia 01/23/2017  . Acute respiratory failure with hypercapnia ([place]) 09/05/2016  . Essential hypertension 11/08/2015  . Mucosal abnormality of stomach   . Hyponatremia 03/18/2015  . Sleep apnea   . Anxiety and depression   . Dysphagia, pharyngoesophageal phase 02/18/2015  . OSA (obstructive sleep apnea) 05/20/2013  . Tobacco use disorder 05/20/2013  . Dizzy 12/04/2012  . COPD with acute exacerbation ([place]) 12/03/2012  . Chronic respiratory  failure ([place]) 12/03/2012  . Hypokalemia 12/03/2012  . Chronic back pain 12/03/2012  . GERD (gastroesophageal reflux disease) 12/03/2012    Palliative Care  Assessment & Plan   Patient Profile: 66 y.o.femalewith past medical history of Dementia, anxiety and depression, COPD, GE RD, CHF, recent hospitalization 7/034 COPD exacerbation requiring intubationadmitted on 7/21/2018with acute on chronic respiratory failure with COPD exacerbation and suspected metastatic lung cancer with burden to liver, frailty.   Assessment: Acute on chronic respiratory failure. COPD exacerbation:   Olivia Randall was hospitalized and ventilated within the last month. She's decided that she does not want intubation/ventilation again. She is improved with her respiratory status, and there's questions about her continued smoking at home. Her respiratory viral panel was negative. She is on steroid taper, and will continue Levaquin by mouth for 5 additional days. Suspect metastatic lung cancer, Interim finding of a 2.9 cm low density mass in the left hepatic lobe: both Olivia Randall and her [facility] POA, granddaughter Cyril Mourning Bullins desire no further workup for suspected metastatic cancer burden. They are instead, wishing to focus on hospice at home.  Recommendations/Plan:  Olivia Randall does not qualify for SNF for rehab, family has requested hospice of [place] to provide in-home benefits, comfort and dignity at end-of-life. Family does not desire any further cancer workup or cancer treatments.  Goals of Care and Additional Recommendations:  Limitations on Scope of Treatment: No Chemotherapy and No Chemotherapy and No workup for cancer. Treat the treatable, such as UTIs, but no CPR or intubation.   Code Status:    Code Status Orders        Start     Ordered   04/02/17 1907  Do not attempt resuscitation (DNR)  Continuous    Question Answer Comment  In the event of cardiac or respiratory ARREST Do not call a "code blue"   In the event of cardiac or respiratory ARREST Do not perform Intubation, CPR, defibrillation or ACLS   In the event of cardiac or respiratory  ARREST Use medication by any route, position, wound care, and other measures to relive pain and suffering. May use oxygen, suction and manual treatment of airway obstruction as needed for comfort.      04/02/17 1909    Code Status History    Date Active Date Inactive Code Status Order ID Comments User Context   03/10/2017  1:50 PM 03/16/2017  7:02 AM Full Code [id number]  Germain Osgood, PA-C Inpatient   01/24/2017  2:03 AM 01/27/2017  9:10 PM Full Code [id number]  Edwin Dada, MD Inpatient   09/10/2016 12:50 PM 09/14/2016  3:00 PM Partial Code [id number]  Rush Farmer, MD Inpatient   09/05/2016  6:57 AM 09/10/2016 12:50 PM Full Code [id number]  Beverely Low, MD Inpatient   11/08/2015  5:25 PM 11/10/2015  4:54 PM Full Code [id number]  Erline Hau, MD Inpatient   03/18/2015 12:11 AM 03/20/2015  2:46 PM Full Code [id number]  Oswald Hillock, MD Inpatient   05/20/2013  9:48 PM 05/22/2013  7:18 PM Full Code [id number]  Karlyn Agee, MD Inpatient   12/03/2012  6:02 PM 12/05/2012  3:02 PM Full Code [id number]  Samuella Cota, MD Inpatient    Advance Directive Documentation     Most Recent Value  Type of Advance Directive  Healthcare Power of Attorney, Living will  Pre-existing out of facility DNR order (yellow form or pink MOST form)  -  "MOST" Form in Place?  -  Prognosis:   < 3 months would not be surprising based on frailty, functional decline, suspected metastatic lung cancer with burden to liver.  Discharge Planning:  .Family is requesting home with the benefits of hospice of [facility].   Care plan was discussed with nursing staff, case manager, social worker, and Dr. Jerilee Hoh.   Thank you for allowing the Palliative Medicine Team to assist in the care of this patient.   Time In: 1030 Time Out: 1050 Total Time 20 minutes Prolonged Time Billed  no       Greater than 50%  of this time was spent counseling and coordinating care related to the above  assessment and plan.  Drue Novel, NP  Please contact Palliative Medicine Team phone at [phone] for questions and concerns.

## 2017-04-06 NOTE — Care Management Note (Signed)
Case Management Note  Patient Details  Name: Olivia Randall MRN: [id number] Date of Birth: June 19, 1951  Subjective/Objective:                  Pt from home, lives with granddaughter. Pt has terminal lung ca. She has dementia. Palliative medicine has been working with pt/family this hospital stay. Pt has home oxygen, WC and walker pta. Pt/family agreeable to hospice services and prefer hospice of [facility].   Action/Plan: CM contacted Olean Ree, Hospice rep, who states they will assume care after DC. DC summary has been faxed. Per hospice they have ordered bed, table, WC, oxyen and neb machine from [place]. Pt does not need these delivered prior to DC as she is mobile and already has home oxygen in place. Per RN pt's daughter will provide transportation.   Expected Discharge Date:  04/06/17               Expected Discharge Plan:  [place]  In-House Referral:  Hospice / Palliative Care  Discharge planning Services  CM Consult  Post Acute Care Choice:  Hospice Choice offered to:  [facility] POA / Guardian (POA granddaughter -Economist)  [place] Arranged:   nursing [facility] Agency:  [facility]  Status of Service:  Completed, signed off  Sherald Barge, RN 04/06/2017, 2:02 PM

## 2017-04-06 NOTE — Care Management Important Message (Signed)
Important Message  Patient Details  Name: Olivia Randall MRN: [id number] Date of Birth: June 26, 1951   Medicare Important Message Given:  Yes    Sherald Barge, RN 04/06/2017, 11:28 AM

## 2017-04-06 NOTE — Telephone Encounter (Signed)
Her sister wants to let you know that Tressia has lung cancer they are placing her with hospice. She is going to go back with her Grandchildren.

## 2017-04-06 NOTE — Telephone Encounter (Signed)
The hospital summary states that she was found to have a mass in her left lung that they strongly believe is cancer. There is another area in her liver which is likely spread of cancer. Also some lesions are on the bones in her spine. This could also be spread.  The report from the doctor states that she did not want a biopsy or full treatment and therefore Hospice has been called.

## 2017-04-06 NOTE — Progress Notes (Signed)
[place] discharged to [facility] home care per MD order.  Discharge instructions reviewed and discussed with the patient and granddaughter, all questions and concerns answered. Copy of instructions and scripts given to patient. Scripts also faxed to [facility].  Allergies as of 04/06/2017      Reactions   Clopidogrel Other (See Comments)   Confusion and behavioral changes (altered)      Medication List    TAKE these medications   acetaminophen 325 MG tablet Commonly known as:  TYLENOL Take 2 tablets (650 mg total) by mouth every 6 (six) hours as needed for mild pain, moderate pain, fever or headache (or Fever >/= 101).   ALPRAZolam 0.5 MG tablet Commonly known as:  XANAX Take 1 tablet (0.5 mg total) by mouth 2 (two) times daily as needed for anxiety.   donepezil 10 MG tablet Commonly known as:  ARICEPT Take 10 mg by mouth at bedtime.   esomeprazole 40 MG capsule Commonly known as:  NEXIUM Take 1 capsule (40 mg total) by mouth daily.   FLUoxetine 40 MG capsule Commonly known as:  PROZAC Take 1-2 capsules (40-80 mg total) by mouth daily.   fluticasone furoate-vilanterol 200-25 MCG/INH Aepb Commonly known as:  BREO ELLIPTA Inhale 1 puff into the lungs daily.   furosemide 40 MG tablet Commonly known as:  LASIX Take 1 tablet (40 mg total) by mouth daily.   ipratropium-albuterol 0.5-2.5 (3) MG/3ML Soln Commonly known as:  DUONEB Take 3 mLs by nebulization 3 (three) times daily.   oxyCODONE-acetaminophen 5-325 MG tablet Commonly known as:  ROXICET Take 1 tablet by mouth every 8 (eight) hours as needed for severe pain. Chronic pain   predniSONE 10 MG tablet Commonly known as:  DELTASONE Take 1 tablet (10 mg total) by mouth daily with breakfast. Take 6 tablets today and then decrease by 1 tablet daily until none are left.        IV site discontinued and catheter remains intact. Site without signs and symptoms of complications. Dressing  and pressure applied.  Patient escorted to car by NT in a wheelchair,  no distress noted upon discharge.  Ralene Muskrat Glennis Borger 04/06/2017 5:34 PM

## 2017-04-06 NOTE — Evaluation (Signed)
Occupational Therapy Evaluation Patient Details Name: Olivia Randall MRN: [id number] DOB: [birth year] Today's Date: 04/06/2017    History of Present Illness 66 y.o.femalewith medical history significant of ancxiety/depression, COPD, GERD, HTN, MS, CHF, dementia. Patient was discharged from his current hospital on 03/15/2017 after admission for COPD exacerbation requiring intubation. Patient has gone from a skilled nursing facility/rehabilitation facility to different family members. Patient currently presenting to independent hospital from her sister's home who has been her most recent and closest caretaker. Patient presented with somnolence last several days. Patient admitted for COPD exacerbation. Palliative care consulted for suspicion for metastatic lung cancer. Family opted for conservative management   Clinical Impression   Pt received sitting up in bed, agreeable to OT evaluation. Pt reports she is feeling much improved this am. Pt demonstrates independence with B/ADL completion, supervision for functional mobility due to occasional unsteadiness. Pt will be going home with 24/7 supervision from family, has all necessary DME. No further OT needs as pt is at baseline functioning.     Follow Up Recommendations  No OT follow up;Supervision/Assistance - 24 hour    Equipment Recommendations  None recommended by OT       Precautions / Restrictions Precautions Precautions: None Restrictions Weight Bearing Restrictions: No      Mobility Bed Mobility Overal bed mobility: Modified Independent       Supine to sit: Modified independent (Device/Increase time)        Transfers Overall transfer level: Modified independent   Transfers: Sit to/from Stand Sit to Stand: Independent                  ADL either performed or assessed with clinical judgement   ADL Overall ADL's : Needs assistance/impaired Eating/Feeding: Set up;Sitting   Grooming:  Supervision/safety;Standing               Lower Body Dressing: Modified independent;Sitting/lateral leans   Toilet Transfer: Supervision/safety;Ambulation;Regular Toilet   Toileting- Clothing Manipulation and Hygiene: Modified independent;Sit to/from stand       Functional mobility during ADLs: Supervision/safety       Vision Baseline Vision/History: Wears glasses Wears Glasses: Reading only Patient Visual Report: No change from baseline Vision Assessment?: No apparent visual deficits            Pertinent Vitals/Pain Pain Assessment: No/denies pain     Hand Dominance Right   Extremity/Trunk Assessment Upper Extremity Assessment Upper Extremity Assessment: Generalized weakness   Lower Extremity Assessment Lower Extremity Assessment: Defer to PT evaluation   Cervical / Trunk Assessment Cervical / Trunk Assessment: Normal   Communication Communication Communication: No difficulties   Cognition Arousal/Alertness: Awake/alert Behavior During Therapy: WFL for tasks assessed/performed Overall Cognitive Status: History of cognitive impairments - at baseline                                                Home Living Family/patient expects to be discharged to:: Private residence Living Arrangements: Other relatives (grandchildren) Available Help at Discharge: Family Type of Home: House Home Access: Stairs to enter Technical brewer of Steps: 2 Entrance Stairs-Rails: Right Home Layout: One level     Bathroom Shower/Tub: Teacher, early years/pre: Standard     Home Equipment: Environmental consultant - 2 wheels;Cane - single point;Wheelchair - manual;Shower seat;Bedside commode;Grab bars - tub/shower          Prior  Functioning/Environment Level of Independence: Independent with assistive device(s)  Gait / Transfers Assistance Needed: mod I with RW ADL's / Homemaking Assistance Needed: pt reports independence with B/ADL tasks   Comments:  pt reports sitting to shower, able to perform self care and ambulate independently        OT Problem List: Decreased activity tolerance    AM-PAC PT "6 Clicks" Daily Activity     Outcome Measure Help from another person eating meals?: None Help from another person taking care of personal grooming?: None Help from another person toileting, which includes using toliet, bedpan, or urinal?: None Help from another person bathing (including washing, rinsing, drying)?: A Little Help from another person to put on and taking off regular upper body clothing?: None Help from another person to put on and taking off regular lower body clothing?: None 6 Click Score: 23   End of Session Equipment Utilized During Treatment: Gait belt;Oxygen  Activity Tolerance: Patient tolerated treatment well Patient left: in bed;with call bell/phone within reach;with bed alarm set  OT Visit Diagnosis: Muscle weakness (generalized) (M62.81)                Time: 9450-3888 OT Time Calculation (min): 14 min Charges:  OT General Charges $OT Visit: 1 Procedure OT Evaluation $OT Eval Low Complexity: 1 Procedure    Guadelupe Sabin, OTR/L  [phone] 04/06/2017, 8:40 AM

## 2017-05-02 ENCOUNTER — Ambulatory Visit: Payer: Medicare HMO | Admitting: Physician Assistant

## 2017-05-14 DEATH — deceased

## 2018-01-07 IMAGING — CR DG CHEST 1V PORT
1 series · 1 of 1 positions shown · non-contrast
Comparison: Multiple priors, most recently 03/13/2017

CLINICAL DATA: Shortness of breath

EXAM:
PORTABLE CHEST 1 VIEW

[portable]
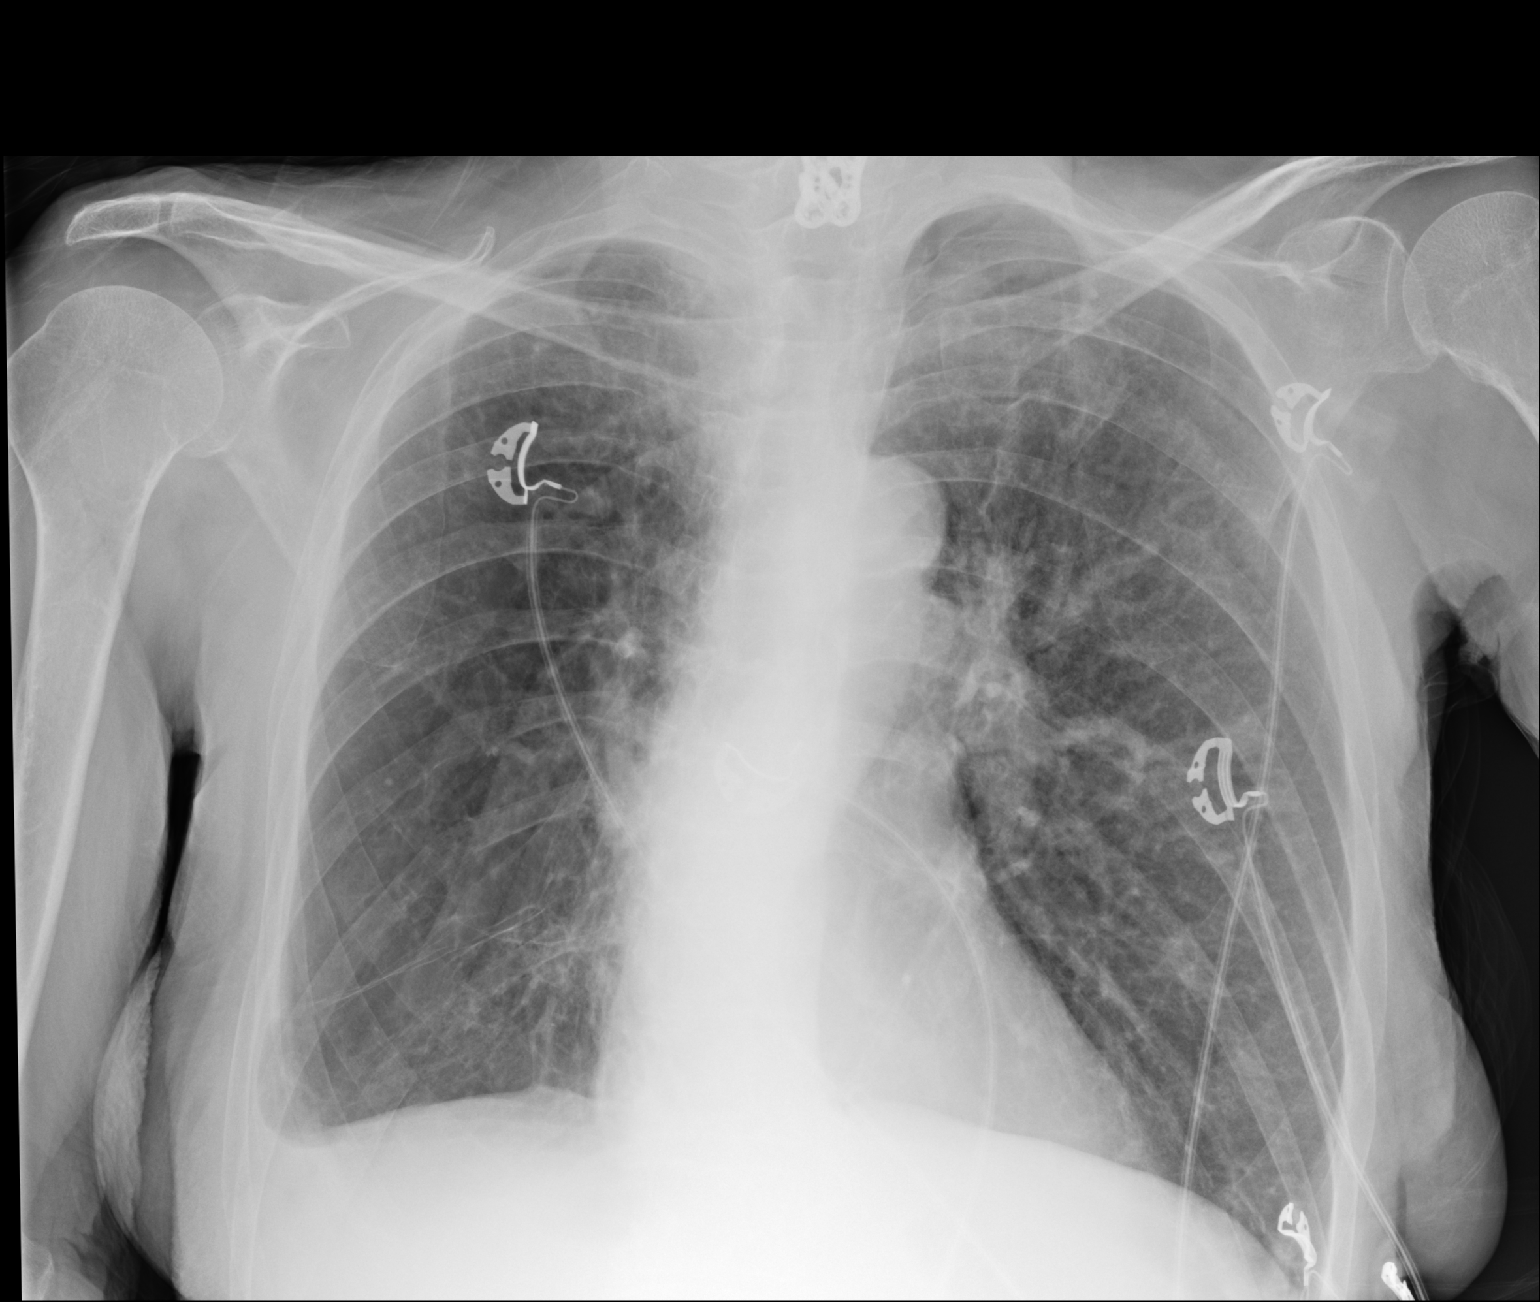

[1 of 1 positions shown; findings below may reference images not displayed]

FINDINGS: Blunting of the right costophrenic angle, possibly chronic pleural
thickening versus a small right pleural effusion. Increased
interstitial markings without frank interstitial edema. Left lung is
clear. No pneumothorax.

Heart is normal in size.

Cervical spine fixation hardware.
IMPRESSION: Chronic blunting of the right costophrenic angle, possibly pleural
thickening versus a small pleural effusion.

No evidence of acute cardiopulmonary disease.
